# Patient Record
Sex: Female | Born: 1955 | Race: White | Hispanic: No | Marital: Married | Smoking: Never smoker
Health system: Southern US, Community
[De-identification: ages and names within clinical notes are randomized; demographics above are authoritative.]

## PROBLEM LIST (undated history)

## (undated) DIAGNOSIS — F39 Unspecified mood [affective] disorder: Secondary | ICD-10-CM

## (undated) DIAGNOSIS — T7840XA Allergy, unspecified, initial encounter: Secondary | ICD-10-CM

## (undated) DIAGNOSIS — R0683 Snoring: Secondary | ICD-10-CM

## (undated) DIAGNOSIS — R7989 Other specified abnormal findings of blood chemistry: Secondary | ICD-10-CM

## (undated) DIAGNOSIS — E785 Hyperlipidemia, unspecified: Secondary | ICD-10-CM

## (undated) DIAGNOSIS — F32A Depression, unspecified: Secondary | ICD-10-CM

## (undated) DIAGNOSIS — F419 Anxiety disorder, unspecified: Secondary | ICD-10-CM

## (undated) DIAGNOSIS — R945 Abnormal results of liver function studies: Secondary | ICD-10-CM

## (undated) DIAGNOSIS — R51 Headache: Secondary | ICD-10-CM

## (undated) DIAGNOSIS — D649 Anemia, unspecified: Secondary | ICD-10-CM

## (undated) DIAGNOSIS — Z3400 Encounter for supervision of normal first pregnancy, unspecified trimester: Secondary | ICD-10-CM

## (undated) DIAGNOSIS — K760 Fatty (change of) liver, not elsewhere classified: Secondary | ICD-10-CM

## (undated) DIAGNOSIS — M1990 Unspecified osteoarthritis, unspecified site: Secondary | ICD-10-CM

## (undated) DIAGNOSIS — G4730 Sleep apnea, unspecified: Secondary | ICD-10-CM

## (undated) DIAGNOSIS — F329 Major depressive disorder, single episode, unspecified: Secondary | ICD-10-CM

## (undated) HISTORY — DX: Encounter for supervision of normal first pregnancy, unspecified trimester: Z34.00

## (undated) HISTORY — DX: Other specified abnormal findings of blood chemistry: R79.89

## (undated) HISTORY — DX: Sleep apnea, unspecified: G47.30

## (undated) HISTORY — DX: Snoring: R06.83

## (undated) HISTORY — DX: Fatty (change of) liver, not elsewhere classified: K76.0

## (undated) HISTORY — DX: Headache: R51

## (undated) HISTORY — DX: Hyperlipidemia, unspecified: E78.5

## (undated) HISTORY — DX: Unspecified osteoarthritis, unspecified site: M19.90

## (undated) HISTORY — DX: Depression, unspecified: F32.A

## (undated) HISTORY — DX: Unspecified mood (affective) disorder: F39

## (undated) HISTORY — DX: Major depressive disorder, single episode, unspecified: F32.9

## (undated) HISTORY — DX: Anxiety disorder, unspecified: F41.9

## (undated) HISTORY — DX: Anemia, unspecified: D64.9

## (undated) HISTORY — PX: TIBIA FRACTURE SURGERY: SHX806

## (undated) HISTORY — DX: Allergy, unspecified, initial encounter: T78.40XA

## (undated) HISTORY — DX: Abnormal results of liver function studies: R94.5

---

## 1993-08-28 HISTORY — PX: BREAST SURGERY: SHX581

## 1999-01-07 ENCOUNTER — Encounter: Payer: Self-pay | Admitting: Internal Medicine

## 2000-03-05 ENCOUNTER — Other Ambulatory Visit: Admission: RE | Admit: 2000-03-05 | Discharge: 2000-03-05 | Payer: Self-pay | Admitting: Internal Medicine

## 2003-10-07 ENCOUNTER — Other Ambulatory Visit: Admission: RE | Admit: 2003-10-07 | Discharge: 2003-10-07 | Payer: Self-pay | Admitting: Internal Medicine

## 2005-01-27 ENCOUNTER — Ambulatory Visit: Payer: Self-pay | Admitting: Internal Medicine

## 2006-03-05 ENCOUNTER — Ambulatory Visit: Payer: Self-pay | Admitting: Internal Medicine

## 2006-03-08 ENCOUNTER — Ambulatory Visit: Payer: Self-pay | Admitting: Internal Medicine

## 2006-03-19 ENCOUNTER — Ambulatory Visit: Payer: Self-pay | Admitting: Internal Medicine

## 2006-04-18 ENCOUNTER — Ambulatory Visit: Payer: Self-pay | Admitting: Internal Medicine

## 2006-05-18 ENCOUNTER — Ambulatory Visit: Payer: Self-pay | Admitting: Internal Medicine

## 2006-07-11 ENCOUNTER — Ambulatory Visit: Payer: Self-pay | Admitting: Internal Medicine

## 2006-08-15 ENCOUNTER — Ambulatory Visit: Payer: Self-pay | Admitting: Internal Medicine

## 2006-11-07 ENCOUNTER — Ambulatory Visit: Payer: Self-pay | Admitting: Internal Medicine

## 2006-11-07 LAB — CONVERTED CEMR LAB
Cholesterol: 246 mg/dL (ref 0–200)
Direct LDL: 159.5 mg/dL
Total CHOL/HDL Ratio: 4.1

## 2006-11-14 ENCOUNTER — Ambulatory Visit: Payer: Self-pay | Admitting: Internal Medicine

## 2007-01-16 ENCOUNTER — Ambulatory Visit: Payer: Self-pay | Admitting: Internal Medicine

## 2007-01-16 LAB — CONVERTED CEMR LAB
AST: 24 units/L (ref 0–37)
Cholesterol: 247 mg/dL (ref 0–200)
Direct LDL: 146.9 mg/dL

## 2007-01-23 ENCOUNTER — Ambulatory Visit: Payer: Self-pay | Admitting: Internal Medicine

## 2007-03-06 ENCOUNTER — Encounter: Payer: Self-pay | Admitting: Internal Medicine

## 2007-03-06 DIAGNOSIS — J309 Allergic rhinitis, unspecified: Secondary | ICD-10-CM | POA: Insufficient documentation

## 2007-03-06 DIAGNOSIS — R519 Headache, unspecified: Secondary | ICD-10-CM | POA: Insufficient documentation

## 2007-03-06 DIAGNOSIS — E785 Hyperlipidemia, unspecified: Secondary | ICD-10-CM | POA: Insufficient documentation

## 2007-03-06 DIAGNOSIS — R51 Headache: Secondary | ICD-10-CM | POA: Insufficient documentation

## 2007-05-15 ENCOUNTER — Ambulatory Visit: Payer: Self-pay | Admitting: Internal Medicine

## 2007-05-15 LAB — CONVERTED CEMR LAB
AST: 22 units/L (ref 0–37)
BUN: 10 mg/dL (ref 6–23)
CO2: 27 meq/L (ref 19–32)
Calcium: 9.3 mg/dL (ref 8.4–10.5)
Direct LDL: 157.6 mg/dL
GFR calc Af Amer: 98 mL/min
Potassium: 4.5 meq/L (ref 3.5–5.1)
Triglycerides: 116 mg/dL (ref 0–149)
VLDL: 23 mg/dL (ref 0–40)

## 2007-05-22 ENCOUNTER — Ambulatory Visit: Payer: Self-pay | Admitting: Internal Medicine

## 2007-05-22 DIAGNOSIS — M1289 Other specific arthropathies, not elsewhere classified, multiple sites: Secondary | ICD-10-CM

## 2007-07-09 ENCOUNTER — Encounter: Payer: Self-pay | Admitting: Internal Medicine

## 2007-07-31 DIAGNOSIS — E559 Vitamin D deficiency, unspecified: Secondary | ICD-10-CM | POA: Insufficient documentation

## 2007-11-19 ENCOUNTER — Ambulatory Visit: Payer: Self-pay | Admitting: Internal Medicine

## 2007-11-19 DIAGNOSIS — F4322 Adjustment disorder with anxiety: Secondary | ICD-10-CM

## 2007-11-19 DIAGNOSIS — M1990 Unspecified osteoarthritis, unspecified site: Secondary | ICD-10-CM | POA: Insufficient documentation

## 2007-11-19 DIAGNOSIS — J019 Acute sinusitis, unspecified: Secondary | ICD-10-CM

## 2008-05-15 ENCOUNTER — Ambulatory Visit: Payer: Self-pay | Admitting: Internal Medicine

## 2008-05-15 LAB — CONVERTED CEMR LAB
ALT: 21 units/L (ref 0–35)
Bilirubin, Direct: 0.1 mg/dL (ref 0.0–0.3)
CO2: 27 meq/L (ref 19–32)
Calcium: 9.2 mg/dL (ref 8.4–10.5)
Cholesterol: 241 mg/dL (ref 0–200)
Creatinine, Ser: 0.9 mg/dL (ref 0.4–1.2)
HDL: 59.1 mg/dL (ref >39.0)
Total CHOL/HDL Ratio: 4.1
Total Protein: 7.3 g/dL (ref 6.0–8.3)
Triglycerides: 116 mg/dL (ref 0–149)
Vit D, 1,25-Dihydroxy: 92 — ABNORMAL HIGH (ref 30–89)

## 2008-05-22 ENCOUNTER — Ambulatory Visit: Payer: Self-pay | Admitting: Internal Medicine

## 2008-11-11 ENCOUNTER — Telehealth: Payer: Self-pay | Admitting: Internal Medicine

## 2008-11-16 ENCOUNTER — Encounter: Payer: Self-pay | Admitting: Internal Medicine

## 2008-12-28 ENCOUNTER — Ambulatory Visit: Payer: Self-pay | Admitting: Internal Medicine

## 2009-07-01 ENCOUNTER — Ambulatory Visit: Payer: Self-pay | Admitting: Internal Medicine

## 2009-07-01 LAB — CONVERTED CEMR LAB
AST: 26 units/L (ref 0–37)
Alkaline Phosphatase: 133 units/L — ABNORMAL HIGH (ref 39–117)
Basophils Absolute: 0.1 10*3/uL (ref 0.0–0.1)
Basophils Relative: 0.9 % (ref 0.0–3.0)
Bilirubin Urine: NEGATIVE
Chloride: 108 meq/L (ref 96–112)
Cholesterol: 208 mg/dL — ABNORMAL HIGH (ref 0–200)
Eosinophils Relative: 4.4 % (ref 0.0–5.0)
HCT: 36.8 % (ref 36.0–46.0)
Hemoglobin: 12.4 g/dL (ref 12.0–15.0)
Ketones, urine, test strip: NEGATIVE
Lymphs Abs: 2.3 10*3/uL (ref 0.7–4.0)
MCV: 87 fL (ref 78.0–100.0)
Nitrite: NEGATIVE
Potassium: 3.7 meq/L (ref 3.5–5.1)
Protein, U semiquant: NEGATIVE
RBC: 4.22 M/uL (ref 3.87–5.11)
Sodium: 143 meq/L (ref 135–145)
Total Bilirubin: 0.5 mg/dL (ref 0.3–1.2)
Total CHOL/HDL Ratio: 4
Urobilinogen, UA: 0.2
VLDL: 26.8 mg/dL (ref 0.0–40.0)
WBC: 5.8 10*3/uL (ref 4.5–10.5)

## 2009-07-13 ENCOUNTER — Ambulatory Visit: Payer: Self-pay | Admitting: Internal Medicine

## 2009-07-13 ENCOUNTER — Encounter: Payer: Self-pay | Admitting: Internal Medicine

## 2009-07-13 ENCOUNTER — Other Ambulatory Visit: Admission: RE | Admit: 2009-07-13 | Discharge: 2009-07-13 | Payer: Self-pay | Admitting: Internal Medicine

## 2009-07-13 DIAGNOSIS — R748 Abnormal levels of other serum enzymes: Secondary | ICD-10-CM | POA: Insufficient documentation

## 2009-07-13 DIAGNOSIS — J358 Other chronic diseases of tonsils and adenoids: Secondary | ICD-10-CM | POA: Insufficient documentation

## 2009-07-13 DIAGNOSIS — M79609 Pain in unspecified limb: Secondary | ICD-10-CM

## 2009-07-14 ENCOUNTER — Telehealth: Payer: Self-pay | Admitting: *Deleted

## 2009-07-14 ENCOUNTER — Encounter (INDEPENDENT_AMBULATORY_CARE_PROVIDER_SITE_OTHER): Payer: Self-pay | Admitting: *Deleted

## 2009-07-15 ENCOUNTER — Ambulatory Visit: Payer: Self-pay | Admitting: Internal Medicine

## 2009-07-19 ENCOUNTER — Encounter (INDEPENDENT_AMBULATORY_CARE_PROVIDER_SITE_OTHER): Payer: Self-pay | Admitting: *Deleted

## 2009-07-26 ENCOUNTER — Ambulatory Visit: Payer: Self-pay | Admitting: Internal Medicine

## 2009-08-26 ENCOUNTER — Ambulatory Visit: Payer: Self-pay | Admitting: Internal Medicine

## 2009-08-26 LAB — CONVERTED CEMR LAB
GGT: 78 units/L — ABNORMAL HIGH (ref 7–51)
Vit D, 25-Hydroxy: 30 ng/mL (ref 30–89)

## 2009-09-01 LAB — CONVERTED CEMR LAB
AST: 43 units/L — ABNORMAL HIGH (ref 0–37)
Albumin: 3.8 g/dL (ref 3.5–5.2)
Alkaline Phosphatase: 140 units/L — ABNORMAL HIGH (ref 39–117)

## 2009-09-06 ENCOUNTER — Encounter: Admission: RE | Admit: 2009-09-06 | Discharge: 2009-09-06 | Payer: Self-pay | Admitting: Internal Medicine

## 2009-12-13 ENCOUNTER — Ambulatory Visit: Payer: Self-pay | Admitting: Internal Medicine

## 2010-01-10 LAB — CONVERTED CEMR LAB
ALT: 52 units/L — ABNORMAL HIGH (ref 0–35)
AST: 43 units/L — ABNORMAL HIGH (ref 0–37)
Bilirubin, Direct: 0 mg/dL (ref 0.0–0.3)
Total Bilirubin: 0.2 mg/dL — ABNORMAL LOW (ref 0.3–1.2)
Total Protein: 7.6 g/dL (ref 6.0–8.3)

## 2010-02-01 ENCOUNTER — Ambulatory Visit: Payer: Self-pay | Admitting: Internal Medicine

## 2010-02-01 DIAGNOSIS — E669 Obesity, unspecified: Secondary | ICD-10-CM

## 2010-02-01 DIAGNOSIS — R0609 Other forms of dyspnea: Secondary | ICD-10-CM

## 2010-02-01 DIAGNOSIS — R0989 Other specified symptoms and signs involving the circulatory and respiratory systems: Secondary | ICD-10-CM

## 2010-02-01 DIAGNOSIS — R945 Abnormal results of liver function studies: Secondary | ICD-10-CM | POA: Insufficient documentation

## 2010-09-25 LAB — CONVERTED CEMR LAB
Bilirubin Urine: NEGATIVE
Protein, U semiquant: NEGATIVE

## 2010-09-29 NOTE — Assessment & Plan Note (Signed)
Summary: fu on meds/lab work/njr   Vital Signs:  Patient profile:   55 year old female Menstrual status:  postmenopausal Height:      64.5 inches Weight:      218 pounds BMI:     36.97 Pulse rate:   88 / minute BP sitting:   110 / 60  (left arm) Cuff size:   large  Vitals Entered By: Romualdo Bolk, CMA (AAMA) (February 01, 2010 2:13 PM) CC: Follow-up visit on meds   History of Present Illness: Audrey Jenkins comes in today  for follow up of multiple medical problems .  MOOD: taking meds and needs rfill of lexapro and wellbutrin now has a job  and feels better  Teaching [employer]   Obesity and fatty liver : plans  onexercising.      diet up and down.  LIPIDS: no se of meds  Allergic rhinitis  other ent signs :  needs refill on nasones as this is helpful . Has seen ent in the past year. HAs: stable   Preventive Screening-Counseling & Management  Alcohol-Tobacco     Alcohol drinks/day: 0     Smoking Status: never     Passive Smoke Exposure: no  Caffeine-Diet-Exercise     Caffeine use/day: 5 +     Does Patient Exercise: yes     Type of exercise: walking  Current Medications (verified): 1)  Budeprion Xl 300 Mg Tb24 (Bupropion Hcl) .Marland Kitchen.. 1 By Mouth Once Daily 2)  Lipitor 80 Mg  Tabs (Atorvastatin Calcium) .Marland Kitchen.. 1 By Mouth Once Daily 3)  Relpax 40 Mg Tabs (Eletriptan Hydrobromide) .Marland Kitchen.. 1 By Mouth As Needed For Ha. Can Repeat in 2-4 Hours 4)  Lexapro 10 Mg  Tabs (Escitalopram Oxalate) .Marland Kitchen.. 1 By Mouth Once Daily 5)  Nasonex 50 Mcg/act Susp (Mometasone Furoate) .... 2 Sprays Each Nares Q D 6)  Frova 2.5 Mg Tabs (Frovatriptan Succinate)  Allergies (verified): 1)  ! Flonase (Fluticasone Propionate) 2)  ! Darvocet A500 3)  ! Zyrtec Allergy (Cetirizine Hcl) 4)  ! Pravachol  Past History:  Past medical, surgical, family and social histories (including risk factors) reviewed, and no changes noted (except as noted below).  Past Medical History: Allergic  rhinitis Headache Mood Hyperlipidemia  Inital 400s  Osteoarthritis   Primiparous Treatment for depression Fatty liver on [place] Jan 2011 abnormal lfts  Snoring  [facility] Jan 2011  nasal obstruction and  cryptic tonsils  Past Surgical History: Reviewed history from 07/13/2009 and no changes required. Right Breast Biopsy 1995  Past History:  Care Management: None Current ENT   [facility]  Family History: Reviewed history from 07/13/2009 and no changes required. sister had sleep apnea and improved with weight loss   Father: Mi 79  cabg ht chf   died 49  Mother: Endometrial and skin cancer .  DJD . Osteoporosis and dementia. , COPD age 64.  Siblings: SIS  older CAD  vasculitis,  other sis DJD.  MGP  coronary artery disease   Social History: Reviewed history from 07/13/2009 and no changes required. Never Smoked Married   JOb at [employer] ends ofin JUne 10  RIF    now with new job.  [facility] H[facility] of 2   pet kittens. Works with [employer].    Review of Systems  The patient denies anorexia, fever, weight loss, vision loss, decreased hearing, hoarseness, chest pain, syncope, dyspnea on exertion, peripheral edema, prolonged cough, abdominal pain, melena, hematochezia, hematuria,  muscle weakness, transient blindness, difficulty walking, abnormal bleeding, enlarged lymph nodes, and angioedema.    Physical Exam  General:  Well-developed,well-nourished,in no acute distress; alert,appropriate and cooperative throughout examination Head:  normocephalic and atraumatic.   Eyes:  vision grossly intact, pupils equal, and pupils round.   Neck:  No deformities, masses, or tenderness noted. Lungs:  Normal respiratory effort, chest expands symmetrically. Lungs are clear to auscultation, no crackles or wheezes.no dullness.   Heart:  Normal rate and regular rhythm. S1 and S2 normal without gallop, murmur, click, rub or other extra sounds.no lifts.   Abdomen:  soft, non-tender, no  hepatomegaly, and no splenomegaly.  no distention, no masses, and no guarding.   Msk:  no joint warmth and no redness over joints.   Pulses:  pulses intact without delay   Extremities:  no clubbing cyanosis or edema  Neurologic:  alert & oriented X3 and gait normal.   Skin:  turgor normal, color normal, no ecchymoses, and no petechiae.   Cervical Nodes:  No lymphadenopathy noted Psych:  Oriented X3, normally interactive, good eye contact, not anxious appearing, and not depressed appearing.  brighter affect    Impression & Recommendations:  Problem # 1:  LIVER FUNCTION TESTS, ABNORMAL (ICD-794.8)  ultrasound   CW Fatty liver    counseled   labs . 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  Problem # 4:  HYPERLIPIDEMIA (ICD-272.4) pre rx  was in the 400 range  Her updated medication list for this problem includes:    Lipitor 80 Mg Tabs (Atorvastatin calcium) .Marland Kitchen... 1 by mouth once daily  Labs Reviewed: SGOT: 43 (12/13/2009)   SGPT: 52 (12/13/2009)  Lipid Goals: Chol Goal: 200 (05/22/2007)   HDL Goal: 40 (05/22/2007)   LDL Goal: 160 (05/22/2007)   TG Goal: 150 (05/22/2007)  Prior 10 Yr Risk Heart Disease: Not enough information (05/22/2007)   HDL:54.60 (07/01/2009), 59.1 (05/15/2008)  LDL:DEL (05/15/2008), DEL (05/15/2007)  Chol:208 (07/01/2009), 241 (05/15/2008)  Trig:134.0 (07/01/2009), 116 (05/15/2008)  Problem # 5:  OBESITY (ICD-278.00)  counseled   Ht: 64.5 (02/01/2010)   Wt: 218 (02/01/2010)   BMI: 36.97 (02/01/2010)  Problem # 6:  ALLERGIC RHINITIS (ICD-477.9) continue meds   Her updated medication list for this problem includes:    Nasonex 50 Mcg/act Susp (Mometasone furoate) .Marland Kitchen... 2 sprays each nares q d  Complete Medication  List: 1)  Budeprion Xl 300 Mg Tb24 (Bupropion hcl) .Marland Kitchen.. 1 by mouth once daily 2)  Lipitor 80 Mg Tabs (Atorvastatin calcium) .Marland Kitchen.. 1 by mouth once daily 3)  Relpax 40 Mg Tabs (Eletriptan hydrobromide) .Marland Kitchen.. 1 by mouth as needed for ha. can repeat in 2-4 hours 4)  Lexapro 10 Mg Tabs (Escitalopram oxalate) .Marland Kitchen.. 1 by mouth once daily 5)  Nasonex 50 Mcg/act Susp (Mometasone furoate) .... 2 sprays each nares q d 6)  Frova 2.5 Mg Tabs (Frovatriptan succinate)  Patient Instructions: 1)  agree that  weight   loss will help your  fatty liver and overall health. 2)  Get sleep exercise   and  weight watchers may help. 3)  Recorde eating  sleep patterns over a few weeks  to help with behaviour change . 4)  Schedule CPX with labs  in  October  2011  5)  can try taking half does of lexaproi 5 mg per day )   after  3-4 weeks can consider taking every other day 5 mg   and stop  Prescriptions: NASONEX 50 MCG/ACT SUSP (MOMETASONE FUROATE) 2 sprays each nares q d  #1 x 6   Entered and Authorized by:   Madelin Headings MD   Signed by:   Madelin Headings MD on 02/01/2010   Method used:   Electronically to        [facility]  [address] [id number] (retail)       [address]       [place], [place]  [address]       Ph: [phone] or [phone]       Fax: [phone]   RxID:   [id number] LEXAPRO 10 MG  TABS (ESCITALOPRAM OXALATE) 1 by mouth once daily  #30 Tablet x 6   Entered and Authorized by:   Madelin Headings MD   Signed by:   Madelin Headings MD on 02/01/2010   Method used:   [facility]        [facility]  [id number] (retail[facility]       [address]       [place], [place]  [address]       Ph: [phone] or [phone]       Fax: [phone]   RxID:   [id number] BUDEPRION XL 300 MG TB24 (BUPROPION HCL) 1 by mouth once daily  #30 Tablet x 6   Entered and Authorized by:   Madelin Headings MD   Signed by:   Madelin Headings MD on 02/01/2010   Method used:   [facility]        [facility]  [id number]       [address]       [place], [place]  [address]       Ph: [phone] or [phone]       Fax: [phone]   RxID:   [id number]

## 2010-10-21 ENCOUNTER — Other Ambulatory Visit: Payer: Self-pay | Admitting: Internal Medicine

## 2010-10-21 NOTE — Telephone Encounter (Signed)
Rx sent to pharmacy and letter sent to pt. 

## 2010-10-24 ENCOUNTER — Other Ambulatory Visit: Payer: Self-pay | Admitting: Internal Medicine

## 2010-10-24 NOTE — Telephone Encounter (Signed)
I will deny everyone but the relpax.

## 2010-10-25 NOTE — Telephone Encounter (Signed)
Last note in June said to get cpx with labs in November  Please have  Her schedule this and refill all  meds x 1 month until she can be seen.

## 2010-10-25 NOTE — Telephone Encounter (Signed)
Letter sent.

## 2010-10-28 ENCOUNTER — Telehealth: Payer: Self-pay | Admitting: Internal Medicine

## 2010-10-28 NOTE — Telephone Encounter (Signed)
Pt was seen in 01-2010 at that time pt was suppose to sch for cpx/cpx labs for 05-2010. Pt stated doc was going to order extensive cpx labs. Please advise

## 2010-11-04 NOTE — Telephone Encounter (Signed)
CPX  labs plus GammaGGT , Antimitochondrial antibodies and ANA.  Dx   Elevated Alkaline phosphate Then CPX  .

## 2010-11-10 NOTE — Telephone Encounter (Signed)
lmom at wk# for pt to callback

## 2010-11-23 ENCOUNTER — Encounter: Payer: Self-pay | Admitting: Internal Medicine

## 2010-11-23 NOTE — Telephone Encounter (Signed)
PT IS [facility] FOR CPX LABS ON 11-25-2010

## 2010-11-25 ENCOUNTER — Ambulatory Visit (INDEPENDENT_AMBULATORY_CARE_PROVIDER_SITE_OTHER): Payer: BC Managed Care – PPO | Admitting: Internal Medicine

## 2010-11-25 ENCOUNTER — Encounter: Payer: Self-pay | Admitting: Internal Medicine

## 2010-11-25 VITALS — BP 100/70 | HR 60 | Ht 64.25 in | Wt 212.0 lb

## 2010-11-25 DIAGNOSIS — F4322 Adjustment disorder with anxiety: Secondary | ICD-10-CM

## 2010-11-25 DIAGNOSIS — Z0000 Encounter for general adult medical examination without abnormal findings: Secondary | ICD-10-CM

## 2010-11-25 DIAGNOSIS — R945 Abnormal results of liver function studies: Secondary | ICD-10-CM

## 2010-11-25 DIAGNOSIS — R748 Abnormal levels of other serum enzymes: Secondary | ICD-10-CM

## 2010-11-25 DIAGNOSIS — E785 Hyperlipidemia, unspecified: Secondary | ICD-10-CM

## 2010-11-25 LAB — CBC WITH DIFFERENTIAL/PLATELET
Basophils Relative: 0.7 % (ref 0.0–3.0)
Eosinophils Relative: 2.8 % (ref 0.0–5.0)
HCT: 36.2 % (ref 36.0–46.0)
Hemoglobin: 12.1 g/dL (ref 12.0–15.0)
Lymphs Abs: 2.3 10*3/uL (ref 0.7–4.0)
MCV: 86.3 fl (ref 78.0–100.0)
Monocytes Absolute: 0.5 10*3/uL (ref 0.1–1.0)
Monocytes Relative: 8 % (ref 3.0–12.0)
Platelets: 298 10*3/uL (ref 150.0–400.0)
RBC: 4.19 Mil/uL (ref 3.87–5.11)
WBC: 6 10*3/uL (ref 4.5–10.5)

## 2010-11-25 LAB — HEPATIC FUNCTION PANEL
ALT: 22 U/L (ref 0–35)
Total Bilirubin: 0.8 mg/dL (ref 0.3–1.2)
Total Protein: 7.4 g/dL (ref 6.0–8.3)

## 2010-11-25 LAB — LDL CHOLESTEROL, DIRECT: Direct LDL: 177.6 mg/dL

## 2010-11-25 LAB — BASIC METABOLIC PANEL
BUN: 13 mg/dL (ref 6–23)
CO2: 27 mEq/L (ref 19–32)
Chloride: 107 mEq/L (ref 96–112)
GFR: 64.57 mL/min (ref >60.00)
Glucose, Bld: 77 mg/dL (ref 70–99)
Potassium: 5 mEq/L (ref 3.5–5.1)
Sodium: 144 mEq/L (ref 135–145)

## 2010-11-25 LAB — LIPID PANEL
HDL: 66.3 mg/dL (ref >39.00)
Total CHOL/HDL Ratio: 4
VLDL: 20.6 mg/dL (ref 0.0–40.0)

## 2010-11-25 MED ORDER — ATORVASTATIN CALCIUM 80 MG PO TABS: 80.0000 mg | ORAL_TABLET | Freq: Every day | ORAL | Status: DC

## 2010-11-25 MED ORDER — BUPROPION HCL ER (XL) 150 MG PO TB24: 150.0000 mg | ORAL_TABLET | ORAL | Status: DC

## 2010-11-25 MED ORDER — ELETRIPTAN HYDROBROMIDE 40 MG PO TABS: 40.0000 mg | ORAL_TABLET | ORAL | Status: DC | PRN

## 2010-11-25 MED ORDER — MOMETASONE FUROATE 50 MCG/ACT NA SUSP: 2.0000 | Freq: Every day | NASAL | Status: DC

## 2010-11-25 MED ORDER — ESCITALOPRAM OXALATE 10 MG PO TABS: 10.0000 mg | ORAL_TABLET | Freq: Every day | ORAL | Status: DC

## 2010-11-25 NOTE — Progress Notes (Signed)
  Subjective:    Patient ID: Audrey Jenkins, female    DOB: 12/06/1955, 55 y.o.   MRN: [id number]  HPI  Patient comes in for med check she hasn't been seen in a year. She isn't doing badly and no major change in her health status however has had issues with her medications. Anxiety depression Tried to wean off med for depression  In August and felt unraveled  A bit.  And so went back on.   Felt anxious and  So went back go off.  Had a crying spell. Is doing better back on it although when she has a stressful day at work does tend to overeat. Weight obesity  had lost some weight and gained some of that back recently. Hyperlipidemia:  No side effects from medicine although when she exercises she does get some slight aching but thinks it might be from deconditioning.   Tonsil problem: Sob Dr. Ezzard Standing last year and was going to her tonsils removed but didn't have time with her new job. She has intermittent sore throats and some snoring and think she would do better if she goes ahead with doing the tonsillectomy. Allergic rhinitis: Stable need to refill medication  h a:  Less frequent but still needs a refill of the Relpax in case she needs it. Abnormal liver tests: Never had followup we'll repeat with extra today.   Review of Systems No cp sob. No changes in sleep has some joint aching at times with her knees but is generally doing well no chest pain shortness of breath major changes in vision or hearing.    Objective:   Physical Exam Well-developed well-nourished in no acute distress. HEENT is grossly normal Next without masses thyromegaly or bruit Chest:  Clear to A&P without wheezes rales or rhonchi CV:  S1-S2 no gallops or murmurs peripheral perfusion is normal Abdomen:  Sof,t normal bowel sounds without hepatosplenomegaly, no guarding rebound or masses no CVA tenderness No clubbing cyanosis or edema      Assessment & Plan:  Hyperlipidemia  had very high total cholesterol to start and is  on high-dose think she has no side effects we'll consider decreasing to 40 mg to avoid possible side effects over time. We'll see what her labs are first. Reactive mood disorder actually doing well had some relapse when tried to wean last year. Discussed options here we may decrease the Wellbutrin first because it tends to be more of an anxiety reaction and depression and anger from there. History of abnormal liver tests these with alkaline phosphatase recheck Headaches stable

## 2010-11-25 NOTE — Patient Instructions (Signed)
Change the Wellbutrin dose to 150 per day  After a month call about how you are doing and if ok may discontinue this and then eventually try to decrease to 5 mg of lexapro.   Consider weight watchers. Will notify you  of labs when available.  And then plan    follow up .

## 2010-11-27 ENCOUNTER — Encounter: Payer: Self-pay | Admitting: Internal Medicine

## 2010-11-27 NOTE — Assessment & Plan Note (Signed)
Fatty liver on Ultrasound  Jan 2011     On lipitor  High dose for LIPiDs of 400 range   No se otherwise  Will recheck

## 2010-11-27 NOTE — Assessment & Plan Note (Signed)
Originally in 64s  Family hx of CAD father MI at 38   Can consider dec to 40 mg if appropriate to dec se although seems to not have any.

## 2010-12-01 ENCOUNTER — Telehealth: Payer: Self-pay | Admitting: *Deleted

## 2010-12-01 NOTE — Telephone Encounter (Signed)
Message copied by Tor Netters on Thu Dec 01, 2010  1:06 PM ------      Message from: [facility] [place] [facility]      Created: Thu Dec 01, 2010 11:56 AM       Advise patient that her liver tests are improved although not totally normal.      Her cholesterol levels have gone up a bit.  She should intensify with healthy lifestyle.       However if it hasn't been tried before we may want to try Crestor to see if we get a better response on her cholesterol.      Would try 20 mg one by mouth daily but I do not know if her insurance covers the period      If she wants to try a change I would repeat her lipid tests in 2 months with liver tests.      Otherwise  recheck her lipid panel and LFTs in 6 months and then an office visit

## 2010-12-01 NOTE — Telephone Encounter (Signed)
Left message on machine to call back about results.Marland Kitchen

## 2010-12-06 NOTE — Telephone Encounter (Signed)
Left message to call back  

## 2010-12-06 NOTE — Telephone Encounter (Signed)
Pt aware of results. She is going to increase a healthy lifestyle for now and discuss this when she comes in for her cpx in May.

## 2011-01-25 ENCOUNTER — Encounter: Payer: Self-pay | Admitting: Internal Medicine

## 2011-01-25 ENCOUNTER — Ambulatory Visit (INDEPENDENT_AMBULATORY_CARE_PROVIDER_SITE_OTHER): Payer: BC Managed Care – PPO | Admitting: Internal Medicine

## 2011-01-25 VITALS — BP 120/80 | HR 60 | Ht 64.5 in | Wt 216.0 lb

## 2011-01-25 DIAGNOSIS — F4322 Adjustment disorder with anxiety: Secondary | ICD-10-CM

## 2011-01-25 DIAGNOSIS — Z0000 Encounter for general adult medical examination without abnormal findings: Secondary | ICD-10-CM

## 2011-01-25 DIAGNOSIS — J358 Other chronic diseases of tonsils and adenoids: Secondary | ICD-10-CM

## 2011-01-25 DIAGNOSIS — Z23 Encounter for immunization: Secondary | ICD-10-CM

## 2011-01-25 DIAGNOSIS — E785 Hyperlipidemia, unspecified: Secondary | ICD-10-CM

## 2011-01-25 DIAGNOSIS — R945 Abnormal results of liver function studies: Secondary | ICD-10-CM

## 2011-01-25 DIAGNOSIS — R748 Abnormal levels of other serum enzymes: Secondary | ICD-10-CM

## 2011-01-25 MED ORDER — BUPROPION HCL ER (XL) 150 MG PO TB24: 150.0000 mg | ORAL_TABLET | ORAL | Status: DC

## 2011-01-25 MED ORDER — FROVATRIPTAN SUCCINATE 2.5 MG PO TABS: 2.5000 mg | ORAL_TABLET | ORAL | Status: DC | PRN

## 2011-01-25 NOTE — Patient Instructions (Signed)
lifestyle intervention healthy eating and exercise .  No changing  meds for now except try the frova  again.  LFTS  In 4-6 months  Cpx with labs in a year .

## 2011-01-25 NOTE — Progress Notes (Signed)
Subjective:    Patient ID: Audrey Jenkins, female    DOB: 1955/09/22, 55 y.o.   MRN: [id number]  HPI Patient comes in for preventive visit and    multiple medical issues  No major change in health status since last visit .No injuries .   Helping with sick family member  LIPIDS: no se of meds . Could eat better  Mood stable on meds so far  Allergies: Stable  OA joint pains take meds prn. Hands   Review of Systems Intermittent throat infection has seen ent in past and plans tonsillectomy when possible. Neg cp sob no change gi gu . Rest neg.  Past Medical History  Diagnosis Date  . Allergy   . Depression   . Headache   . Mood disorder   . Hyperlipidemia     Inital 400's  . Osteoarthritis   . Primiparous   . Fatty liver     on [place] Jan 2011  . Abnormal LFTs   . Snoring     ENT [facility] Jan 2011 nasal obstruction and cryptic tonsils   Past Surgical History  Procedure Date  . Breast surgery 1995    bx    reports that she has never smoked. She does not have any smokeless tobacco history on file. She reports that she drinks alcohol. She reports that she does not use illicit drugs. family history includes Arthritis in her mother and sister; COPD in her mother; Coronary artery disease in her sister; Dementia in her mother; Endometrial cancer in her mother; Heart attack in her father; Heart disease in her father; Heart failure in her father; Hypertension in her father; Osteoporosis in her mother; Other in her mother; Skin cancer in her mother; Sleep apnea in her sister; and Vasculitis in her sister. Allergies  Allergen Reactions  . Cetirizine Hcl     REACTION: flu symptoms  . Fluticasone Propionate     REACTION: lesions in nose  . Pravastatin Sodium     REACTION: Diarrhea  . Propoxyphene N-Acetaminophen     REACTION: extreme nausea        Objective:   Physical Exam Physical Exam: Vital signs reviewed [id number] is a well-developed well-nourished alert cooperative  white female  who appears her stated age in no acute distress.  HEENT: normocephalic  traumatic , Eyes: PERRL EOM's full, conjunctiva clear, Nares: paten,t no deformity discharge or tenderness., Ears: no deformity EAC's clear TMs with normal landmarks. Mouth: clear OP, no lesions, edema.  Cryptic tonsils  Moist mucous membranes. Dentition in adequate repair. NECK: supple without masses, thyromegaly or bruits. CHEST/PULM:  Clear to auscultation and percussion breath sounds equal no wheeze , rales or rhonchi. No chest wall deformities or tenderness. Breast: normal by inspection . No dimpling, discharge, masses, tenderness or discharge . LN: no cervical axillary inguinal adenopathy CV: PMI is nondisplaced, S1 S2 no gallops, murmurs, rubs. Peripheral pulses are full without delay.No JVD .  ABDOMEN: Bowel sounds normal nontender  No guard or rebound, no hepato splenomegal no CVA tenderness.  No hernia. Extremtities:  No clubbing cyanosis or edema, no acute joint swelling or redness no focal atrophy oa changes in hands NEURO:  Oriented x3, cranial nerves 3-12 appear to be intact, no obvious focal weakness,gait within normal limits no abnormal reflexes or asymmetrical SKIN: No acute rashes normal turgor, color, no bruising or petechiae. PSYCH: Oriented, good eye contact, no obvious depression anxiety, cognition and judgment appear normal. Labs reviewed      Assessment &  Plan:  Preventive Health Care Counseled regarding healthy nutrition, exercise, sleep, injury prevention, calcium vit d and healthy weight .  LIPIDS   orig in 300- 400 range  Adequate reduction and has no se by hx  Mood stable OA meds as needed Abnormal lfts  Improved  Will follow .  Has hx of fatty liver on [place].  Obesity Recurrent throat infections  To get tonsillectomy when  convenient

## 2011-01-27 ENCOUNTER — Encounter: Payer: Self-pay | Admitting: Internal Medicine

## 2011-02-14 ENCOUNTER — Other Ambulatory Visit: Payer: Self-pay | Admitting: Internal Medicine

## 2011-06-20 ENCOUNTER — Other Ambulatory Visit: Payer: Self-pay | Admitting: Internal Medicine

## 2011-06-20 NOTE — Telephone Encounter (Signed)
Pls advise.  

## 2011-06-20 NOTE — Telephone Encounter (Signed)
Ok to refill for x 6 months

## 2011-07-19 ENCOUNTER — Other Ambulatory Visit (INDEPENDENT_AMBULATORY_CARE_PROVIDER_SITE_OTHER): Payer: BC Managed Care – PPO

## 2011-07-19 DIAGNOSIS — R945 Abnormal results of liver function studies: Secondary | ICD-10-CM

## 2011-07-19 DIAGNOSIS — R7989 Other specified abnormal findings of blood chemistry: Secondary | ICD-10-CM

## 2011-07-19 LAB — HEPATIC FUNCTION PANEL
Albumin: 4 g/dL (ref 3.5–5.2)
Total Protein: 7.6 g/dL (ref 6.0–8.3)

## 2011-08-01 NOTE — Progress Notes (Signed)
Quick Note:  Left message to call back. ______ 

## 2011-08-07 NOTE — Progress Notes (Signed)
Quick Note:  Left message to call back. ______ 

## 2011-08-08 NOTE — Progress Notes (Signed)
Quick Note:  Left message to call back. ______ 

## 2011-08-10 ENCOUNTER — Encounter: Payer: Self-pay | Admitting: *Deleted

## 2011-08-10 NOTE — Progress Notes (Signed)
Quick Note:    Letter sent to pt.  ______

## 2011-08-18 ENCOUNTER — Other Ambulatory Visit: Payer: Self-pay | Admitting: Internal Medicine

## 2011-11-03 ENCOUNTER — Telehealth: Payer: Self-pay | Admitting: *Deleted

## 2011-11-03 NOTE — Telephone Encounter (Signed)
There was a Ship broker for pt's relpax. It allowed her to get one extra refill on December 17, 2010.  [phone]- to call if you have any questions.

## 2011-12-24 ENCOUNTER — Other Ambulatory Visit: Payer: Self-pay | Admitting: Internal Medicine

## 2012-01-20 ENCOUNTER — Other Ambulatory Visit: Payer: Self-pay | Admitting: Internal Medicine

## 2012-01-24 ENCOUNTER — Other Ambulatory Visit (INDEPENDENT_AMBULATORY_CARE_PROVIDER_SITE_OTHER): Payer: BC Managed Care – PPO

## 2012-01-24 DIAGNOSIS — Z0000 Encounter for general adult medical examination without abnormal findings: Secondary | ICD-10-CM

## 2012-01-24 LAB — BASIC METABOLIC PANEL
BUN: 16 mg/dL (ref 6–23)
CO2: 28 mEq/L (ref 19–32)
Chloride: 107 mEq/L (ref 96–112)
Creatinine, Ser: 0.8 mg/dL (ref 0.4–1.2)
Potassium: 4.6 mEq/L (ref 3.5–5.1)

## 2012-01-24 LAB — HEPATIC FUNCTION PANEL
Albumin: 3.8 g/dL (ref 3.5–5.2)
Total Bilirubin: 0.6 mg/dL (ref 0.3–1.2)

## 2012-01-24 LAB — POCT URINALYSIS DIPSTICK
Bilirubin, UA: NEGATIVE
Ketones, UA: NEGATIVE
Protein, UA: NEGATIVE
Spec Grav, UA: 1.02
pH, UA: 7

## 2012-01-24 LAB — CBC WITH DIFFERENTIAL/PLATELET
Basophils Relative: 0.8 % (ref 0.0–3.0)
Eosinophils Relative: 2.7 % (ref 0.0–5.0)
HCT: 36.7 % (ref 36.0–46.0)
Hemoglobin: 12 g/dL (ref 12.0–15.0)
Lymphs Abs: 2.2 10*3/uL (ref 0.7–4.0)
MCV: 85.9 fl (ref 78.0–100.0)
Monocytes Absolute: 0.5 10*3/uL (ref 0.1–1.0)
Monocytes Relative: 7.6 % (ref 3.0–12.0)
RBC: 4.27 Mil/uL (ref 3.87–5.11)
WBC: 6.6 10*3/uL (ref 4.5–10.5)

## 2012-01-24 LAB — LIPID PANEL
Cholesterol: 215 mg/dL — ABNORMAL HIGH (ref 0–200)
HDL: 64.3 mg/dL (ref >39.00)
Triglycerides: 160 mg/dL — ABNORMAL HIGH (ref 0.0–149.0)

## 2012-01-31 ENCOUNTER — Ambulatory Visit (INDEPENDENT_AMBULATORY_CARE_PROVIDER_SITE_OTHER): Payer: BC Managed Care – PPO | Admitting: Internal Medicine

## 2012-01-31 ENCOUNTER — Encounter: Payer: Self-pay | Admitting: Internal Medicine

## 2012-01-31 ENCOUNTER — Other Ambulatory Visit (HOSPITAL_COMMUNITY)
Admission: RE | Admit: 2012-01-31 | Discharge: 2012-01-31 | Disposition: A | Discharge status: Home / Self Care | Payer: BC Managed Care – PPO | Source: Ambulatory Visit | Attending: Internal Medicine | Admitting: Internal Medicine

## 2012-01-31 VITALS — BP 118/76 | HR 69 | Temp 97.9°F | Ht 64.5 in | Wt 222.0 lb

## 2012-01-31 DIAGNOSIS — Z01419 Encounter for gynecological examination (general) (routine) without abnormal findings: Secondary | ICD-10-CM | POA: Insufficient documentation

## 2012-01-31 DIAGNOSIS — F4322 Adjustment disorder with anxiety: Secondary | ICD-10-CM

## 2012-01-31 DIAGNOSIS — Z0000 Encounter for general adult medical examination without abnormal findings: Secondary | ICD-10-CM

## 2012-01-31 DIAGNOSIS — M1990 Unspecified osteoarthritis, unspecified site: Secondary | ICD-10-CM

## 2012-01-31 DIAGNOSIS — E785 Hyperlipidemia, unspecified: Secondary | ICD-10-CM

## 2012-01-31 DIAGNOSIS — E669 Obesity, unspecified: Secondary | ICD-10-CM

## 2012-01-31 DIAGNOSIS — Z1159 Encounter for screening for other viral diseases: Secondary | ICD-10-CM | POA: Insufficient documentation

## 2012-01-31 MED ORDER — ATORVASTATIN CALCIUM 80 MG PO TABS: 80.0000 mg | ORAL_TABLET | Freq: Every day | ORAL | Status: DC

## 2012-01-31 MED ORDER — BUPROPION HCL ER (XL) 150 MG PO TB24: 150.0000 mg | ORAL_TABLET | ORAL | Status: DC

## 2012-01-31 MED ORDER — ESCITALOPRAM OXALATE 10 MG PO TABS: 10.0000 mg | ORAL_TABLET | Freq: Every day | ORAL | Status: DC

## 2012-01-31 NOTE — Progress Notes (Signed)
Subjective:    Patient ID: Audrey Jenkins, female    DOB: 1956-03-06, 56 y.o.   MRN: [id number]  HPI Patient comes in today for preventive visit and follow-up of medical issues. Update  history since  last visit: No major changes ; ,injury surgery or hospitalizations. hasn't lost weight or attendee to this.  LIPIDS no se of meds Joint pains takes med prn HAs stable ocass frova. Allergies  Stable on nasonex. Mood  Doing well on current lexapro and wellbutrin. Last pap 2010 no hx of abnormal and low risk and is menopausal.   Review of Systems ROS:  GEN/ HEENT: No fever, significant weight changes sweats headaches vision problems hearing changes, CV/ PULM; No chest pain shortness of breath cough, syncope,edema  change in exercise tolerance. GI /GU: No adominal pain, vomiting, change in bowel habits. No blood in the stool. No significant GU symptoms. SKIN/HEME: ,no acute skin rashes suspicious lesions or bleeding. No lymphadenopathy, nodules, masses.  NEURO/ PSYCH:  No neurologic signs such as weakness numbness. No depression anxiety. IMM/ Allergy: No unusual infections.  Allergy .   REST of 12 system review negative except as per HPI Past history family history social history reviewed in the electronic medical record. Outpatient Encounter Prescriptions as of 01/31/2012  Medication Sig Dispense Refill  . atorvastatin (LIPITOR) 80 MG tablet Take 1 tablet (80 mg total) by mouth daily.  30 tablet  11  . buPROPion (WELLBUTRIN XL) 150 MG 24 hr tablet Take 1 tablet (150 mg total) by mouth every morning.  30 tablet  11  . escitalopram (LEXAPRO) 10 MG tablet Take 1 tablet (10 mg total) by mouth daily.  30 tablet  11  . FROVA 2.5 MG tablet TAKE 1 TABLET BY MOUTH AS NEEDED FOR MIGRAINE, THEN MAY REPEAT AFTER 2 HRS IF NEEDED, MAX 3/24 HRS  8 tablet  2  . NASONEX 50 MCG/ACT nasal spray USE 2 SPRAYS IN NOSTRIL DAILY  17 g  4  . DISCONTD: atorvastatin (LIPITOR) 80 MG tablet TAKE 1 TABLET BY MOUTH EVERY  DAY  30 tablet  5  . DISCONTD: buPROPion (WELLBUTRIN XL) 150 MG 24 hr tablet Take 1 tablet (150 mg total) by mouth every morning.  30 tablet  11  . DISCONTD: buPROPion (WELLBUTRIN XL) 150 MG 24 hr tablet Take 150 mg by mouth every morning.      Marland Kitchen DISCONTD: escitalopram (LEXAPRO) 10 MG tablet TAKE 1 TABLET BY MOUTH DAILY.  30 tablet  0        Objective:   Physical Exam BP 118/76  Pulse 69  Temp(Src) 97.9 F (36.6 C) (Oral)  Ht 5' 4.5" (1.638 m)  Wt 222 lb (100.699 kg)  BMI 37.52 kg/m2  SpO2 98% Physical Exam: Vital signs reviewed [id number] is a well-developed well-nourished alert cooperative  white female who appears her stated age in no acute distress.  HEENT: normocephalic atraumatic , Eyes: PERRL EOM's full, conjunctiva clear, Nares: paten,t no deformity discharge or tenderness., Ears: no deformity EAC's clear TMs with normal landmarks. Mouth: clear OP, no lesions, edema.  Moist mucous membranes. Dentition in adequate repair. NECK: supple without masses, thyromegaly or bruits. CHEST/PULM:  Clear to auscultation and percussion breath sounds equal no wheeze , rales or rhonchi. No chest wall deformities or tenderness. CV: PMI is nondisplaced, S1 S2 no gallops, murmurs, rubs. Peripheral pulses are full without delay.No JVD .  Breast: normal by inspection . No dimpling, discharge, masses, tenderness or discharge . ABDOMEN: Bowel sounds normal  nontender  No guard or rebound, no hepato splenomegal no CVA tenderness.  No hernia. Extremtities:  No clubbing cyanosis or edema, no acute joint swelling or redness no focal atrophy NEURO:  Oriented x3, cranial nerves 3-12 appear to be intact, no obvious focal weakness,gait within normal limits no abnormal reflexes or asymmetrical SKIN: No acute rashes normal turgor, color, no bruising or petechiae. PSYCH: Oriented, good eye contact, no obvious depression anxiety, cognition and judgment appear normal. LN: no cervical axillary inguinal  adenopathy Pelvic: NL ext GU, labia clear without lesions or rash . Vagina no lesions .Cervix: clear  UTERUS: Neg CMT Adnexa:  clear no masses . PAP done  High risk HPV done rectal neg mass heme negative Lab Results  Component Value Date   WBC 6.6 01/24/2012   HGB 12.0 01/24/2012   HCT 36.7 01/24/2012   PLT 276.0 01/24/2012   GLUCOSE 87 01/24/2012   CHOL 215* 01/24/2012   TRIG 160.0* 01/24/2012   HDL 64.30 01/24/2012   LDLDIRECT 145.4 01/24/2012   ALT 20 01/24/2012   AST 19 01/24/2012   NA 142 01/24/2012   K 4.6 01/24/2012   CL 107 01/24/2012   CREATININE 0.8 01/24/2012   BUN 16 01/24/2012   CO2 28 01/24/2012   TSH 1.22 01/24/2012       Assessment & Plan:  Preventive Health Care Counseled regarding healthy nutrition, exercise, sleep, injury prevention, calcium vit d and healthy weight . Gyne exam. Counseled.  Obesity  contributing to medical problems; Healthy weight loss will help disease states. LIPIDS No se of meds   MOOD  doing well discussed option of weaning at this time it would be best to stay on this refill for a year call in the meantime if having problems or wishes to discuss going off medication. Allergic rhinitis stable

## 2012-01-31 NOTE — Patient Instructions (Addendum)
Continue medications as discussed. Will contact you about pap  If hpv neg can go to every 5 year pap.   Begin tracking oral intake and any exercise  Can use an app  Or written. This can help decrease  Caloric intake avoid processed foods and simple carbs.  3500 calories is the energy content of a pound of body weight .Must have a 3500 cal deficit to lose one pound . Thus decrease 500 calorie equivalent per day in food or drink intake / or exercise  for 7 days to lose one pound.    Hypertriglyceridemia  Diet for High blood levels of Triglycerides Most fats in food are triglycerides. Triglycerides in your blood are stored as fat in your body. High levels of triglycerides in your blood may put you at a greater risk for heart disease and stroke.  Normal triglyceride levels are less than 150 mg/dL. Borderline high levels are 150-199 mg/dl. High levels are 200 - 499 mg/dL, and very high triglyceride levels are greater than 500 mg/dL. The decision to treat high triglycerides is generally based on the level. For people with borderline or high triglyceride levels, treatment includes weight loss and exercise. Drugs are recommended for people with very high triglyceride levels. Many people who need treatment for high triglyceride levels have metabolic syndrome. This syndrome is a collection of disorders that often include: insulin resistance, high blood pressure, blood clotting problems, high cholesterol and triglycerides. TESTING PROCEDURE FOR TRIGLYCERIDES  You should not eat 4 hours before getting your triglycerides measured. The normal range of triglycerides is between 10 and 250 milligrams per deciliter (mg/dl). Some people may have extreme levels (1000 or above), but your triglyceride level may be too high if it is above 150 mg/dl, depending on what other risk factors you have for heart disease.   People with high blood triglycerides may also have high blood cholesterol levels. If you have high blood  cholesterol as well as high blood triglycerides, your risk for heart disease is probably greater than if you only had high triglycerides. High blood cholesterol is one of the main risk factors for heart disease.  CHANGING YOUR DIET  Your weight can affect your blood triglyceride level. If you are more than 20% above your ideal body weight, you may be able to lower your blood triglycerides by losing weight. Eating less and exercising regularly is the best way to combat this. Fat provides more calories than any other food. The best way to lose weight is to eat less fat. Only 30% of your total calories should come from fat. Less than 7% of your diet should come from saturated fat. A diet low in fat and saturated fat is the same as a diet to decrease blood cholesterol. By eating a diet lower in fat, you may lose weight, lower your blood cholesterol, and lower your blood triglyceride level.  Eating a diet low in fat, especially saturated fat, may also help you lower your blood triglyceride level. Ask your dietitian to help you figure how much fat you can eat based on the number of calories your caregiver has prescribed for you.  Exercise, in addition to helping with weight loss may also help lower triglyceride levels.   Alcohol can increase blood triglycerides. You may need to stop drinking alcoholic beverages.   Too much carbohydrate in your diet may also increase your blood triglycerides. Some complex carbohydrates are necessary in your diet. These may include bread, rice, potatoes, other starchy vegetables and cereals.  Reduce "simple" carbohydrates. These may include pure sugars, candy, honey, and jelly without losing other nutrients. If you have the kind of high blood triglycerides that is affected by the amount of carbohydrates in your diet, you will need to eat less sugar and less high-sugar foods. Your caregiver can help you with this.   Adding 2-4 grams of fish oil (EPA+ DHA) may also help lower  triglycerides. Speak with your caregiver before adding any supplements to your regimen.  Following the Diet  Maintain your ideal weight. Your caregivers can help you with a diet. Generally, eating less food and getting more exercise will help you lose weight. Joining a weight control group may also help. Ask your caregivers for a good weight control group in your area.  Eat low-fat foods instead of high-fat foods. This can help you lose weight too.  These foods are lower in fat. Eat MORE of these:   Dried beans, peas, and lentils.   Egg whites.   Low-fat cottage cheese.   Fish.   Lean cuts of meat, such as round, sirloin, rump, and flank (cut extra fat off meat you fix).   Whole grain breads, cereals and pasta.   Skim and nonfat dry milk.   Low-fat yogurt.   Poultry without the skin.   Cheese made with skim or part-skim milk, such as mozzarella, parmesan, farmers', ricotta, or pot cheese.  These are higher fat foods. Eat LESS of these:   Whole milk and foods made from whole milk, such as American, blue, cheddar, monterey jack, and swiss cheese   High-fat meats, such as luncheon meats, sausages, knockwurst, bratwurst, hot dogs, ribs, corned beef, ground pork, and regular ground beef.   Fried foods.  Limit saturated fats in your diet. Substituting unsaturated fat for saturated fat may decrease your blood triglyceride level. You will need to read package labels to know which products contain saturated fats.  These foods are high in saturated fat. Eat LESS of these:   Fried pork skins.   Whole milk.   Skin and fat from poultry.   Palm oil.   Butter.   Shortening.   Cream cheese.   Tomasa Blase.   Margarines and baked goods made from listed oils.   Vegetable shortenings.   Chitterlings.   Fat from meats.   Coconut oil.   Palm kernel oil.   Lard.   Cream.   Sour cream.   Fatback.   Coffee whiteners and non-dairy creamers made with these oils.   Cheese made  from whole milk.  Use unsaturated fats (both polyunsaturated and monounsaturated) moderately. Remember, even though unsaturated fats are better than saturated fats; you still want a diet low in total fat.  These foods are high in unsaturated fat:   Canola oil.   Sunflower oil.   Mayonnaise.   Almonds.   Peanuts.   Pine nuts.   Margarines made with these oils.   Safflower oil.   Olive oil.   Avocados.   Cashews.   Peanut butter.   Sunflower seeds.   Soybean oil.   Peanut oil.   Olives.   Pecans.   Walnuts.   Pumpkin seeds.  Avoid sugar and other high-sugar foods. This will decrease carbohydrates without decreasing other nutrients. Sugar in your food goes rapidly to your blood. When there is excess sugar in your blood, your liver may use it to make more triglycerides. Sugar also contains calories without other important nutrients.  Eat LESS of these:   Sugar,  brown sugar, powdered sugar, jam, jelly, preserves, honey, syrup, molasses, pies, candy, cakes, cookies, frosting, pastries, colas, soft drinks, punches, fruit drinks, and regular gelatin.   Avoid alcohol. Alcohol, even more than sugar, may increase blood triglycerides. In addition, alcohol is high in calories and low in nutrients. Ask for sparkling water, or a diet soft drink instead of an alcoholic beverage.  Suggestions for planning and preparing meals   Bake, broil, grill or roast meats instead of frying.   Remove fat from meats and skin from poultry before cooking.   Add spices, herbs, lemon juice or vinegar to vegetables instead of salt, rich sauces or gravies.   Use a non-stick skillet without fat or use no-stick sprays.   Cool and refrigerate stews and broth. Then remove the hardened fat floating on the surface before serving.   Refrigerate meat drippings and skim off fat to make low-fat gravies.   Serve more fish.   Use less butter, margarine and other high-fat spreads on bread or vegetables.    Use skim or reconstituted non-fat dry milk for cooking.   Cook with low-fat cheeses.   Substitute low-fat yogurt or cottage cheese for all or part of the sour cream in recipes for sauces, dips or congealed salads.   Use half yogurt/half mayonnaise in salad recipes.   Substitute evaporated skim milk for cream. Evaporated skim milk or reconstituted non-fat dry milk can be whipped and substituted for whipped cream in certain recipes.   Choose fresh fruits for dessert instead of high-fat foods such as pies or cakes. Fruits are naturally low in fat.  When Dining Out   Order low-fat appetizers such as fruit or vegetable juice, pasta with vegetables or tomato sauce.   Select clear, rather than cream soups.   Ask that dressings and gravies be served on the side. Then use less of them.   Order foods that are baked, broiled, poached, steamed, stir-fried, or roasted.   Ask for margarine instead of butter, and use only a small amount.   Drink sparkling water, unsweetened tea or coffee, or diet soft drinks instead of alcohol or other sweet beverages.  QUESTIONS AND ANSWERS ABOUT OTHER FATS IN THE BLOOD: SATURATED FAT, TRANS FAT, AND CHOLESTEROL What is trans fat? Trans fat is a type of fat that is formed when vegetable oil is hardened through a process called hydrogenation. This process helps makes foods more solid, gives them shape, and prolongs their shelf life. Trans fats are also called hydrogenated or partially hydrogenated oils.  What do saturated fat, trans fat, and cholesterol in foods have to do with heart disease? Saturated fat, trans fat, and cholesterol in the diet all raise the level of LDL "bad" cholesterol in the blood. The higher the LDL cholesterol, the greater the risk for coronary heart disease (CHD). Saturated fat and trans fat raise LDL similarly.  What foods contain saturated fat, trans fat, and cholesterol? High amounts of saturated fat are found in animal products, such  as fatty cuts of meat, chicken skin, and full-fat dairy products like butter, whole milk, cream, and cheese, and in tropical vegetable oils such as palm, palm kernel, and coconut oil. Trans fat is found in some of the same foods as saturated fat, such as vegetable shortening, some margarines (especially hard or stick margarine), crackers, cookies, baked goods, fried foods, salad dressings, and other processed foods made with partially hydrogenated vegetable oils. Small amounts of trans fat also occur naturally in some animal products, such as  milk products, beef, and lamb. Foods high in cholesterol include liver, other organ meats, egg yolks, shrimp, and full-fat dairy products. How can I use the new food label to make heart-healthy food choices? Check the Nutrition Facts panel of the food label. Choose foods lower in saturated fat, trans fat, and cholesterol. For saturated fat and cholesterol, you can also use the Percent Daily Value (%DV): 5% DV or less is low, and 20% DV or more is high. (There is no %DV for trans fat.) Use the Nutrition Facts panel to choose foods low in saturated fat and cholesterol, and if the trans fat is not listed, read the ingredients and limit products that list shortening or hydrogenated or partially hydrogenated vegetable oil, which tend to be high in trans fat. POINTS TO REMEMBER: YOU NEED A LITTLE TLC (THERAPEUTIC LIFESTYLE CHANGES)  Discuss your risk for heart disease with your caregivers, and take steps to reduce risk factors.   Change your diet. Choose foods that are low in saturated fat, trans fat, and cholesterol.   Add exercise to your daily routine if it is not already being done. Participate in physical activity of moderate intensity, like brisk walking, for at least 30 minutes on most, and preferably all days of the week. No time? Break the 30 minutes into three, 10-minute segments during the day.   Stop smoking. If you do smoke, contact your caregiver to discuss  ways in which they can help you quit.   Do not use street drugs.   Maintain a normal weight.   Maintain a healthy blood pressure.   Keep up with your blood work for checking the fats in your blood as directed by your caregiver.  Document Released: 06/01/2004 Document Revised: 08/03/2011 Document Reviewed: 12/28/2008 [facility] Patient Information 2012 [place], [place].

## 2012-02-07 NOTE — Progress Notes (Signed)
Quick Note:  Tell patient PAP is normal. HPV high risk is negative . Can do paps every 5 years if no problems ______

## 2012-02-21 ENCOUNTER — Other Ambulatory Visit: Payer: Self-pay | Admitting: Internal Medicine

## 2012-02-25 ENCOUNTER — Other Ambulatory Visit: Payer: Self-pay | Admitting: Internal Medicine

## 2012-02-26 NOTE — Telephone Encounter (Signed)
Pt was last here on 01/31/12, can we fill this?

## 2012-02-26 NOTE — Telephone Encounter (Signed)
Ok to refill x 12 months please send this in

## 2012-03-25 ENCOUNTER — Other Ambulatory Visit: Payer: Self-pay | Admitting: Internal Medicine

## 2012-03-28 ENCOUNTER — Other Ambulatory Visit: Payer: Self-pay | Admitting: Internal Medicine

## 2012-04-28 ENCOUNTER — Other Ambulatory Visit: Payer: Self-pay | Admitting: Internal Medicine

## 2012-11-06 ENCOUNTER — Other Ambulatory Visit: Payer: Self-pay | Admitting: Internal Medicine

## 2012-12-20 ENCOUNTER — Telehealth: Payer: Self-pay | Admitting: Internal Medicine

## 2012-12-20 NOTE — Telephone Encounter (Signed)
Caller: Quetzal/Patient; Phone: [phone]; Reason for Call: Patient needs prior authorization from Dr.  Fabian Sharp for her frova for migraine.  [place] [employer].  Patient case ID number is [id number], and the phone number is [phone].  [facility].  May reach patient at [phone].  Krs/can

## 2012-12-23 NOTE — Telephone Encounter (Signed)
Prior auth renewal request sent to [employer]. Encounter closed.

## 2012-12-24 NOTE — Telephone Encounter (Signed)
Prior auth approved. New Case ID: [id number]. Frova approved 12-03-12 through 12-24-13

## 2013-01-24 ENCOUNTER — Other Ambulatory Visit (INDEPENDENT_AMBULATORY_CARE_PROVIDER_SITE_OTHER): Payer: BC Managed Care – PPO

## 2013-01-24 DIAGNOSIS — Z0000 Encounter for general adult medical examination without abnormal findings: Secondary | ICD-10-CM

## 2013-01-24 LAB — CBC WITH DIFFERENTIAL/PLATELET
Basophils Absolute: 0 10*3/uL (ref 0.0–0.1)
Eosinophils Absolute: 0.2 10*3/uL (ref 0.0–0.7)
Hemoglobin: 11.5 g/dL — ABNORMAL LOW (ref 12.0–15.0)
Lymphocytes Relative: 32.2 % (ref 12.0–46.0)
MCHC: 33.5 g/dL (ref 30.0–36.0)
Monocytes Relative: 9.2 % (ref 3.0–12.0)
Neutro Abs: 3.5 10*3/uL (ref 1.4–7.7)
Neutrophils Relative %: 54.4 % (ref 43.0–77.0)
RBC: 4.19 Mil/uL (ref 3.87–5.11)
RDW: 15.1 % — ABNORMAL HIGH (ref 11.5–14.6)

## 2013-01-24 LAB — HEPATIC FUNCTION PANEL
AST: 22 U/L (ref 0–37)
Albumin: 3.7 g/dL (ref 3.5–5.2)
Alkaline Phosphatase: 118 U/L — ABNORMAL HIGH (ref 39–117)
Bilirubin, Direct: 0 mg/dL (ref 0.0–0.3)

## 2013-01-24 LAB — LIPID PANEL
HDL: 58.5 mg/dL (ref >39.00)
Total CHOL/HDL Ratio: 4
Triglycerides: 107 mg/dL (ref 0.0–149.0)
VLDL: 21.4 mg/dL (ref 0.0–40.0)

## 2013-01-24 LAB — BASIC METABOLIC PANEL
Calcium: 9.2 mg/dL (ref 8.4–10.5)
Creatinine, Ser: 0.9 mg/dL (ref 0.4–1.2)
GFR: 69 mL/min (ref >60.00)
Sodium: 141 mEq/L (ref 135–145)

## 2013-01-24 LAB — LDL CHOLESTEROL, DIRECT: Direct LDL: 142.9 mg/dL

## 2013-01-31 ENCOUNTER — Encounter: Payer: BC Managed Care – PPO | Admitting: Internal Medicine

## 2013-02-05 ENCOUNTER — Encounter: Payer: Self-pay | Admitting: Internal Medicine

## 2013-02-05 ENCOUNTER — Ambulatory Visit (INDEPENDENT_AMBULATORY_CARE_PROVIDER_SITE_OTHER): Payer: BC Managed Care – PPO | Admitting: Internal Medicine

## 2013-02-05 VITALS — BP 120/76 | HR 72 | Temp 98.1°F | Ht 64.0 in | Wt 217.0 lb

## 2013-02-05 DIAGNOSIS — M7710 Lateral epicondylitis, unspecified elbow: Secondary | ICD-10-CM

## 2013-02-05 DIAGNOSIS — F4322 Adjustment disorder with anxiety: Secondary | ICD-10-CM

## 2013-02-05 DIAGNOSIS — D649 Anemia, unspecified: Secondary | ICD-10-CM

## 2013-02-05 DIAGNOSIS — E785 Hyperlipidemia, unspecified: Secondary | ICD-10-CM

## 2013-02-05 DIAGNOSIS — Z0000 Encounter for general adult medical examination without abnormal findings: Secondary | ICD-10-CM

## 2013-02-05 DIAGNOSIS — M7711 Lateral epicondylitis, right elbow: Secondary | ICD-10-CM

## 2013-02-05 DIAGNOSIS — Z79899 Other long term (current) drug therapy: Secondary | ICD-10-CM

## 2013-02-05 MED ORDER — BUPROPION HCL ER (XL) 150 MG PO TB24: ORAL_TABLET | ORAL | Status: DC

## 2013-02-05 MED ORDER — MOMETASONE FUROATE 50 MCG/ACT NA SUSP: NASAL | Status: DC

## 2013-02-05 MED ORDER — ATORVASTATIN CALCIUM 80 MG PO TABS: ORAL_TABLET | ORAL | Status: DC

## 2013-02-05 MED ORDER — ESCITALOPRAM OXALATE 10 MG PO TABS: ORAL_TABLET | ORAL | Status: DC

## 2013-02-05 NOTE — Patient Instructions (Addendum)
Can try weaning wellbutrin  And see  How you do.  If doing ok then can do 5 mg lexapro for at least a month .     Continue   lipitor  At this time.    Exercise walking and fresh fruits and vegetables.    Do stool cards  And  Check blood count in  1-2 months  . Decide if needs fu depending on results        Lateral Epicondylitis (Tennis Elbow) with Rehab Lateral epicondylitis involves inflammation and pain around the outer portion of the elbow. The pain is caused by inflammation of the tendons in the forearm that bring back (extend) the wrist. Lateral epicondylittis is also called tennis elbow, because it is very common in tennis players. However, it may occur in any individual who extends the wrist repetitively. If lateral epicondylitis is left untreated, it may become a chronic problem. SYMPTOMS   Pain, tenderness, and inflammation on the outer (lateral) side of the elbow.  Pain or weakness with gripping activities.  Pain that increases with wrist twisting motions (playing tennis, using a screwdriver, opening a door or a jar).  Pain with lifting objects, including a coffee cup. CAUSES  Lateral epicondylitis is caused by inflammation of the tendons that extend the wrist. Causes of injury may include:  Repetitive stress and strain on the muscles and tendons that extend the wrist.  Sudden change in activity level or intensity.  Incorrect grip in racquet sports.  Incorrect grip size of racquet (often too large).  Incorrect hitting position or technique (usually backhand, leading with the elbow).  Using a racket that is too heavy. RISK INCREASES WITH:  Sports or occupations that require repetitive and/or strenuous forearm and wrist movements (tennis, squash, racquetball, carpentry).  Poor wrist and forearm strength and flexibility.  Failure to warm up properly before activity.  Resuming activity before healing, rehabilitation, and conditioning are complete. PREVENTION    Warm up and stretch properly before activity.  Maintain physical fitness:  Strength, flexibility, and endurance.  Cardiovascular fitness.  Wear and use properly fitted equipment.  Learn and use proper technique and have a coach correct improper technique.  Wear a tennis elbow (counterforce) brace. PROGNOSIS  The course of this condition depends on the degree of the injury. If treated properly, acute cases (symptoms lasting less than 4 weeks) are often resolved in 2 to 6 weeks. Chronic (longer lasting cases) often resolve in 3 to 6 months, but may require physical therapy. RELATED COMPLICATIONS   Frequently recurring symptoms, resulting in a chronic problem. Properly treating the problem the first time decreases frequency of recurrence.  Chronic inflammation, scarring tendon degeneration, and partial tendon tear, requiring surgery.  Delayed healing or resolution of symptoms. TREATMENT  Treatment first involves the use of ice and medicine, to reduce pain and inflammation. Strengthening and stretching exercises may help reduce discomfort, if performed regularly. These exercises may be performed at home, if the condition is an acute injury. Chronic cases may require a referral to a physical therapist for evaluation and treatment. Your caregiver may advise a corticosteroid injection, to help reduce inflammation. Rarely, surgery is needed. MEDICATION  If pain medicine is needed, nonsteroidal anti-inflammatory medicines (aspirin and ibuprofen), or other minor pain relievers (acetaminophen), are often advised.  Do not take pain medicine for 7 days before surgery.  Prescription pain relievers may be given, if your caregiver thinks they are needed. Use only as directed and only as much as you need.  Corticosteroid injections may be recommended. These injections should be reserved only for the most severe cases, because they can only be given a certain number of times. HEAT AND COLD  Cold  treatment (icing) should be applied for 10 to 15 minutes every 2 to 3 hours for inflammation and pain, and immediately after activity that aggravates your symptoms. Use ice packs or an ice massage.  Heat treatment may be used before performing stretching and strengthening activities prescribed by your caregiver, physical therapist, or athletic trainer. Use a heat pack or a warm water soak. SEEK MEDICAL CARE IF: Symptoms get worse or do not improve in 2 weeks, despite treatment. EXERCISES  RANGE OF MOTION (ROM) AND STRETCHING EXERCISES - Epicondylitis, Lateral (Tennis Elbow) These exercises may help you when beginning to rehabilitate your injury. Your symptoms may go away with or without further involvement from your physician, physical therapist or athletic trainer. While completing these exercises, remember:   Restoring tissue flexibility helps normal motion to return to the joints. This allows healthier, less painful movement and activity.  An effective stretch should be held for at least 30 seconds.  A stretch should never be painful. You should only feel a gentle lengthening or release in the stretched tissue. RANGE OF MOTION  Wrist Flexion, Active-Assisted  Extend your right / left elbow with your fingers pointing down.*  Gently pull the back of your hand towards you, until you feel a gentle stretch on the top of your forearm.  Hold this position for __________ seconds. Repeat __________ times. Complete this exercise __________ times per day.  *If directed by your physician, physical therapist or athletic trainer, complete this stretch with your elbow bent, rather than extended. RANGE OF MOTION  Wrist Extension, Active-Assisted  Extend your right / left elbow and turn your palm upwards.*  Gently pull your palm and fingertips back, so your wrist extends and your fingers point more toward the ground.  You should feel a gentle stretch on the inside of your forearm.  Hold this  position for __________ seconds. Repeat __________ times. Complete this exercise __________ times per day. *If directed by your physician, physical therapist or athletic trainer, complete this stretch with your elbow bent, rather than extended. STRETCH - Wrist Flexion  Place the back of your right / left hand on a tabletop, leaving your elbow slightly bent. Your fingers should point away from your body.  Gently press the back of your hand down onto the table by straightening your elbow. You should feel a stretch on the top of your forearm.  Hold this position for __________ seconds. Repeat __________ times. Complete this stretch __________ times per day.  STRETCH  Wrist Extension   Place your right / left fingertips on a tabletop, leaving your elbow slightly bent. Your fingers should point backwards.  Gently press your fingers and palm down onto the table by straightening your elbow. You should feel a stretch on the inside of your forearm.  Hold this position for __________ seconds. Repeat __________ times. Complete this stretch __________ times per day.  STRENGTHENING EXERCISES - Epicondylitis, Lateral (Tennis Elbow) These exercises may help you when beginning to rehabilitate your injury. They may resolve your symptoms with or without further involvement from your physician, physical therapist or athletic trainer. While completing these exercises, remember:   Muscles can gain both the endurance and the strength needed for everyday activities through controlled exercises.  Complete these exercises as instructed by your physician, physical therapist or athletic trainer.  Increase the resistance and repetitions only as guided.  You may experience muscle soreness or fatigue, but the pain or discomfort you are trying to eliminate should never worsen during these exercises. If this pain does get worse, stop and make sure you are following the directions exactly. If the pain is still present after  adjustments, discontinue the exercise until you can discuss the trouble with your caregiver. STRENGTH Wrist Flexors  Sit with your right / left forearm palm-up and fully supported on a table or countertop. Your elbow should be resting below the height of your shoulder. Allow your wrist to extend over the edge of the surface.  Loosely holding a __________ weight, or a piece of rubber exercise band or tubing, slowly curl your hand up toward your forearm.  Hold this position for __________ seconds. Slowly lower the wrist back to the starting position in a controlled manner. Repeat __________ times. Complete this exercise __________ times per day.  STRENGTH  Wrist Extensors  Sit with your right / left forearm palm-down and fully supported on a table or countertop. Your elbow should be resting below the height of your shoulder. Allow your wrist to extend over the edge of the surface.  Loosely holding a __________ weight, or a piece of rubber exercise band or tubing, slowly curl your hand up toward your forearm.  Hold this position for __________ seconds. Slowly lower the wrist back to the starting position in a controlled manner. Repeat __________ times. Complete this exercise __________ times per day.  STRENGTH - Ulnar Deviators  Stand with a ____________________ weight in your right / left hand, or sit while holding a rubber exercise band or tubing, with your healthy arm supported on a table or countertop.  Move your wrist, so that your pinkie travels toward your forearm and your thumb moves away from your forearm.  Hold this position for __________ seconds and then slowly lower the wrist back to the starting position. Repeat __________ times. Complete this exercise __________ times per day STRENGTH - Radial Deviators  Stand with a ____________________ weight in your right / left hand, or sit while holding a rubber exercise band or tubing, with your injured arm supported on a table or  countertop.  Raise your hand upward in front of you or pull up on the rubber tubing.  Hold this position for __________ seconds and then slowly lower the wrist back to the starting position. Repeat __________ times. Complete this exercise __________ times per day. STRENGTH  Forearm Supinators   Sit with your right / left forearm supported on a table, keeping your elbow below shoulder height. Rest your hand over the edge, palm down.  Gently grip a hammer or a soup ladle.  Without moving your elbow, slowly turn your palm and hand upward to a "thumbs-up" position.  Hold this position for __________ seconds. Slowly return to the starting position. Repeat __________ times. Complete this exercise __________ times per day.  STRENGTH  Forearm Pronators   Sit with your right / left forearm supported on a table, keeping your elbow below shoulder height. Rest your hand over the edge, palm up.  Gently grip a hammer or a soup ladle.  Without moving your elbow, slowly turn your palm and hand upward to a "thumbs-up" position.  Hold this position for __________ seconds. Slowly return to the starting position. Repeat __________ times. Complete this exercise __________ times per day.  STRENGTH - Grip  Grasp a tennis ball, a dense sponge, or a  large, rolled sock in your hand.  Squeeze as hard as you can, without increasing any pain.  Hold this position for __________ seconds. Release your grip slowly. Repeat __________ times. Complete this exercise __________ times per day.  STRENGTH - Elbow Extensors, Isometric  Stand or sit upright, on a firm surface. Place your right / left arm so that your palm faces your stomach, and it is at the height of your waist.  Place your opposite hand on the underside of your forearm. Gently push up as your right / left arm resists. Push as hard as you can with both arms, without causing any pain or movement at your right / left elbow. Hold this stationary position  for __________ seconds. Gradually release the tension in both arms. Allow your muscles to relax completely before repeating. Document Released: 08/14/2005 Document Revised: 11/06/2011 Document Reviewed: 11/26/2008 [facility] Patient Information 2014 [place], [place].

## 2013-02-05 NOTE — Progress Notes (Signed)
Chief Complaint  Patient presents with  . Annual Exam    HPI: Patient comes in today for Preventive Health Care visit  No major change in health status since last visit .  Got some elbow left tendinitis from painting only now getting better  Sore at times   Lipids ;ok no se of meds.  Has been on since young when levels were in the 400s range and fam hx   ? If co q 10 advise  No se of meds    wellbutrin  lexapro :  Doing ok.   Asks about weaning stopping . Mood is good life is stable   Oral surgery for dental implant  Otherwise no  Surgery. Since last visit   Has ok . Working [employer] still  Has summer off  [employer] rying to [employer] and exercise " no excuses"  No bleeding bruising blood donation . No periods.   declining mammo  Had a barbaric  biopsy done for calcification in the past and feels traumatized by this  . Willing to take risk of no screening ROS:  GEN/ HEENT: No fever, significant weight changes sweats headaches vision problems hearing changes, CV/ PULM; No chest pain shortness of breath cough, syncope,edema  change in exercise tolerance. GI /GU: No adominal pain, vomiting, change in bowel habits. No blood in the stool. No significant GU symptoms. SKIN/HEME: ,no acute skin rashes suspicious lesions or bleeding. No lymphadenopathy, nodules, masses.  NEURO/ PSYCH:  No neurologic signs such as weakness numbness. No depression anxiety. IMM/ Allergy: No unusual infections.  Allergy .   REST of 12 system review negative except as per HPI   Past Medical History  Diagnosis Date  . Allergy   . Depression   . Headache(784.0)   . Mood disorder   . Hyperlipidemia     Inital 400's  . Osteoarthritis   . Primiparous   . Fatty liver     on [place] Jan 2011  . Abnormal LFTs   . Snoring     ENT [facility] Jan 2011 nasal obstruction and cryptic tonsils    Family History  Problem Relation Age of Onset  . Arthritis Mother   . COPD Mother   . Dementia Mother   .  Endometrial cancer Mother   . Skin cancer Mother   . Osteoporosis Mother   . Other Mother     dementia  . Heart attack Father   . Hypertension Father     cabd Mi 48 died CHF  . Heart failure Father   . Heart disease Father   . Sleep apnea Sister     improved with weight loss  . Arthritis Sister   . Coronary artery disease Sister   . Vasculitis Sister     History   Social History  . Marital Status: Married    Spouse Name: N/A    Number of Children: N/A  . Years of Education: N/A   Social History Main Topics  . Smoking status: Never Smoker   . Smokeless tobacco: None  . Alcohol Use: Yes     Comment: socially  . Drug Use: No  . Sexually Active:    Other Topics Concern  . None   Social History Narrative   Married   HH of 2   Pet Kittens   Works with Architectural technologist    employed [employer]  Teaches  Full time    6 Designer, jewellery some older  Outpatient Encounter Prescriptions as of 02/05/2013  Medication Sig Dispense Refill  . atorvastatin (LIPITOR) 80 MG tablet TAKE 1 TABLET BY MOUTH EVERY DAY  30 tablet  11  . buPROPion (WELLBUTRIN XL) 150 MG 24 hr tablet TAKE 1 TABLET (150 MG TOTAL) BY MOUTH EVERY MORNING.  30 tablet  11  . escitalopram (LEXAPRO) 10 MG tablet TAKE 1 TABLET BY MOUTH DAILY.  30 tablet  11  . FROVA 2.5 MG tablet TAKE 1 TABLET BY MOUTH AS NEEDED FOR MIGRAINE, THEN MAY REPEAT AFTER 2 HRS IF NEEDED, MAX 3/24 HRS  8 tablet  5  . mometasone (NASONEX) 50 MCG/ACT nasal spray USE 2 SPRAYS IN NOSTRIL DAILY  17 g  11  . [DISCONTINUED] atorvastatin (LIPITOR) 80 MG tablet Take 1 tablet (80 mg total) by mouth daily.  30 tablet  11  . [DISCONTINUED] atorvastatin (LIPITOR) 80 MG tablet TAKE 1 TABLET BY MOUTH EVERY DAY  30 tablet  2  . [DISCONTINUED] buPROPion (WELLBUTRIN XL) 150 MG 24 hr tablet TAKE 1 TABLET (150 MG TOTAL) BY MOUTH EVERY MORNING.  30 tablet  11  . [DISCONTINUED] escitalopram (LEXAPRO) 10 MG tablet TAKE  1 TABLET BY MOUTH DAILY.  30 tablet  11  . [DISCONTINUED] NASONEX 50 MCG/ACT nasal spray USE 2 SPRAYS IN NOSTRIL DAILY  17 g  4  . [DISCONTINUED] buPROPion (WELLBUTRIN XL) 150 MG 24 hr tablet Take 1 tablet (150 mg total) by mouth every morning.  30 tablet  11  . [DISCONTINUED] escitalopram (LEXAPRO) 10 MG tablet Take 1 tablet (10 mg total) by mouth daily.  30 tablet  11   No facility-administered encounter medications on file as of 02/05/2013.    EXAM:  BP 120/76  Pulse 72  Temp(Src) 98.1 F (36.7 C) (Oral)  Ht 5\' 4"  (1.626 m)  Wt 217 lb (98.431 kg)  BMI 37.23 kg/m2  SpO2 96%  Body mass index is 37.23 kg/(m^2).  Physical Exam: Vital signs reviewed WUJ:WJXB is a well-developed well-nourished alert cooperative   female who appears her stated age in no acute distress.  HEENT: normocephalic atraumatic , Eyes: PERRL EOM's full, conjunctiva clear, Nares: paten,t no deformity discharge or tenderness., Ears: no deformity EAC's clear TMs with normal landmarks. Mouth: clear OP, no lesions, edema.  Moist mucous membranes. Dentition in adequate repair. NECK: supple without masses, thyromegaly or bruits. CHEST/PULM:  Clear to auscultation and percussion breath sounds equal no wheeze , rales or rhonchi. No chest wall deformities or tenderness. Breast: normal by inspection . No dimpling, discharge, masses, tenderness or discharge . CV: PMI is nondisplaced, S1 S2 no gallops, murmurs, rubs. Peripheral pulses are full without delay.No JVD .  ABDOMEN: Bowel sounds normal nontender  No guard or rebound, no hepato splenomegal no CVA tenderness.  No hernia. Extremtities:  No clubbing cyanosis or edema, no acute joint swelling or redness no focal atrophy right elbow with mild tenderness lateral nl rom no effusion NEURO:  Oriented x3, cranial nerves 3-12 appear to be intact, no obvious focal weakness,gait within normal limits no abnormal reflexes or asymmetrical SKIN: No acute rashes normal turgor, color, no  bruising or petechiae. PSYCH: Oriented, good eye contact, no obvious depression anxiety, cognition and judgment appear normal. LN: no cervical axillary inguinal adenopathy  Lab Results  Component Value Date   WBC 6.4 01/24/2013   HGB 11.5* 01/24/2013   HCT 34.3* 01/24/2013   PLT 261.0 01/24/2013   GLUCOSE 86 01/24/2013   CHOL 215* 01/24/2013   TRIG 107.0  01/24/2013   HDL 58.50 01/24/2013   LDLDIRECT 142.9 01/24/2013   ALT 21 01/24/2013   AST 22 01/24/2013   NA 141 01/24/2013   K 4.2 01/24/2013   CL 109 01/24/2013   CREATININE 0.9 01/24/2013   BUN 10 01/24/2013   CO2 27 01/24/2013   TSH 2.11 01/24/2013    ASSESSMENT AND PLAN:  Discussed the following assessment and plan:  Visit for preventive health examination - declines mammo traumatic biopsy in past.  HYPERLIPIDEMIA  Medication management - depression in remission disc decreasing med plan if wishes   Mild anemia - utd on colon no other risk  check stool cards and recheck lab in 1-2 months  if ok yearly.   Epicondylitis, lateral (tennis elbow), right - resolving  can try exercises  ADJUSTMENT DISORDER WITH ANXIOUS MOOD - better can wean med   declining mammo  Had a barbaric  biopsy done for calcification in the past and feels traumatized by this  . Willing to take risk of no screening Counseled regarding healthy nutrition, exercise, sleep, injury prevention, calcium vit d and healthy weight .  Patient Care Team: Madelin Headings, MD as PCP - General Drema Halon, MD Patient Instructions  Can try weaning wellbutrin  And see  How you do.  If doing ok then can do 5 mg lexapro for at least a month .     Continue   lipitor  At this time.    Exercise walking and fresh fruits and vegetables.    Do stool cards  And  Check blood count in  1-2 months  . Decide if needs fu depending on results        Lateral Epicondylitis (Tennis Elbow) with Rehab Lateral epicondylitis involves inflammation and pain around the outer portion  of the elbow. The pain is caused by inflammation of the tendons in the forearm that bring back (extend) the wrist. Lateral epicondylittis is also called tennis elbow, because it is very common in tennis players. However, it may occur in any individual who extends the wrist repetitively. If lateral epicondylitis is left untreated, it may become a chronic problem. SYMPTOMS   Pain, tenderness, and inflammation on the outer (lateral) side of the elbow.  Pain or weakness with gripping activities.  Pain that increases with wrist twisting motions (playing tennis, using a screwdriver, opening a door or a jar).  Pain with lifting objects, including a coffee cup. CAUSES  Lateral epicondylitis is caused by inflammation of the tendons that extend the wrist. Causes of injury may include:  Repetitive stress and strain on the muscles and tendons that extend the wrist.  Sudden change in activity level or intensity.  Incorrect grip in racquet sports.  Incorrect grip size of racquet (often too large).  Incorrect hitting position or technique (usually backhand, leading with the elbow).  Using a racket that is too heavy. RISK INCREASES WITH:  Sports or occupations that require repetitive and/or strenuous forearm and wrist movements (tennis, squash, racquetball, carpentry).  Poor wrist and forearm strength and flexibility.  Failure to warm up properly before activity.  Resuming activity before healing, rehabilitation, and conditioning are complete. PREVENTION   Warm up and stretch properly before activity.  Maintain physical fitness:  Strength, flexibility, and endurance.  Cardiovascular fitness.  Wear and use properly fitted equipment.  Learn and use proper technique and have a coach correct improper technique.  Wear a tennis elbow (counterforce) brace. PROGNOSIS  The course of this condition depends on the  degree of the injury. If treated properly, acute cases (symptoms lasting less  than 4 weeks) are often resolved in 2 to 6 weeks. Chronic (longer lasting cases) often resolve in 3 to 6 months, but may require physical therapy. RELATED COMPLICATIONS   Frequently recurring symptoms, resulting in a chronic problem. Properly treating the problem the first time decreases frequency of recurrence.  Chronic inflammation, scarring tendon degeneration, and partial tendon tear, requiring surgery.  Delayed healing or resolution of symptoms. TREATMENT  Treatment first involves the use of ice and medicine, to reduce pain and inflammation. Strengthening and stretching exercises may help reduce discomfort, if performed regularly. These exercises may be performed at home, if the condition is an acute injury. Chronic cases may require a referral to a physical therapist for evaluation and treatment. Your caregiver may advise a corticosteroid injection, to help reduce inflammation. Rarely, surgery is needed. MEDICATION  If pain medicine is needed, nonsteroidal anti-inflammatory medicines (aspirin and ibuprofen), or other minor pain relievers (acetaminophen), are often advised.  Do not take pain medicine for 7 days before surgery.  Prescription pain relievers may be given, if your caregiver thinks they are needed. Use only as directed and only as much as you need.  Corticosteroid injections may be recommended. These injections should be reserved only for the most severe cases, because they can only be given a certain number of times. HEAT AND COLD  Cold treatment (icing) should be applied for 10 to 15 minutes every 2 to 3 hours for inflammation and pain, and immediately after activity that aggravates your symptoms. Use ice packs or an ice massage.  Heat treatment may be used before performing stretching and strengthening activities prescribed by your caregiver, physical therapist, or athletic trainer. Use a heat pack or a warm water soak. SEEK MEDICAL CARE IF: Symptoms get worse or do not  improve in 2 weeks, despite treatment. EXERCISES  RANGE OF MOTION (ROM) AND STRETCHING EXERCISES - Epicondylitis, Lateral (Tennis Elbow) These exercises may help you when beginning to rehabilitate your injury. Your symptoms may go away with or without further involvement from your physician, physical therapist or athletic trainer. While completing these exercises, remember:   Restoring tissue flexibility helps normal motion to return to the joints. This allows healthier, less painful movement and activity.  An effective stretch should be held for at least 30 seconds.  A stretch should never be painful. You should only feel a gentle lengthening or release in the stretched tissue. RANGE OF MOTION  Wrist Flexion, Active-Assisted  Extend your right / left elbow with your fingers pointing down.*  Gently pull the back of your hand towards you, until you feel a gentle stretch on the top of your forearm.  Hold this position for __________ seconds. Repeat __________ times. Complete this exercise __________ times per day.  *If directed by your physician, physical therapist or athletic trainer, complete this stretch with your elbow bent, rather than extended. RANGE OF MOTION  Wrist Extension, Active-Assisted  Extend your right / left elbow and turn your palm upwards.*  Gently pull your palm and fingertips back, so your wrist extends and your fingers point more toward the ground.  You should feel a gentle stretch on the inside of your forearm.  Hold this position for __________ seconds. Repeat __________ times. Complete this exercise __________ times per day. *If directed by your physician, physical therapist or athletic trainer, complete this stretch with your elbow bent, rather than extended. STRETCH - Wrist Flexion  Place the  back of your right / left hand on a tabletop, leaving your elbow slightly bent. Your fingers should point away from your body.  Gently press the back of your hand down  onto the table by straightening your elbow. You should feel a stretch on the top of your forearm.  Hold this position for __________ seconds. Repeat __________ times. Complete this stretch __________ times per day.  STRETCH  Wrist Extension   Place your right / left fingertips on a tabletop, leaving your elbow slightly bent. Your fingers should point backwards.  Gently press your fingers and palm down onto the table by straightening your elbow. You should feel a stretch on the inside of your forearm.  Hold this position for __________ seconds. Repeat __________ times. Complete this stretch __________ times per day.  STRENGTHENING EXERCISES - Epicondylitis, Lateral (Tennis Elbow) These exercises may help you when beginning to rehabilitate your injury. They may resolve your symptoms with or without further involvement from your physician, physical therapist or athletic trainer. While completing these exercises, remember:   Muscles can gain both the endurance and the strength needed for everyday activities through controlled exercises.  Complete these exercises as instructed by your physician, physical therapist or athletic trainer. Increase the resistance and repetitions only as guided.  You may experience muscle soreness or fatigue, but the pain or discomfort you are trying to eliminate should never worsen during these exercises. If this pain does get worse, stop and make sure you are following the directions exactly. If the pain is still present after adjustments, discontinue the exercise until you can discuss the trouble with your caregiver. STRENGTH Wrist Flexors  Sit with your right / left forearm palm-up and fully supported on a table or countertop. Your elbow should be resting below the height of your shoulder. Allow your wrist to extend over the edge of the surface.  Loosely holding a __________ weight, or a piece of rubber exercise band or tubing, slowly curl your hand up toward your  forearm.  Hold this position for __________ seconds. Slowly lower the wrist back to the starting position in a controlled manner. Repeat __________ times. Complete this exercise __________ times per day.  STRENGTH  Wrist Extensors  Sit with your right / left forearm palm-down and fully supported on a table or countertop. Your elbow should be resting below the height of your shoulder. Allow your wrist to extend over the edge of the surface.  Loosely holding a __________ weight, or a piece of rubber exercise band or tubing, slowly curl your hand up toward your forearm.  Hold this position for __________ seconds. Slowly lower the wrist back to the starting position in a controlled manner. Repeat __________ times. Complete this exercise __________ times per day.  STRENGTH - Ulnar Deviators  Stand with a ____________________ weight in your right / left hand, or sit while holding a rubber exercise band or tubing, with your healthy arm supported on a table or countertop.  Move your wrist, so that your pinkie travels toward your forearm and your thumb moves away from your forearm.  Hold this position for __________ seconds and then slowly lower the wrist back to the starting position. Repeat __________ times. Complete this exercise __________ times per day STRENGTH - Radial Deviators  Stand with a ____________________ weight in your right / left hand, or sit while holding a rubber exercise band or tubing, with your injured arm supported on a table or countertop.  Raise your hand upward in front  of you or pull up on the rubber tubing.  Hold this position for __________ seconds and then slowly lower the wrist back to the starting position. Repeat __________ times. Complete this exercise __________ times per day. STRENGTH  Forearm Supinators   Sit with your right / left forearm supported on a table, keeping your elbow below shoulder height. Rest your hand over the edge, palm down.  Gently grip a  hammer or a soup ladle.  Without moving your elbow, slowly turn your palm and hand upward to a "thumbs-up" position.  Hold this position for __________ seconds. Slowly return to the starting position. Repeat __________ times. Complete this exercise __________ times per day.  STRENGTH  Forearm Pronators   Sit with your right / left forearm supported on a table, keeping your elbow below shoulder height. Rest your hand over the edge, palm up.  Gently grip a hammer or a soup ladle.  Without moving your elbow, slowly turn your palm and hand upward to a "thumbs-up" position.  Hold this position for __________ seconds. Slowly return to the starting position. Repeat __________ times. Complete this exercise __________ times per day.  STRENGTH - Grip  Grasp a tennis ball, a dense sponge, or a large, rolled sock in your hand.  Squeeze as hard as you can, without increasing any pain.  Hold this position for __________ seconds. Release your grip slowly. Repeat __________ times. Complete this exercise __________ times per day.  STRENGTH - Elbow Extensors, Isometric  Stand or sit upright, on a firm surface. Place your right / left arm so that your palm faces your stomach, and it is at the height of your waist.  Place your opposite hand on the underside of your forearm. Gently push up as your right / left arm resists. Push as hard as you can with both arms, without causing any pain or movement at your right / left elbow. Hold this stationary position for __________ seconds. Gradually release the tension in both arms. Allow your muscles to relax completely before repeating. Document Released: 08/14/2005 Document Revised: 11/06/2011 Document Reviewed: 11/26/2008 [facility] Patient Information 2014 [place], [place].     Neta Mends. Jakia Kennebrew M.D. Health Maintenance  Topic Date Due  . Influenza Vaccine  04/28/2013  . Pap Smear  01/31/2015  . Colonoscopy  07/27/2019  . Tetanus/tdap  01/24/2021   Health  Maintenance Review  Declines mammography.

## 2013-02-06 DIAGNOSIS — D649 Anemia, unspecified: Secondary | ICD-10-CM | POA: Insufficient documentation

## 2013-02-06 DIAGNOSIS — M7710 Lateral epicondylitis, unspecified elbow: Secondary | ICD-10-CM | POA: Insufficient documentation

## 2013-02-06 DIAGNOSIS — Z0000 Encounter for general adult medical examination without abnormal findings: Secondary | ICD-10-CM | POA: Insufficient documentation

## 2013-02-06 DIAGNOSIS — Z79899 Other long term (current) drug therapy: Secondary | ICD-10-CM | POA: Insufficient documentation

## 2013-02-07 ENCOUNTER — Other Ambulatory Visit: Payer: Self-pay | Admitting: Internal Medicine

## 2013-02-26 ENCOUNTER — Other Ambulatory Visit (INDEPENDENT_AMBULATORY_CARE_PROVIDER_SITE_OTHER): Payer: BC Managed Care – PPO

## 2013-02-26 DIAGNOSIS — D649 Anemia, unspecified: Secondary | ICD-10-CM

## 2013-02-26 LAB — POC HEMOCCULT BLD/STL (HOME/3-CARD/SCREEN)
Card #3 Date: NEGATIVE
Card #3 Fecal Occult Blood, POC: NEGATIVE

## 2013-03-04 ENCOUNTER — Encounter: Payer: Self-pay | Admitting: Family Medicine

## 2013-04-08 ENCOUNTER — Other Ambulatory Visit (INDEPENDENT_AMBULATORY_CARE_PROVIDER_SITE_OTHER): Payer: BC Managed Care – PPO

## 2013-04-08 DIAGNOSIS — D649 Anemia, unspecified: Secondary | ICD-10-CM

## 2013-04-08 LAB — CBC WITH DIFFERENTIAL/PLATELET
Basophils Absolute: 0 10*3/uL (ref 0.0–0.1)
Eosinophils Relative: 2.5 % (ref 0.0–5.0)
HCT: 36.3 % (ref 36.0–46.0)
Hemoglobin: 11.8 g/dL — ABNORMAL LOW (ref 12.0–15.0)
Lymphocytes Relative: 31.3 % (ref 12.0–46.0)
Lymphs Abs: 2.5 10*3/uL (ref 0.7–4.0)
Monocytes Relative: 7.3 % (ref 3.0–12.0)
Platelets: 288 10*3/uL (ref 150.0–400.0)
RDW: 15 % — ABNORMAL HIGH (ref 11.5–14.6)
WBC: 8.1 10*3/uL (ref 4.5–10.5)

## 2013-04-08 LAB — IBC PANEL
Saturation Ratios: 14.6 % — ABNORMAL LOW (ref 20.0–50.0)
Transferrin: 279.4 mg/dL (ref 212.0–360.0)

## 2013-04-08 LAB — FERRITIN: Ferritin: 19.8 ng/mL (ref 10.0–291.0)

## 2013-04-13 ENCOUNTER — Other Ambulatory Visit: Payer: Self-pay | Admitting: Internal Medicine

## 2013-12-06 ENCOUNTER — Other Ambulatory Visit: Payer: Self-pay | Admitting: Internal Medicine

## 2014-02-11 ENCOUNTER — Other Ambulatory Visit: Payer: Self-pay | Admitting: Internal Medicine

## 2014-02-11 ENCOUNTER — Other Ambulatory Visit: Payer: Self-pay | Admitting: Family Medicine

## 2014-02-11 DIAGNOSIS — Z0000 Encounter for general adult medical examination without abnormal findings: Secondary | ICD-10-CM

## 2014-02-11 NOTE — Telephone Encounter (Signed)
Called and spoke to the pt.  She has made her future CPE and lab appt with Dr. Regis Bill.  Medication sent to the pharmacy for 1 months.

## 2014-02-12 ENCOUNTER — Telehealth: Payer: Self-pay | Admitting: Internal Medicine

## 2014-02-12 NOTE — Telephone Encounter (Signed)
[id number] - [place], [place] - [address] sent a PA request for FROVA 2.5 and it has a list of PREFERRED medications are as follows:  SUMATRIPTAN SUCCINATE, REPLAX, OR RIZATRIPTAN.  Please advise if you can switch the pt to one of the preferred choices or if you want me to submit the PA.  I am sending the PA request back to you for review.

## 2014-02-15 NOTE — Telephone Encounter (Signed)
Pt has a cpx this week   atell her what is going on and see if she can wait to talk about this at her [facility]

## 2014-02-16 NOTE — Telephone Encounter (Signed)
Printed for upcoming appt on 02/23/14

## 2014-02-19 ENCOUNTER — Other Ambulatory Visit (INDEPENDENT_AMBULATORY_CARE_PROVIDER_SITE_OTHER): Payer: BC Managed Care – PPO

## 2014-02-19 DIAGNOSIS — Z0000 Encounter for general adult medical examination without abnormal findings: Secondary | ICD-10-CM

## 2014-02-19 LAB — CBC WITH DIFFERENTIAL/PLATELET
Basophils Absolute: 0 10*3/uL (ref 0.0–0.1)
Basophils Relative: 0.6 % (ref 0.0–3.0)
Eosinophils Absolute: 0.2 10*3/uL (ref 0.0–0.7)
Eosinophils Relative: 3.2 % (ref 0.0–5.0)
HCT: 37 % (ref 36.0–46.0)
HEMOGLOBIN: 12.3 g/dL (ref 12.0–15.0)
LYMPHS PCT: 30.5 % (ref 12.0–46.0)
Lymphs Abs: 2 10*3/uL (ref 0.7–4.0)
MCHC: 33.1 g/dL (ref 30.0–36.0)
MCV: 83.5 fl (ref 78.0–100.0)
MONOS PCT: 9.3 % (ref 3.0–12.0)
Monocytes Absolute: 0.6 10*3/uL (ref 0.1–1.0)
Neutro Abs: 3.7 10*3/uL (ref 1.4–7.7)
Neutrophils Relative %: 56.4 % (ref 43.0–77.0)
PLATELETS: 310 10*3/uL (ref 150.0–400.0)
RBC: 4.43 Mil/uL (ref 3.87–5.11)
RDW: 14.9 % (ref 11.5–15.5)
WBC: 6.5 10*3/uL (ref 4.0–10.5)

## 2014-02-19 LAB — LIPID PANEL
Cholesterol: 228 mg/dL — ABNORMAL HIGH (ref 0–200)
HDL: 64.4 mg/dL (ref >39.00)
LDL CALC: 145 mg/dL — AB (ref 0–99)
NONHDL: 163.6
Total CHOL/HDL Ratio: 4
Triglycerides: 92 mg/dL (ref 0.0–149.0)
VLDL: 18.4 mg/dL (ref 0.0–40.0)

## 2014-02-19 LAB — HEPATIC FUNCTION PANEL
ALK PHOS: 110 U/L (ref 39–117)
ALT: 25 U/L (ref 0–35)
AST: 26 U/L (ref 0–37)
Albumin: 4.1 g/dL (ref 3.5–5.2)
Bilirubin, Direct: 0.1 mg/dL (ref 0.0–0.3)
TOTAL PROTEIN: 7.4 g/dL (ref 6.0–8.3)
Total Bilirubin: 0.6 mg/dL (ref 0.2–1.2)

## 2014-02-19 LAB — BASIC METABOLIC PANEL
BUN: 17 mg/dL (ref 6–23)
CHLORIDE: 107 meq/L (ref 96–112)
CO2: 27 meq/L (ref 19–32)
Calcium: 9.3 mg/dL (ref 8.4–10.5)
Creatinine, Ser: 0.8 mg/dL (ref 0.4–1.2)
GFR: 76.52 mL/min (ref >60.00)
Glucose, Bld: 90 mg/dL (ref 70–99)
Potassium: 4 mEq/L (ref 3.5–5.1)
SODIUM: 141 meq/L (ref 135–145)

## 2014-02-19 LAB — LDL CHOLESTEROL, DIRECT: Direct LDL: 157.4 mg/dL

## 2014-02-19 LAB — TSH: TSH: 2.49 u[IU]/mL (ref 0.35–4.50)

## 2014-02-23 ENCOUNTER — Ambulatory Visit (INDEPENDENT_AMBULATORY_CARE_PROVIDER_SITE_OTHER): Payer: BC Managed Care – PPO | Admitting: Internal Medicine

## 2014-02-23 ENCOUNTER — Encounter: Payer: Self-pay | Admitting: Internal Medicine

## 2014-02-23 VITALS — BP 124/76 | Temp 98.4°F | Ht 64.25 in | Wt 224.0 lb

## 2014-02-23 DIAGNOSIS — E669 Obesity, unspecified: Secondary | ICD-10-CM

## 2014-02-23 DIAGNOSIS — E785 Hyperlipidemia, unspecified: Secondary | ICD-10-CM

## 2014-02-23 DIAGNOSIS — Z0000 Encounter for general adult medical examination without abnormal findings: Secondary | ICD-10-CM

## 2014-02-23 DIAGNOSIS — M25519 Pain in unspecified shoulder: Secondary | ICD-10-CM

## 2014-02-23 DIAGNOSIS — Z79899 Other long term (current) drug therapy: Secondary | ICD-10-CM

## 2014-02-23 DIAGNOSIS — M25512 Pain in left shoulder: Secondary | ICD-10-CM | POA: Insufficient documentation

## 2014-02-23 DIAGNOSIS — G43009 Migraine without aura, not intractable, without status migrainosus: Secondary | ICD-10-CM | POA: Insufficient documentation

## 2014-02-23 MED ORDER — MOMETASONE FUROATE 50 MCG/ACT NA SUSP: NASAL | Status: DC

## 2014-02-23 MED ORDER — ELETRIPTAN HYDROBROMIDE 40 MG PO TABS: 40.0000 mg | ORAL_TABLET | ORAL | Status: DC | PRN

## 2014-02-23 MED ORDER — ESCITALOPRAM OXALATE 10 MG PO TABS: ORAL_TABLET | ORAL | Status: DC

## 2014-02-23 MED ORDER — ATORVASTATIN CALCIUM 80 MG PO TABS: ORAL_TABLET | ORAL | Status: DC

## 2014-02-23 NOTE — Progress Notes (Signed)
Chief Complaint  Patient presents with  . Annual Exam    HPI: Patient comes in today for Preventive Health Care visit  Concern about weight knows needs to do better for health tends to like food and  Convenience  Eats out almost every day.  Mood: Off wellbutrin  Want to stay on lexapro.mood is good  LIPID no se of me needs refill HAs : About 2 x per month doing better  Had failed imitrex in past and frova  was best for menstrual migraine insurance  Prefers Sumatriptan relpax or zomig  .snores no OSA Allergies:  Needs refill nasonex.   Health Maintenance  Topic Date Due  . Influenza Vaccine  03/28/2014  . Pap Smear  01/31/2015  . Colonoscopy  07/27/2019  . Tetanus/tdap  01/24/2021   Health Maintenance Review LIFESTYLE:  Exercise:  Not much  Tobacco/ETS: no Alcohol: per day  Rare  Sugar beverages:likes sweets cookies etc Sleep: enough Drug use: no Bone density:  Colonoscopy:  Due 2020  ROS:  GEN/ HEENT: No fever, significant weight changes sweats headaches vision problems hearing changes, CV/ PULM; No chest pain shortness of breath cough, syncope,edema  change in exercise tolerance. GI /GU: No adominal pain, vomiting, change in bowel habits. No blood in the stool. No significant GU symptoms. Left shopulder strained  Month ago. Gets am hand stiffness  No swelling  SKIN/HEME: ,no acute skin rashes suspicious lesions or bleeding. No lymphadenopathy, nodules, masses.  NEURO/ PSYCH:  No neurologic signs such as weakness numbness. No depression anxiety. IMM/ Allergy: No unusual infections.  Allergy .   REST of 12 system review negative except as per HPI   Past Medical History  Diagnosis Date  . Allergy   . Depression   . Headache(784.0)   . Mood disorder   . Hyperlipidemia     Inital 400's  . Osteoarthritis   . Primiparous   . Fatty liver     on [place] Jan 2011  . Abnormal LFTs   . Snoring     ENT [facility] Jan 2011 nasal obstruction and cryptic tonsils    Family  History  Problem Relation Age of Onset  . Arthritis Mother   . COPD Mother   . Dementia Mother   . Endometrial cancer Mother   . Skin cancer Mother   . Osteoporosis Mother   . Other Mother     dementia  . Heart attack Father   . Hypertension Father     cabd Mi 25 died CHF  . Heart failure Father   . Heart disease Father   . Sleep apnea Sister     improved with weight loss  . Arthritis Sister   . Coronary artery disease Sister   . Vasculitis Sister     History   Social History  . Marital Status: Married    Spouse Name: N/A    Number of Children: N/A  . Years of Education: N/A   Social History Main Topics  . Smoking status: Never Smoker   . Smokeless tobacco: None  . Alcohol Use: Yes     Comment: socially  . Drug Use: No  . Sexual Activity:    Other Topics Concern  . 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 atorvastatin (LIPITOR) 80 MG tablet TAKE 1 TABLET BY MOUTH EVERY DAY  . escitalopram (LEXAPRO) 10 MG tablet TAKE 1 TABLET BY MOUTH DAILY.  Marland Kitchen FROVA 2.5 MG tablet TAKE 1 TABLET FOR MIGRAINE,MAY REPEAT AFTER 2 HOURS IF NEEDED,MAX 3/24 HOURS  . IRON PO Take by mouth. OTC 45 mg  . mometasone (NASONEX) 50 MCG/ACT nasal spray USE 2 SPRAYS IN NOSTRIL DAILY  . [DISCONTINUED] atorvastatin (LIPITOR) 80 MG tablet TAKE 1 TABLET BY MOUTH EVERY DAY  . [DISCONTINUED] escitalopram (LEXAPRO) 10 MG tablet TAKE 1 TABLET BY MOUTH DAILY.  . [DISCONTINUED] NASONEX 50 MCG/ACT nasal spray USE 2 SPRAYS IN NOSTRIL DAILY  . eletriptan (RELPAX) 40 MG tablet Take 1 tablet (40 mg total) by mouth as needed for migraine or headache. One tablet by mouth at onset of headache. May repeat in 2 hours if headache persists or recurs.  .  [DISCONTINUED] buPROPion (WELLBUTRIN XL) 150 MG 24 hr tablet TAKE 1 TABLET (150 MG TOTAL) BY MOUTH EVERY MORNING.    EXAM:  BP 124/76  Temp(Src) 98.4 F (36.9 C) (Oral)  Ht 5' 4.25" (1.632 m)  Wt 224 lb (101.606 kg)  BMI 38.15 kg/m2  Body mass index is 38.15 kg/(m^2). Wt Readings from Last 3 Encounters:  02/23/14 224 lb (101.606 kg)  02/05/13 217 lb (98.431 kg)  01/31/12 222 lb (100.699 kg)    Physical Exam: Vital signs reviewed [id number] is a well-developed well-nourished alert cooperative    who appearsr stated age in no acute distress.  HEENT: normocephalic atraumatic , Eyes: PERRL EOM's full, conjunctiva clear, Nares: paten,t no deformity discharge or tenderness., Ears: no deformity EAC's clear TMs with normal landmarks. Mouth: clear OP, no lesions, edema.  Moist mucous membranes. Dentition in adequate repair. NECK: supple without masses, thyromegaly or bruits. CHEST/PULM:  Clear to auscultation and percussion breath sounds equal no wheeze , rales or rhonchi. No chest wall deformities or tenderness. Breast: normal by inspection . No dimpling, discharge, masses, tenderness or discharge . CV: PMI is nondisplaced, S1 S2 no gallops, murmurs, rubs. Peripheral pulses are full without delay.No JVD .  ABDOMEN: Bowel sounds normal nontender  No guard or rebound, no hepato splenomegal no CVA tenderness.   Extremtities:  No clubbing cyanosis or edema, no acute joint swelling or redness no focal atrophy left area dec rom from pain not severe  NEURO:  Oriented x3, cranial nerves 3-12 appear to be intact, no obvious focal weakness,gait within normal limits no abnormal reflexes or asymmetrical SKIN: No acute rashes normal turgor, color, no bruising or petechiae. PSYCH: Oriented, good eye contact, no obvious depression anxiety, cognition and judgment appear normal. LN: no cervical axillary inguinal adenopathy  Lab Results  Component Value Date   WBC 6.5 02/19/2014   HGB 12.3 02/19/2014   HCT  37.0 02/19/2014   PLT 310.0 02/19/2014   GLUCOSE 90 02/19/2014   CHOL 228* 02/19/2014   TRIG 92.0 02/19/2014   HDL 64.40 02/19/2014   LDLDIRECT 157.4 02/19/2014   LDLCALC 145* 02/19/2014   ALT 25 02/19/2014   AST 26 02/19/2014   NA 141 02/19/2014   K 4.0 02/19/2014   CL 107 02/19/2014   CREATININE 0.8 02/19/2014   BUN 17 02/19/2014   CO2 27 02/19/2014   TSH 2.49 02/19/2014    ASSESSMENT AND PLAN:  Discussed the following assessment  and plan:  Visit for preventive health examination  Medication management  HYPERLIPIDEMIA  OBESITY - worsening  disc strategies   Migraine without aura and without status migrainosus, not intractable - mestrual componenet insurance fomulary issues   Shoulder pain, left - after minor strain months ago poss bursitis tendintis  advise sm or ortho check ?  if pt wouldl help other  - Plan: AMB referral to orthopedics Bursitis lefrt shoulder   Cut down on maproxyn .  Can address weight lexapro etc  Patient Care Team: Burnis Medin, MD as PCP - General Rozetta Nunnery, MD Patient Instructions  I  agree weight control  .  Very important . Track accurately intake food and beverages  So you can make a plan Define reasons in way of change . Making own food usually helps decrease  Calories and less  Unhealthy choices . Try different migraine med for now  Because periods are now gone and fomulary  Issues .  Contact us if not working . Check into shingles vaccine ( Zostavax) reimbursement or cost to you  Usually get this at age 83 or above . 150 minutes of exercise weeks  ,  Lose weight  To healthy levels. Avoid trans fats and processed foods;  Increase fresh fruits and veges to 5 servings per day. And avoid sweet beverages  Including tea and juice.  3500 calories is the energy content of a pound of body weight .Must have a 3500 cal deficit to lose one pound . Thus decrease 500 calorie equivalent per day in food or drink intake / or exercise  for 7 days to lose  one pound.   rov in 4-6 months to check all of above   Mariann Laster K. Pattijo Juste M.D.

## 2014-02-23 NOTE — Patient Instructions (Addendum)
I  agree weight control  .  Very important . Track accurately intake food and beverages  So you can make a plan Define reasons in way of change . Making own food usually helps decrease  Calories and less  Unhealthy choices . Try different migraine med for now  Because periods are now gone and fomulary  Issues .  Contact us if not working . Check into shingles vaccine ( Zostavax) reimbursement or cost to you  Usually get this at age 56 or above . 150 minutes of exercise weeks  ,  Lose weight  To healthy levels. Avoid trans fats and processed foods;  Increase fresh fruits and veges to 5 servings per day. And avoid sweet beverages  Including tea and juice.  3500 calories is the energy content of a pound of body weight .Must have a 3500 cal deficit to lose one pound . Thus decrease 500 calorie equivalent per day in food or drink intake / or exercise  for 7 days to lose one pound.   rov in 4-6 months to check all of above

## 2014-06-05 ENCOUNTER — Other Ambulatory Visit: Payer: Self-pay | Admitting: Internal Medicine

## 2014-08-10 ENCOUNTER — Encounter: Payer: Self-pay | Admitting: Internal Medicine

## 2014-08-10 ENCOUNTER — Ambulatory Visit (INDEPENDENT_AMBULATORY_CARE_PROVIDER_SITE_OTHER): Payer: BC Managed Care – PPO | Admitting: Internal Medicine

## 2014-08-10 VITALS — BP 124/76 | Temp 97.8°F | Ht 64.25 in | Wt 216.0 lb

## 2014-08-10 DIAGNOSIS — E785 Hyperlipidemia, unspecified: Secondary | ICD-10-CM

## 2014-08-10 DIAGNOSIS — M25512 Pain in left shoulder: Secondary | ICD-10-CM

## 2014-08-10 DIAGNOSIS — Z79899 Other long term (current) drug therapy: Secondary | ICD-10-CM

## 2014-08-10 DIAGNOSIS — G43009 Migraine without aura, not intractable, without status migrainosus: Secondary | ICD-10-CM

## 2014-08-10 MED ORDER — ELETRIPTAN HYDROBROMIDE 40 MG PO TABS: ORAL_TABLET | ORAL | Status: DC

## 2014-08-10 MED ORDER — MELOXICAM 15 MG PO TABS: 15.0000 mg | ORAL_TABLET | Freq: Every day | ORAL | Status: DC

## 2014-08-10 NOTE — Patient Instructions (Addendum)
Will refill the  Relpax. Get back with  Ortho / Sports medicine ...about the left shoulder  Pain and  decrease  Range of motion poss help with PT . Try meloxicam for antiinflammatory for 2-3 weeks  In the interim .  May be easier on stomach . Continue weight  Loss   Preventive visit  In 6 months

## 2014-08-10 NOTE — Progress Notes (Signed)
Pre visit review using our clinic review tool, if applicable. No additional management support is needed unless otherwise documented below in the visit note.  Chief Complaint  Patient presents with  . Follow-up    HAs medication  shoulder    HPI: Audrey Jenkins 58 y.o.  Chronic disease/medication  management  HAs  Switching to relpax per inurance coverage  ..having to take  More  Only had  6 on can need more .   Usually   Takes second dose to be complete relief  one.   Ave  Per months  8-=9 in  A month .  Sleep  :Getting   Most some fatigue.  Working   Full time  Sinus attack now getting better . Had steroid shot left shoulder in June .   Helped some but still sore and dec rom  Never fu cause of care taking needs  Asks about  rx  otc nsaids cause Hb.  lexapro helping mood seems to help and stable LIPID  Nose of med taking lipitor daily ROS: See pertinent positives and negatives per HPI.  Past Medical History  Diagnosis Date  . Allergy   . Depression   . Headache(784.0)   . Mood disorder   . Hyperlipidemia     Inital 400's  . Osteoarthritis   . Primiparous   . Fatty liver     on [place] Jan 2011  . Abnormal LFTs   . Snoring     ENT [facility] Jan 2011 nasal obstruction and cryptic tonsils    Family History  Problem Relation Age of Onset  . Arthritis Mother   . COPD Mother   . Dementia Mother   . Endometrial cancer Mother   . Skin cancer Mother   . Osteoporosis Mother   . Other Mother     dementia  . Heart attack Father   . Hypertension Father     cabd Mi 49 died CHF  . Heart failure Father   . Heart disease Father   . Sleep apnea Sister     improved with weight loss  . Arthritis Sister   . Coronary artery disease Sister   . Vasculitis Sister     History   Social History  . Marital Status: Married    Spouse Name: N/A    Number of Children: N/A  . Years of Education: N/A   Social History Main Topics  . Smoking status: Never Smoker   . Smokeless tobacco: None    . Alcohol Use: Yes     Comment: socially  . Drug Use: No  . Sexual Activity: None   Other Topics Concern  . 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 atorvastatin (LIPITOR) 80 MG tablet TAKE 1 TABLET BY MOUTH EVERY DAY  . eletriptan (RELPAX) 40 MG tablet TAKE 1 TABLET BY MOUTH AS NEEDED FOR MIGRAINE OR HEADACHE. MAY REPEAT IN 2 HOURS IF NEEDED  . escitalopram (LEXAPRO) 10 MG tablet TAKE 1 TABLET BY MOUTH DAILY.  . IRON PO Take by mouth. OTC 45 mg  . mometasone (NASONEX) 50 MCG/ACT  nasal spray USE 2 SPRAYS IN NOSTRIL DAILY  . [DISCONTINUED] RELPAX 40 MG tablet TAKE 1 TABLET BY MOUTH AS NEEDED FOR MIGRAINE OR HEADACHE. MAY REPEAT IN 2 HOURS IF NEEDED  . meloxicam (MOBIC) 15 MG tablet Take 1 tablet (15 mg total) by mouth daily.  . [DISCONTINUED] FROVA 2.5 MG tablet TAKE 1 TABLET FOR MIGRAINE,MAY REPEAT AFTER 2 HOURS IF NEEDED,MAX 3/24 HOURS    EXAM:  BP 124/76 mmHg  Temp(Src) 97.8 F (36.6 C) (Oral)  Ht 5' 4.25" (1.632 m)  Wt 216 lb (97.977 kg)  BMI 36.79 kg/m2  Body mass index is 36.79 kg/(m^2).  GENERAL: vitals reviewed and listed above, alert, oriented, appears well hydrated and in no acute distress HEENT: atraumatic, conjunctiva  clear, no obvious abnormalities on inspection of external nose and ears OP : no lesion edema or exudate  NECK: no obvious masses on inspection palpation  LUNGS: clear to auscultation bilaterally, no wheezes, rales or rhonchi, good air movement CV: HRRR, no clubbing cyanosis or  peripheral edema nl cap refill  MS: moves all extremities without noticeable focal  Abnormality left shoulder dec ROM elevation and rotation    PSYCH: pleasant and cooperative, no obvious depression or  anxiety Lab Results  Component Value Date   WBC 6.5 02/19/2014   HGB 12.3 02/19/2014   HCT 37.0 02/19/2014   PLT 310.0 02/19/2014   GLUCOSE 90 02/19/2014   CHOL 228* 02/19/2014   TRIG 92.0 02/19/2014   HDL 64.40 02/19/2014   LDLDIRECT 157.4 02/19/2014   LDLCALC 145* 02/19/2014   ALT 25 02/19/2014   AST 26 02/19/2014   NA 141 02/19/2014   K 4.0 02/19/2014   CL 107 02/19/2014   CREATININE 0.8 02/19/2014   BUN 17 02/19/2014   CO2 27 02/19/2014   TSH 2.49 02/19/2014   Wt Readings from Last 3 Encounters:  08/10/14 216 lb (97.977 kg)  02/23/14 224 lb (101.606 kg)  02/05/13 217 lb (98.431 kg)    ASSESSMENT AND PLAN:  Discussed the following assessment and plan:  Migraine without aura and without status migrainosus, not intractable - refill relpax for 9 per mos at this time  track  Medication management  Shoulder pain, left - w dec rom needs reeval prob pt other   whe wil lcall for appt trial mobic in interim  Hyperlipidemia  -Patient advised to return or notify health care team  if symptoms worsen ,persist or new concerns arise.  Patient Instructions  Will refill the  Relpax. Get back with  Ortho / Sports medicine ...about the left shoulder  Pain and  decrease  Range of motion poss help with PT . Try meloxicam for antiinflammatory for 2-3 weeks  In the interim .  May be easier on stomach . Continue weight  Loss   Preventive visit  In 6 months   Sean Macwilliams K. Amaru Burroughs M.D.

## 2014-11-01 ENCOUNTER — Other Ambulatory Visit: Payer: Self-pay | Admitting: Internal Medicine

## 2014-12-16 ENCOUNTER — Telehealth: Payer: Self-pay | Admitting: Internal Medicine

## 2014-12-16 NOTE — Telephone Encounter (Signed)
Either  Advise OV to discuss if wishes to do this

## 2014-12-16 NOTE — Telephone Encounter (Signed)
Pt has successfully weaned off lexapro as discussed. However, pt is having severe hot flashes and night sweats.   Pt would like to know if this could be related.  Pt not necessarily want to go back on lexapro,.. But is there anything that could help. Pt has CPE 03/02/15

## 2014-12-16 NOTE — Telephone Encounter (Signed)
Probably related   Either menopasueal that the med was  Hel;ping   or Weaning too  Fast   Estrogen replacement works the best. But wouldn't use it long term Other options s not as good but there are many meds   That could be tried   Look up on  [email or web address] for at  Treatment for menopausal treatments   Or the NAMS  ([place] [employer] menopausal society ) in the interim.

## 2014-12-16 NOTE — Telephone Encounter (Signed)
Pt would like to know if she should do the hormone replacement therapy  Or return to generic lexapro.  Pt has tried the non medicine options

## 2014-12-16 NOTE — Telephone Encounter (Signed)
Spoke to the pt.  Gave her information for [email or web address] and NAMS.  She will look at those sites and will call back if she decided to try estrogen replacement therapy.

## 2014-12-17 NOTE — Telephone Encounter (Signed)
Patient is aware 

## 2014-12-28 ENCOUNTER — Other Ambulatory Visit: Payer: Self-pay | Admitting: Family Medicine

## 2014-12-28 ENCOUNTER — Telehealth: Payer: Self-pay | Admitting: Internal Medicine

## 2014-12-28 NOTE — Telephone Encounter (Signed)
Noted in the chart.

## 2014-12-28 NOTE — Telephone Encounter (Signed)
PA was received for Relpax, 9 per 30.  I was advised by [facility] that patient's plan allows 8 per 30 days and the next re-fill date is 01/04/15.

## 2015-02-05 ENCOUNTER — Encounter: Payer: Self-pay | Admitting: Internal Medicine

## 2015-02-18 ENCOUNTER — Other Ambulatory Visit (INDEPENDENT_AMBULATORY_CARE_PROVIDER_SITE_OTHER): Payer: BC Managed Care – PPO

## 2015-02-18 DIAGNOSIS — Z0000 Encounter for general adult medical examination without abnormal findings: Secondary | ICD-10-CM | POA: Diagnosis not present

## 2015-02-18 LAB — CBC WITH DIFFERENTIAL/PLATELET
Basophils Absolute: 0 10*3/uL (ref 0.0–0.1)
Basophils Relative: 0.6 % (ref 0.0–3.0)
EOS PCT: 2.9 % (ref 0.0–5.0)
Eosinophils Absolute: 0.2 10*3/uL (ref 0.0–0.7)
HEMATOCRIT: 37.7 % (ref 36.0–46.0)
HEMOGLOBIN: 12.4 g/dL (ref 12.0–15.0)
LYMPHS ABS: 2 10*3/uL (ref 0.7–4.0)
Lymphocytes Relative: 32.6 % (ref 12.0–46.0)
MCHC: 32.9 g/dL (ref 30.0–36.0)
MCV: 85.1 fl (ref 78.0–100.0)
MONOS PCT: 8 % (ref 3.0–12.0)
Monocytes Absolute: 0.5 10*3/uL (ref 0.1–1.0)
Neutro Abs: 3.5 10*3/uL (ref 1.4–7.7)
Neutrophils Relative %: 55.9 % (ref 43.0–77.0)
PLATELETS: 285 10*3/uL (ref 150.0–400.0)
RBC: 4.43 Mil/uL (ref 3.87–5.11)
RDW: 14.6 % (ref 11.5–15.5)
WBC: 6.3 10*3/uL (ref 4.0–10.5)

## 2015-02-18 LAB — HEPATIC FUNCTION PANEL
ALK PHOS: 124 U/L — AB (ref 39–117)
ALT: 36 U/L — ABNORMAL HIGH (ref 0–35)
AST: 31 U/L (ref 0–37)
Albumin: 4.1 g/dL (ref 3.5–5.2)
BILIRUBIN DIRECT: 0.1 mg/dL (ref 0.0–0.3)
BILIRUBIN TOTAL: 0.7 mg/dL (ref 0.2–1.2)
TOTAL PROTEIN: 7.2 g/dL (ref 6.0–8.3)

## 2015-02-18 LAB — LIPID PANEL
CHOLESTEROL: 243 mg/dL — AB (ref 0–200)
HDL: 52 mg/dL (ref >39.00)
LDL CALC: 166 mg/dL — AB (ref 0–99)
NonHDL: 191
Total CHOL/HDL Ratio: 5
Triglycerides: 123 mg/dL (ref 0.0–149.0)
VLDL: 24.6 mg/dL (ref 0.0–40.0)

## 2015-02-18 LAB — BASIC METABOLIC PANEL
BUN: 16 mg/dL (ref 6–23)
CHLORIDE: 107 meq/L (ref 96–112)
CO2: 29 mEq/L (ref 19–32)
Calcium: 9.5 mg/dL (ref 8.4–10.5)
Creatinine, Ser: 0.73 mg/dL (ref 0.40–1.20)
GFR: 87.2 mL/min (ref >60.00)
GLUCOSE: 92 mg/dL (ref 70–99)
POTASSIUM: 4.6 meq/L (ref 3.5–5.1)
SODIUM: 141 meq/L (ref 135–145)

## 2015-02-18 LAB — TSH: TSH: 1.73 u[IU]/mL (ref 0.35–4.50)

## 2015-02-23 ENCOUNTER — Other Ambulatory Visit: Payer: BC Managed Care – PPO | Admitting: Internal Medicine

## 2015-02-23 ENCOUNTER — Other Ambulatory Visit: Payer: BC Managed Care – PPO

## 2015-03-02 ENCOUNTER — Ambulatory Visit (INDEPENDENT_AMBULATORY_CARE_PROVIDER_SITE_OTHER): Payer: BC Managed Care – PPO | Admitting: Internal Medicine

## 2015-03-02 ENCOUNTER — Encounter: Payer: Self-pay | Admitting: Internal Medicine

## 2015-03-02 ENCOUNTER — Other Ambulatory Visit (HOSPITAL_COMMUNITY)
Admission: RE | Admit: 2015-03-02 | Discharge: 2015-03-02 | Disposition: A | Discharge status: Home / Self Care | Payer: BC Managed Care – PPO | Source: Ambulatory Visit | Attending: Internal Medicine | Admitting: Internal Medicine

## 2015-03-02 VITALS — BP 112/70 | Temp 97.9°F | Ht 63.5 in | Wt 210.5 lb

## 2015-03-02 DIAGNOSIS — Z1151 Encounter for screening for human papillomavirus (HPV): Secondary | ICD-10-CM | POA: Insufficient documentation

## 2015-03-02 DIAGNOSIS — Z0000 Encounter for general adult medical examination without abnormal findings: Secondary | ICD-10-CM

## 2015-03-02 DIAGNOSIS — H9192 Unspecified hearing loss, left ear: Secondary | ICD-10-CM

## 2015-03-02 DIAGNOSIS — Z79899 Other long term (current) drug therapy: Secondary | ICD-10-CM

## 2015-03-02 DIAGNOSIS — R7989 Other specified abnormal findings of blood chemistry: Secondary | ICD-10-CM | POA: Diagnosis not present

## 2015-03-02 DIAGNOSIS — R945 Abnormal results of liver function studies: Secondary | ICD-10-CM

## 2015-03-02 DIAGNOSIS — Z6836 Body mass index (BMI) 36.0-36.9, adult: Secondary | ICD-10-CM

## 2015-03-02 DIAGNOSIS — Z01419 Encounter for gynecological examination (general) (routine) without abnormal findings: Secondary | ICD-10-CM | POA: Insufficient documentation

## 2015-03-02 DIAGNOSIS — E785 Hyperlipidemia, unspecified: Secondary | ICD-10-CM | POA: Diagnosis not present

## 2015-03-02 NOTE — Patient Instructions (Addendum)
Continue lifestyle intervention healthy eating and exercise . This will eventually help you cholesterol and liver .  Weight loss can help snoring . contast [place] if having concern about stopping breathing at night and excess fatigue headaches and high blood pressure . Will notify you when pap results are available. If ok and hpv neg can go 5 years  For next PAP.   Plan on ENT evaluation for the  Hearing loss in the left ear with ringing.  asymmetric symptoms . Can try the mobic for1-  2 weeks at a time instead of ongoing to avoid  Potential side effects .    Plan labs in   4-6 months lfts  Lipids  And then ROV.      Why follow it? Research shows. . Those who follow the Mediterranean diet have a reduced risk of heart disease  . The diet is associated with a reduced incidence of Parkinson's and Alzheimer's diseases . People following the diet may have longer life expectancies and lower rates of chronic diseases  . The Dietary Guidelines for Americans recommends the Mediterranean diet as an eating plan to promote health and prevent disease  What Is the Mediterranean Diet?  . Healthy eating plan based on typical foods and recipes of Mediterranean-style cooking . The diet is primarily a plant based diet; these foods should make up a majority of meals   Starches - Plant based foods should make up a majority of meals - They are an important sources of vitamins, minerals, energy, antioxidants, and fiber - Choose whole grains, foods high in fiber and minimally processed items  - Typical grain sources include wheat, oats, barley, corn, brown rice, bulgar, farro, millet, polenta, couscous  - Various types of beans include chickpeas, lentils, fava beans, black beans, white beans   Fruits  Veggies - Large quantities of antioxidant rich fruits & veggies; 6 or more servings  - Vegetables can be eaten raw or lightly drizzled with oil and cooked  - Vegetables common to the traditional Mediterranean Diet  include: artichokes, arugula, beets, broccoli, brussel sprouts, cabbage, carrots, celery, collard greens, cucumbers, eggplant, kale, leeks, lemons, lettuce, mushrooms, okra, onions, peas, peppers, potatoes, pumpkin, radishes, rutabaga, shallots, spinach, sweet potatoes, turnips, zucchini - Fruits common to the Mediterranean Diet include: apples, apricots, avocados, cherries, clementines, dates, figs, grapefruits, grapes, melons, nectarines, oranges, peaches, pears, pomegranates, strawberries, tangerines  Fats - Replace butter and margarine with healthy oils, such as olive oil, canola oil, and tahini  - Limit nuts to no more than a handful a day  - Nuts include walnuts, almonds, pecans, pistachios, pine nuts  - Limit or avoid candied, honey roasted or heavily salted nuts - Olives are central to the [employer] - can be eaten whole or used in a variety of dishes   Meats Protein - Limiting red meat: no more than a few times a month - When eating red meat: choose lean cuts and keep the portion to the size of deck of cards - Eggs: approx. 0 to 4 times a week  - Fish and lean poultry: at least 2 a week  - Healthy protein sources include, chicken, [place], lean beef, lamb - Increase intake of seafood such as tuna, salmon, trout, mackerel, shrimp, scallops - Avoid or limit high fat processed meats such as sausage and bacon  Dairy - Include moderate amounts of low fat dairy products  - Focus on healthy dairy such as fat free yogurt, skim milk, low or reduced fat  cheese - Limit dairy products higher in fat such as whole or 2% milk, cheese, ice cream  Alcohol - Moderate amounts of red wine is ok  - No more than 5 oz daily for women (all ages) and men older than age 30  - No more than 10 oz of wine daily for men younger than 58  Other - Limit sweets and other desserts  - Use herbs and spices instead of salt to flavor foods  - Herbs and spices common to the traditional Mediterranean Diet include:  basil, bay leaves, chives, cloves, cumin, fennel, garlic, lavender, marjoram, mint, oregano, parsley, pepper, rosemary, sage, savory, sumac, tarragon, thyme   It's not just a diet, it's a lifestyle:  . The Mediterranean diet includes lifestyle factors typical of those in the region  . Foods, drinks and meals are best eaten with others and savored . Daily physical activity is important for overall good health . This could be strenuous exercise like running and aerobics . This could also be more leisurely activities such as walking, housework, yard-work, or taking the stairs . Moderation is the key; a balanced and healthy diet accommodates most foods and drinks . Consider portion sizes and frequency of consumption of certain foods   Meal Ideas & Options:  . Breakfast:  o Whole wheat toast or whole wheat English muffins with peanut butter & hard boiled egg o Steel cut oats topped with apples & cinnamon and skim milk  o Fresh fruit: banana, strawberries, melon, berries, peaches  o Smoothies: strawberries, bananas, greek yogurt, peanut butter o Low fat greek yogurt with blueberries and granola  o Egg white omelet with spinach and mushrooms o Breakfast couscous: whole wheat couscous, apricots, skim milk, cranberries  . Sandwiches:  o Hummus and grilled vegetables (peppers, zucchini, squash) on whole wheat bread   o Grilled chicken on whole wheat pita with lettuce, tomatoes, cucumbers or tzatziki  o Tuna salad on whole wheat bread: tuna salad made with greek yogurt, olives, red peppers, capers, green onions o Garlic rosemary lamb pita: lamb sauted with garlic, rosemary, salt & pepper; add lettuce, cucumber, greek yogurt to pita - flavor with lemon juice and black pepper  . Seafood:  o Mediterranean grilled salmon, seasoned with garlic, basil, parsley, lemon juice and black pepper o Shrimp, lemon, and spinach whole-grain pasta salad made with low fat greek yogurt  o Seared scallops with lemon  orzo  o Seared tuna steaks seasoned salt, pepper, coriander topped with tomato mixture of olives, tomatoes, olive oil, minced garlic, parsley, green onions and cappers  . Meats:  o Herbed greek chicken salad with kalamata olives, cucumber, feta  o Red bell peppers stuffed with spinach, bulgur, lean ground beef (or lentils) & topped with feta   o Kebabs: skewers of chicken, tomatoes, onions, zucchini, squash  o [place] burgers: made with red onions, mint, dill, lemon juice, feta cheese topped with roasted red peppers . Vegetarian o Cucumber salad: cucumbers, artichoke hearts, celery, red onion, feta cheese, tossed in olive oil & lemon juice  o Hummus and whole grain pita points with a greek salad (lettuce, tomato, feta, olives, cucumbers, red onion) o Lentil soup with celery, carrots made with vegetable broth, garlic, salt and pepper  o Tabouli salad: parsley, bulgur, mint, scallions, cucumbers, tomato, radishes, lemon juice, olive oil, salt and pepper.

## 2015-03-02 NOTE — Progress Notes (Signed)
Pre visit review using our clinic review tool, if applicable. No additional management support is needed unless otherwise documented below in the visit note.  Chief Complaint  Patient presents with  . Annual Exam    due for pap    HPI: Patient  Audrey Jenkins  59 y.o. comes in today for [place] visit  And med management .    Joints : Mobic  :really helped   Arthritis pain  But  Then Now bp is up so stopped 6 weeks ago and now back .. Down.   HAs : Not a lot  Of vomiting has  Last April the 4th  Helped relpax and frova .   Had weaned lexapro over months .  Then  Had flushes sweating  Profuse  Better going back on meds . Want s to stay there  ringing and dec hearing left ear no trauma  Itchy at times.  No pain family history of some hearing loss at older ages but nothing specific   losing weight slowly on a 12 week program at work. Is becoming accountable by using phone.    Health Maintenance  Topic Date Due  . PAP SMEAR  01/31/2015  . HIV Screening  02/26/2016 (Originally 09/11/1970)  . INFLUENZA VACCINE  03/29/2015  . COLONOSCOPY  07/26/2020  . TETANUS/TDAP  01/24/2021   Health Maintenance Review LIFESTYLE:  Exercise:  Trying  Tobacco/ETS:no Alcohol: per day  Rare  Sugar beverages: no Sleep: good  Some snoring  Drug use: no  ROS:  GEN/ HEENT: No fever, significant weight changes sweats headaches vision problems hearing changes, CV/ PULM; No chest pain shortness of breath cough, syncope,edema  change in exercise tolerance. GI /GU: No adominal pain, vomiting, change in bowel habits. No blood in the stool. No significant GU symptoms. SKIN/HEME: ,no acute skin rashes suspicious lesions or bleeding. No lymphadenopathy, nodules, masses.  NEURO/ PSYCH:  No neurologic signs such as weakness numbness. No depression anxiety. IMM/ Allergy: No unusual infections.  Allergy .   REST of 12 system review negative except as per HPI   Past Medical History  Diagnosis  Date  . Allergy   . Depression   . Headache(784.0)   . Mood disorder   . Hyperlipidemia     Inital 400's  . Osteoarthritis   . Primiparous   . Fatty liver     on [place] Jan 2011  . Abnormal LFTs   . Snoring     ENT [facility] Jan 2011 nasal obstruction and cryptic tonsils    Past Surgical History  Procedure Laterality Date  . Breast surgery  1995    bx    Family History  Problem Relation Age of Onset  . Arthritis Mother   . COPD Mother   . Dementia Mother   . Endometrial cancer Mother   . Skin cancer Mother   . Osteoporosis Mother   . Other Mother     dementia  . Heart attack Father   . Hypertension Father     cabd Mi 85 died CHF  . Heart failure Father   . Heart disease Father   . Sleep apnea Sister     improved with weight loss  . Arthritis Sister   . Coronary artery disease Sister   . Vasculitis Sister     History   Social History  . Marital Status: Married    Spouse Name: N/A  . Number of Children: N/A  . Years of Education: N/A  Social History Main Topics  . Smoking status: Never Smoker   . Smokeless tobacco: Never Used  . Alcohol Use: 0.0 oz/week    0 Standard drinks or equivalent per week     Comment: socially  . Drug Use: No  . Sexual Activity: Not on file   Other Topics Concern  . 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 atorvastatin (LIPITOR) 80 MG tablet TAKE 1 TABLET BY MOUTH EVERY DAY 90 tablet 3  . eletriptan (RELPAX) 40 MG tablet TAKE 1 TABLET BY MOUTH AS NEEDED FOR MIGRAINE OR HEADACHE. MAY REPEAT IN 2 HOURS IF NEEDED 9 tablet 6  . escitalopram (LEXAPRO) 10 MG tablet TAKE 1 TABLET BY MOUTH DAILY. 90 tablet 3  . IRON PO Take by mouth. OTC 45 mg    . meloxicam (MOBIC)  15 MG tablet TAKE 1 TABLET (15 MG TOTAL) BY MOUTH DAILY. (Patient not taking: Reported on 03/02/2015) 30 tablet 2  . mometasone (NASONEX) 50 MCG/ACT nasal spray USE 2 SPRAYS IN NOSTRIL DAILY (Patient not taking: Reported on 03/02/2015) 17 g prn   No facility-administered medications prior to visit.     EXAM:  BP 112/70 mmHg  Temp(Src) 97.9 F (36.6 C) (Oral)  Ht 5' 3.5" (1.613 m)  Wt 210 lb 8 oz (95.482 kg)  BMI 36.70 kg/m2  Body mass index is 36.7 kg/(m^2).  Physical Exam: Vital signs reviewed YQM:VHQI is a well-developed well-nourished alert cooperative    who appearsr stated age in no acute distress.  HEENT: normocephalic atraumatic , Eyes: PERRL EOM's full, conjunctiva clear, Nares: paten,t no deformity discharge or tenderness., Ears: no deformity EAC's clear TMs with normal landmarks. Mouth: clear OP, no lesions, edema.  Moist mucous membranes. Dentition in adequate repair. NECK: supple without masses, thyromegaly or bruits. CHEST/PULM:  Clear to auscultation and percussion breath sounds equal no wheeze , rales or rhonchi. No chest wall deformities or tenderness.Breast: normal by inspection . No dimpling, discharge, masses, tenderness or discharge . CV: PMI is nondisplaced, S1 S2 no gallops, murmurs, rubs. Peripheral pulses are full without delay.No JVD .  ABDOMEN: Bowel sounds normal nontender  No guard or rebound, no hepato splenomegal no CVA tenderness.  No hernia. Extremtities:  No clubbing cyanosis or edema, no acute joint swelling or redness no focal atrophy mild what looks like OA changes  NEURO:  Oriented x3, cranial nerves 3-12 appear to be intact, no obvious focal weakness,gait within normal limits no abnormal reflexes or asymmetrical SKIN: No acute rashes normal turgor, color, no bruising or petechiae. PSYCH: Oriented, good eye contact, no obvious depression anxiety, cognition and judgment appear normal. LN: no cervical axillary inguinal adenopathy Pelvic: NL ext GU, labia  clear without lesions or rash . Vagina no lesions some dryness  .Cervix: clear  Small os pap UTERUS: Neg CMT Adnexa:  clear no masses . PAP done hpv co test  Rectal no mass smear heme neg   Lab Results  Component Value Date   WBC 6.3 02/18/2015   HGB 12.4 02/18/2015   HCT 37.7 02/18/2015   PLT 285.0 02/18/2015  GLUCOSE 92 02/18/2015   CHOL 243* 02/18/2015   TRIG 123.0 02/18/2015   HDL 52.00 02/18/2015   LDLDIRECT 157.4 02/19/2014   LDLCALC 166* 02/18/2015   ALT 36* 02/18/2015   AST 31 02/18/2015   NA 141 02/18/2015   K 4.6 02/18/2015   CL 107 02/18/2015   CREATININE 0.73 02/18/2015   BUN 16 02/18/2015   CO2 29 02/18/2015   TSH 1.73 02/18/2015   BP Readings from Last 3 Encounters:  03/02/15 112/70  08/10/14 124/76  02/23/14 124/76   Wt Readings from Last 3 Encounters:  03/02/15 210 lb 8 oz (95.482 kg)  08/10/14 216 lb (97.977 kg)  02/23/14 224 lb (101.606 kg)    ASSESSMENT AND PLAN:  Discussed the following assessment and plan:  Visit for preventive health examination - Plan: PAP [[place]]  Encounter for routine gynecological examination - Plan: PAP [[place]]  Hyperlipidemia  Medication management  Abnormal LFTs - [place] 2011 cw heapatic steatosis presumed  Hearing decreased, left - Plan: Ambulatory referral to ENT  BMI 36.0-36.9,adult  Patient Care Team: Burnis Medin, MD as PCP - General Rozetta Nunnery, MD Patient Instructions   Continue lifestyle intervention healthy eating and exercise . This will eventually help you cholesterol and liver .  Weight loss can help snoring . contast [place] if having concern about stopping breathing at night and excess fatigue headaches and high blood pressure . Will notify you when pap results are available. If ok and hpv neg can go 5 years  For next PAP.   Plan on ENT evaluation for the  Hearing loss in the left ear with ringing.  asymmetric symptoms . Can try the mobic for1-  2 weeks at a time instead of  ongoing to avoid  Potential side effects .    Plan labs in   4-6 months lfts  Lipids  And then ROV.      Why follow it? Research shows. . Those who follow the Mediterranean diet have a reduced risk of heart disease  . The diet is associated with a reduced incidence of Parkinson's and Alzheimer's diseases . People following the diet may have longer life expectancies and lower rates of chronic diseases  . The Dietary Guidelines for Americans recommends the Mediterranean diet as an eating plan to promote health and prevent disease  What Is the Mediterranean Diet?  . Healthy eating plan based on typical foods and recipes of Mediterranean-style cooking . The diet is primarily a plant based diet; these foods should make up a majority of meals   Starches - Plant based foods should make up a majority of meals - They are an important sources of vitamins, minerals, energy, antioxidants, and fiber - Choose whole grains, foods high in fiber and minimally processed items  - Typical grain sources include wheat, oats, barley, corn, brown rice, bulgar, farro, millet, polenta, couscous  - Various types of beans include chickpeas, lentils, fava beans, black beans, white beans   Fruits  Veggies - Large quantities of antioxidant rich fruits & veggies; 6 or more servings  - Vegetables can be eaten raw or lightly drizzled with oil and cooked  - Vegetables common to the traditional Mediterranean Diet include: artichokes, arugula, beets, broccoli, brussel sprouts, cabbage, carrots, celery, collard greens, cucumbers, eggplant, kale, leeks, lemons, lettuce, mushrooms, okra, onions, peas, peppers, potatoes, pumpkin, radishes, rutabaga, shallots, spinach, sweet potatoes, turnips, zucchini - Fruits common to the Mediterranean Diet include: apples, apricots, avocados, cherries, clementines, dates, figs, grapefruits,  grapes, melons, nectarines, oranges, peaches, pears, pomegranates, strawberries, tangerines  Fats - Replace  butter and margarine with healthy oils, such as olive oil, canola oil, and tahini  - Limit nuts to no more than a handful a day  - Nuts include walnuts, almonds, pecans, pistachios, pine nuts  - Limit or avoid candied, honey roasted or heavily salted nuts - Olives are central to the Mediterranean diet - can be eaten whole or used in a variety of dishes   Meats Protein - Limiting red meat: no more than a few times a month - When eating red meat: choose lean cuts and keep the portion to the size of deck of cards - Eggs: approx. 0 to 4 times a week  - Fish and lean poultry: at least 2 a week  - Healthy protein sources include, chicken, [place], lean beef, lamb - Increase intake of seafood such as tuna, salmon, trout, mackerel, shrimp, scallops - Avoid or limit high fat processed meats such as sausage and bacon  Dairy - Include moderate amounts of low fat dairy products  - Focus on healthy dairy such as fat free yogurt, skim milk, low or reduced fat cheese - Limit dairy products higher in fat such as whole or 2% milk, cheese, ice cream  Alcohol - Moderate amounts of red wine is ok  - No more than 5 oz daily for women (all ages) and men older than age 17  - No more than 10 oz of wine daily for men younger than 36  Other - Limit sweets and other desserts  - Use herbs and spices instead of salt to flavor foods  - Herbs and spices common to the traditional Mediterranean Diet include: basil, bay leaves, chives, cloves, cumin, fennel, garlic, lavender, marjoram, mint, oregano, parsley, pepper, rosemary, sage, savory, sumac, tarragon, thyme   It's not just a diet, it's a lifestyle:  . The Mediterranean diet includes lifestyle factors typical of those in the region  . Foods, drinks and meals are best eaten with others and savored . Daily physical activity is important for overall good health . This could be strenuous exercise like running and aerobics . This could also be more leisurely activities such  as walking, housework, yard-work, or taking the stairs . Moderation is the key; a balanced and healthy diet accommodates most foods and drinks . Consider portion sizes and frequency of consumption of certain foods   Meal Ideas & Options:  . Breakfast:  o Whole wheat toast or whole wheat English muffins with peanut butter & hard boiled egg o Steel cut oats topped with apples & cinnamon and skim milk  o Fresh fruit: banana, strawberries, melon, berries, peaches  o Smoothies: strawberries, bananas, greek yogurt, peanut butter o Low fat greek yogurt with blueberries and granola  o Egg white omelet with spinach and mushrooms o Breakfast couscous: whole wheat couscous, apricots, skim milk, cranberries  . Sandwiches:  o Hummus and grilled vegetables (peppers, zucchini, squash) on whole wheat bread   o Grilled chicken on whole wheat pita with lettuce, tomatoes, cucumbers or tzatziki  o Tuna salad on whole wheat bread: tuna salad made with greek yogurt, olives, red peppers, capers, green onions o Garlic rosemary lamb pita: lamb sauted with garlic, rosemary, salt & pepper; add lettuce, cucumber, greek yogurt to pita - flavor with lemon juice and black pepper  . Seafood:  o Mediterranean grilled salmon, seasoned with garlic, basil, parsley, lemon juice and black pepper o Shrimp, lemon, and  spinach whole-grain pasta salad made with low fat greek yogurt  o Seared scallops with lemon orzo  o Seared tuna steaks seasoned salt, pepper, coriander topped with tomato mixture of olives, tomatoes, olive oil, minced garlic, parsley, green onions and cappers  . Meats:  o Herbed greek chicken salad with kalamata olives, cucumber, feta  o Red bell peppers stuffed with spinach, bulgur, lean ground beef (or lentils) & topped with feta   o Kebabs: skewers of chicken, tomatoes, onions, zucchini, squash  o [place] burgers: made with red onions, mint, dill, lemon juice, feta cheese topped with roasted red  peppers . Vegetarian o Cucumber salad: cucumbers, artichoke hearts, celery, red onion, feta cheese, tossed in olive oil & lemon juice  o Hummus and whole grain pita points with a greek salad (lettuce, tomato, feta, olives, cucumbers, red onion) o Lentil soup with celery, carrots made with vegetable broth, garlic, salt and pepper  o Tabouli salad: parsley, bulgur, mint, scallions, cucumbers, tomato, radishes, lemon juice, olive oil, salt and pepper.         Standley Brooking. Panosh M.D.

## 2015-03-03 LAB — CYTOLOGY - PAP

## 2015-03-04 NOTE — Progress Notes (Signed)
Quick Note:  Tell patient PAP is normal. HPV high risk is negative ______ 

## 2015-03-26 ENCOUNTER — Other Ambulatory Visit: Payer: Self-pay | Admitting: Internal Medicine

## 2015-03-26 NOTE — Telephone Encounter (Signed)
Ok to refll as asked

## 2015-03-27 ENCOUNTER — Other Ambulatory Visit: Payer: Self-pay | Admitting: Internal Medicine

## 2015-03-29 ENCOUNTER — Other Ambulatory Visit: Payer: Self-pay | Admitting: Internal Medicine

## 2015-03-29 NOTE — Telephone Encounter (Signed)
Sent to the pharmacy by e-scribe. 

## 2015-03-30 NOTE — Telephone Encounter (Signed)
Sent to the pharmacy by e-scribe. 

## 2015-08-09 ENCOUNTER — Other Ambulatory Visit: Payer: Self-pay | Admitting: Family Medicine

## 2015-08-09 ENCOUNTER — Other Ambulatory Visit (INDEPENDENT_AMBULATORY_CARE_PROVIDER_SITE_OTHER): Payer: BC Managed Care – PPO

## 2015-08-09 DIAGNOSIS — E785 Hyperlipidemia, unspecified: Secondary | ICD-10-CM

## 2015-08-09 LAB — LIPID PANEL
CHOLESTEROL: 249 mg/dL — AB (ref 0–200)
HDL: 60.7 mg/dL (ref >39.00)
LDL CALC: 162 mg/dL — AB (ref 0–99)
NonHDL: 188.32
Total CHOL/HDL Ratio: 4
Triglycerides: 134 mg/dL (ref 0.0–149.0)
VLDL: 26.8 mg/dL (ref 0.0–40.0)

## 2015-08-30 ENCOUNTER — Other Ambulatory Visit: Payer: Self-pay | Admitting: Internal Medicine

## 2015-09-01 NOTE — Telephone Encounter (Signed)
Can refill meds  For 6 months  document if she is  Is she taking the atorvastatin?     Noted liver panel wasn't done as planned last lab   Lipids about the same

## 2015-09-01 NOTE — Telephone Encounter (Signed)
Sent to the pharmacy by e-scribe. 

## 2015-09-08 ENCOUNTER — Ambulatory Visit (INDEPENDENT_AMBULATORY_CARE_PROVIDER_SITE_OTHER): Payer: BC Managed Care – PPO | Admitting: Internal Medicine

## 2015-09-08 ENCOUNTER — Encounter: Payer: Self-pay | Admitting: Internal Medicine

## 2015-09-08 VITALS — BP 120/72 | Temp 97.9°F | Wt 213.4 lb

## 2015-09-08 DIAGNOSIS — R945 Abnormal results of liver function studies: Secondary | ICD-10-CM

## 2015-09-08 DIAGNOSIS — M79643 Pain in unspecified hand: Secondary | ICD-10-CM | POA: Diagnosis not present

## 2015-09-08 DIAGNOSIS — Z79899 Other long term (current) drug therapy: Secondary | ICD-10-CM

## 2015-09-08 DIAGNOSIS — R7989 Other specified abnormal findings of blood chemistry: Secondary | ICD-10-CM

## 2015-09-08 DIAGNOSIS — E785 Hyperlipidemia, unspecified: Secondary | ICD-10-CM | POA: Diagnosis not present

## 2015-09-08 DIAGNOSIS — G8929 Other chronic pain: Secondary | ICD-10-CM

## 2015-09-08 NOTE — Progress Notes (Signed)
Pre visit review using our clinic review tool, if applicable. No additional management support is needed unless otherwise documented below in the visit note.  Chief Complaint  Patient presents with  . Follow-up    lipid liver meds    HPI: Audrey Jenkins 60 y.o.  comes in for chronic disease/ medication management  Lipid  And liver .   meloxicam  Works well  But inc bp    2 weeks of the month . Taking     Med 2 weeks day per month  "is magic for helping the pain in her thumbs"    Goes off then  pain increases and very difficult meloxicam causes elevated bp.  Taking lipitor no problems    fam hx lipids in 400 as was hers 300- 400 range   Father mi in ? 43 - 29 ROS: See pertinent positives and negatives per HPI.  Past Medical History  Diagnosis Date  . Allergy   . Depression   . Headache(784.0)   . Mood disorder ([place])   . Hyperlipidemia     Inital 400's  . Osteoarthritis   . Primiparous   . Fatty liver     on [place] Jan 2011  . Abnormal LFTs   . Snoring     ENT [facility] Jan 2011 nasal obstruction and cryptic tonsils    Family History  Problem Relation Age of Onset  . Arthritis Mother   . COPD Mother   . Dementia Mother   . Endometrial cancer Mother   . Skin cancer Mother   . Osteoporosis Mother   . Other Mother     dementia  . Heart attack Father   . Hypertension Father     cabd Mi 38 died CHF  . Heart failure Father   . Heart disease Father   . Sleep apnea Sister     improved with weight loss  . Arthritis Sister   . Coronary artery disease Sister   . Vasculitis Sister     Social History   Social History  . Marital Status: Married    Spouse Name: N/A  . Number of Children: N/A  . Years of Education: N/A   Social History Main Topics  . Smoking status: Never Smoker   . Smokeless tobacco: Never Used  . Alcohol Use: 0.0 oz/week    0 Standard drinks or equivalent per week     Comment: socially  . Drug Use: No  . Sexual Activity: Not Asked   Other Topics  Concern  . 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 atorvastatin (LIPITOR) 80 MG tablet TAKE 1 TABLET EVERY DAY 90 tablet 1  . escitalopram (LEXAPRO) 10 MG tablet TAKE 1 TABLET EVERY DAY 90 tablet 1  . meloxicam (MOBIC) 15 MG tablet TAKE 1 TABLET BY MOUTH DAILY. 30 tablet 5  . RELPAX 40 MG tablet TAKE 1 TABLET BY MOUTH AS NEEDED FOR MIGRAINE OR HEADACHE. MAY REPEAT IN 2 HOURS IF NEEDED 9 tablet 5  . IRON PO Take by mouth. OTC 45 mg    . mometasone (NASONEX)  50 MCG/ACT nasal spray USE 2 SPRAYS IN NOSTRIL DAILY (Patient not taking: Reported on 03/02/2015) 17 g prn   No facility-administered medications prior to visit.     EXAM:  BP 120/72 mmHg  Temp(Src) 97.9 F (36.6 C) (Oral)  Wt 213 lb 6.4 oz (96.798 kg)  Body mass index is 37.2 kg/(m^2).  GENERAL: vitals reviewed and listed above, alert, oriented, appears well hydrated and in no acute distress HEENT: atraumatic, conjunctiva  clear, no obvious abnormalities on inspection of external nose and ears OP : no lesion edema or exudate  NECK: no obvious masses on inspection palpation  Abdomen:  Sof,t normal bowel sounds without hepatosplenomegaly, no guarding rebound or masses no CVA tendernessMS: moves all extremities without noticeable focal  abnormality PSYCH: pleasant and cooperative, no obvious depression or anxiety Lab Results  Component Value Date   WBC 6.3 02/18/2015   HGB 12.4 02/18/2015   HCT 37.7 02/18/2015   PLT 285.0 02/18/2015   GLUCOSE 92 02/18/2015   CHOL 249* 08/09/2015   TRIG 134.0 08/09/2015   HDL 60.70 08/09/2015   LDLDIRECT 157.4 02/19/2014   LDLCALC 162* 08/09/2015   ALT 36* 02/18/2015   AST 31 02/18/2015   NA 141 02/18/2015   K 4.6  02/18/2015   CL 107 02/18/2015   CREATININE 0.73 02/18/2015   BUN 16 02/18/2015   CO2 29 02/18/2015   TSH 1.73 02/18/2015    ASSESSMENT AND PLAN:  Discussed the following assessment and plan:  Hyperlipidemia  Abnormal LFTs lfts not done as planned  Lipids about the same  consider other additions such as zetia  To get better results  Follow lfts  bnex labs  At cpx  Try  7.5 melox on  Other days   To balance  Good effect from risk effect.  Monitor bp  Wt Readings from Last 3 Encounters:  09/08/15 213 lb 6.4 oz (96.798 kg)  03/02/15 210 lb 8 oz (95.482 kg)  08/10/14 216 lb (97.977 kg)   BP Readings from Last 3 Encounters:  09/08/15 120/72  03/02/15 112/70  08/10/14 124/76    -Patient advised to return or notify health care team  if symptoms worsen ,persist or new concerns arise.  Patient Instructions  Try 1/2 dose  meloxicam   When   Joint thumb pain is calmed down .  And see if  Bp is controlled at that time .   cpx  In  July 17    With full set of labs  At that time .Marland Kitchen     Standley Brooking. Charissa Knowles M.D.

## 2015-09-08 NOTE — Patient Instructions (Signed)
Try 1/2 dose  meloxicam   When   Joint thumb pain is calmed down .  And see if  Bp is controlled at that time .   cpx  In  July 17    With full set of labs  At that time .Marland Kitchen

## 2016-03-06 ENCOUNTER — Other Ambulatory Visit (INDEPENDENT_AMBULATORY_CARE_PROVIDER_SITE_OTHER): Payer: BC Managed Care – PPO

## 2016-03-06 DIAGNOSIS — Z0000 Encounter for general adult medical examination without abnormal findings: Secondary | ICD-10-CM | POA: Diagnosis not present

## 2016-03-06 LAB — BASIC METABOLIC PANEL
BUN: 16 mg/dL (ref 6–23)
CO2: 28 mEq/L (ref 19–32)
CREATININE: 0.69 mg/dL (ref 0.40–1.20)
Calcium: 9.3 mg/dL (ref 8.4–10.5)
Chloride: 107 mEq/L (ref 96–112)
GFR: 92.72 mL/min (ref >60.00)
Glucose, Bld: 94 mg/dL (ref 70–99)
Potassium: 3.8 mEq/L (ref 3.5–5.1)
Sodium: 141 mEq/L (ref 135–145)

## 2016-03-06 LAB — TSH: TSH: 2.52 u[IU]/mL (ref 0.35–4.50)

## 2016-03-06 LAB — LIPID PANEL
CHOL/HDL RATIO: 4
Cholesterol: 231 mg/dL — ABNORMAL HIGH (ref 0–200)
HDL: 55.4 mg/dL (ref >39.00)
LDL CALC: 157 mg/dL — AB (ref 0–99)
NonHDL: 175.8
TRIGLYCERIDES: 95 mg/dL (ref 0.0–149.0)
VLDL: 19 mg/dL (ref 0.0–40.0)

## 2016-03-06 LAB — CBC WITH DIFFERENTIAL/PLATELET
Basophils Absolute: 0 10*3/uL (ref 0.0–0.1)
Basophils Relative: 0.6 % (ref 0.0–3.0)
EOS ABS: 0.2 10*3/uL (ref 0.0–0.7)
Eosinophils Relative: 3.3 % (ref 0.0–5.0)
HCT: 36.9 % (ref 36.0–46.0)
HEMOGLOBIN: 12.3 g/dL (ref 12.0–15.0)
Lymphocytes Relative: 31.9 % (ref 12.0–46.0)
Lymphs Abs: 2.1 10*3/uL (ref 0.7–4.0)
MCHC: 33.2 g/dL (ref 30.0–36.0)
MCV: 84.1 fl (ref 78.0–100.0)
MONO ABS: 0.6 10*3/uL (ref 0.1–1.0)
Monocytes Relative: 8.3 % (ref 3.0–12.0)
Neutro Abs: 3.8 10*3/uL (ref 1.4–7.7)
Neutrophils Relative %: 55.9 % (ref 43.0–77.0)
Platelets: 263 10*3/uL (ref 150.0–400.0)
RBC: 4.38 Mil/uL (ref 3.87–5.11)
RDW: 14.2 % (ref 11.5–15.5)
WBC: 6.7 10*3/uL (ref 4.0–10.5)

## 2016-03-06 LAB — HEPATIC FUNCTION PANEL
ALT: 18 U/L (ref 0–35)
AST: 17 U/L (ref 0–37)
Albumin: 3.9 g/dL (ref 3.5–5.2)
Alkaline Phosphatase: 115 U/L (ref 39–117)
BILIRUBIN DIRECT: 0 mg/dL (ref 0.0–0.3)
BILIRUBIN TOTAL: 0.5 mg/dL (ref 0.2–1.2)
Total Protein: 6.9 g/dL (ref 6.0–8.3)

## 2016-03-10 ENCOUNTER — Other Ambulatory Visit: Payer: Self-pay | Admitting: Internal Medicine

## 2016-03-10 NOTE — Telephone Encounter (Signed)
Has upcomioin cpx ok to refill  X 3

## 2016-03-12 NOTE — Progress Notes (Signed)
Chief Complaint  Patient presents with  . Annual Exam    HPI: Patient  Audrey Jenkins  60 y.o. comes in today for Preventive Health Care visit  And  Fu CDm LIPIDS :taking meds  BP: ok high normal. ocass mobic trials    Headaches   ocass .   Use  Feels like a food ie hard to stay on s track to lose weight and healthy eating  Tried dc  Slowly  lexapro and go hot flusejs  So went back on  Clicks right forearm no pain  Itchy area left elbow  No rash   Health Maintenance  Topic Date Due  . Hepatitis C Screening  Dec 12, 1955  . HIV Screening  09/11/1972  . INFLUENZA VACCINE  03/28/2016  . PAP SMEAR  03/01/2018  . COLONOSCOPY  07/26/2020  . TETANUS/TDAP  01/24/2021   Health Maintenance Review LIFESTYLE:  Exercise:   Some walking  Tobacco/ETS:no Alcohol:  none Sugar beverages:   No.    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     ROS:  GEN/ HEENT: No fever, significant weight changes sweats headaches vision problems hearing changes, CV/ PULM; No chest pain shortness of breath cough, syncope,edema  change in exercise tolerance. GI /GU: No adominal pain, vomiting, change in bowel habits. No blood in the stool. No significant GU symptoms. SKIN/HEME: ,no acute skin rashes suspicious lesions or bleeding. No lymphadenopathy, nodules, masses.  NEURO/ PSYCH:  No neurologic signs such as weakness numbness. No depression anxiety. IMM/ Allergy: No unusual infections.  Allergy .   REST of 12 system review negative except as per HPI   Past Medical History  Diagnosis Date  . Allergy   . Depression   . Headache(784.0)   . Mood disorder ([place])   . Hyperlipidemia     Inital 400's  . Osteoarthritis   . Primiparous   . Fatty liver     on [place] Jan 2011  . Abnormal LFTs   . Snoring     ENT [facility] Jan 2011 nasal obstruction and cryptic tonsils    Past Surgical History  Procedure Laterality Date  . Breast surgery  1995    bx    Family History  Problem Relation Age of Onset  . Arthritis Mother   . COPD Mother   . Dementia Mother   . Endometrial cancer Mother   . Skin cancer Mother   . Osteoporosis Mother   . Other Mother     dementia  . Heart attack Father   . Hypertension Father     cabd Mi 84 died CHF  . Heart failure Father   . Heart disease Father   . Sleep apnea Sister     improved with weight loss  . Arthritis Sister   . Coronary artery disease Sister   . Vasculitis Sister     Social History   Social History  . Marital Status: Married    Spouse Name: N/A  . Number of Children: N/A  . Years of Education: N/A   Social History Main Topics  . Smoking status: Never Smoker   . Smokeless tobacco: Never Used  . Alcohol Use: 0.0 oz/week    0 Standard drinks or equivalent per week     Comment: socially  . Drug Use: No  . Sexual Activity: Not Asked   Other Topics Concern  .  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 atorvastatin (LIPITOR) 80 MG tablet TAKE 1 TABLET EVERY DAY 90 tablet 1  . escitalopram (LEXAPRO) 10 MG tablet TAKE 1 TABLET EVERY DAY 90 tablet 1  . meloxicam (MOBIC) 15 MG tablet TAKE 1 TABLET BY MOUTH DAILY. 30 tablet 3  . MULTIPLE VITAMIN PO Take 1 tablet by mouth daily.    . RELPAX 40 MG tablet TAKE 1 TABLET BY MOUTH AS NEEDED FOR MIGRAINE OR HEADACHE. MAY REPEAT IN 2 HOURS IF NEEDED 9 tablet 5   No facility-administered medications prior to visit.     EXAM:  BP 130/80 mmHg  Pulse 61  Ht 5' 3.5" (1.613 m)  Wt 220 lb 8 oz (100.018 kg)  BMI 38.44 kg/m2  SpO2 96%  Body mass index is 38.44 kg/(m^2).  Physical Exam: Vital signs reviewed STM:HDQQ is a well-developed well-nourished alert  cooperative    who appearsr stated age in no acute distress.  HEENT: normocephalic atraumatic , Eyes: PERRL EOM's full, conjunctiva clear, Nares: paten,t no deformity discharge or tenderness., Ears: no deformity EAC's clear TMs with normal landmarks. Mouth: clear OP, no lesions, edema.  Moist mucous membranes. Dentition in adequate repair. NECK: supple without masses, thyromegaly or bruits. CHEST/PULM:  Clear to auscultation and percussion breath sounds equal no wheeze , rales or rhonchi. No chest wall deformities or tenderness. CV: PMI is nondisplaced, S1 S2 no gallops, murmurs, rubs. Peripheral pulses are full without delay.No JVD . Breast: normal by inspection . No dimpling, discharge, masses, tenderness or discharge . ABDOMEN: Bowel sounds normal nontender  No guard or rebound, no hepato splenomegal no CVA tenderness.  No hernia. Extremtities:  No clubbing cyanosis or edema, no acute joint swelling or redness no focal atrophy NEURO:  Oriented x3, cranial nerves 3-12 appear to be intact, no obvious focal weakness,gait within normal limits no abnormal reflexes or asymmetrical SKIN: No acute rashes normal turgor, color, no bruising or petechiae. PSYCH: Oriented, good eye contact, no obvious depression anxiety, cognition and judgment appear normal. LN: no cervical axillary inguinal adenopathy  Lab Results  Component Value Date   WBC 6.7 03/06/2016   HGB 12.3 03/06/2016   HCT 36.9 03/06/2016   PLT 263.0 03/06/2016   GLUCOSE 94 03/06/2016   CHOL 231* 03/06/2016   TRIG 95.0 03/06/2016   HDL 55.40 03/06/2016   LDLDIRECT 157.4 02/19/2014   LDLCALC 157* 03/06/2016   ALT 18 03/06/2016   AST 17 03/06/2016   NA 141 03/06/2016   K 3.8 03/06/2016   CL 107 03/06/2016   CREATININE 0.69 03/06/2016   BUN 16 03/06/2016   CO2 28 03/06/2016   TSH 2.52 03/06/2016   Wt Readings from Last 3 Encounters:  03/13/16 220 lb 8 oz (100.018 kg)  09/08/15 213 lb 6.4 oz (96.798 kg)  03/02/15 210 lb 8 oz  (95.482 kg)    ASSESSMENT AND PLAN:  Discussed the following assessment and plan:  Visit for preventive health examination  Hyperlipidemia  Medication management  BMI 38.0-38.9,adult  nsaid use   Not using reg   Can try to  dec to 5 lexapro  Seems to be helping hot flush  preventino Patient Care Team: Burnis Medin, MD as PCP - General Rozetta Nunnery, MD Patient Instructions   Strategies   Try  No processed carbs for a month . And   any way we can help.   Refer  Or other .  Make eating  Eat to live instead of live to eat.  Some people can get help with counseling   regarding triggers   Eating .   Check up in 1 year or as needed    Health Maintenance, Female Adopting a healthy lifestyle and getting preventive care can go a long way to promote health and wellness. Talk with your health care provider about what schedule of regular examinations is right for you. This is a good chance for you to check in with your provider about disease prevention and staying healthy. In between checkups, there are plenty of things you can do on your own. Experts have done a lot of research about which lifestyle changes and preventive measures are most likely to keep you healthy. Ask your health care provider for more information. WEIGHT AND DIET  Eat a healthy diet  Be sure to include plenty of vegetables, fruits, low-fat dairy products, and lean protein.  Do not eat a lot of foods high in solid fats, added sugars, or salt.  Get regular exercise. This is one of the most important things you can do for your health.  Most adults should exercise for at least 150 minutes each week. The exercise should increase your heart rate and make you sweat (moderate-intensity exercise).  Most adults should also do strengthening exercises at least twice a week. This is in addition to the moderate-intensity exercise.  Maintain a healthy weight  Body mass index (BMI) is a measurement that can be used to  identify possible weight problems. It estimates body fat based on height and weight. Your health care provider can help determine your BMI and help you achieve or maintain a healthy weight.  For females 70 years of age and older:   A BMI below 18.5 is considered underweight.  A BMI of 18.5 to 24.9 is normal.  A BMI of 25 to 29.9 is considered overweight.  A BMI of 30 and above is considered obese.  Watch levels of cholesterol and blood lipids  You should start having your blood tested for lipids and cholesterol at 58 years of age, then have this test every 5 years.  You may need to have your cholesterol levels checked more often if:  Your lipid or cholesterol levels are high.  You are older than 58 years of age.  You are at high risk for heart disease.  CANCER SCREENING   Lung Cancer  Lung cancer screening is recommended for adults 14-70 years old who are at high risk for lung cancer because of a history of smoking.  A yearly low-dose CT scan of the lungs is recommended for people who:  Currently smoke.  Have quit within the past 15 years.  Have at least a 30-pack-year history of smoking. A pack year is smoking an average of one pack of cigarettes a day for 1 year.  Yearly screening should continue until it has been 15 years since you quit.  Yearly screening should stop if you develop a health problem that would prevent you from having lung cancer treatment.  Breast Cancer  Practice breast self-awareness. This means understanding how your breasts  normally appear and feel.  It also means doing regular breast self-exams. Let your health care provider know about any changes, no matter how small.  If you are in your 20s or 30s, you should have a clinical breast exam (CBE) by a health care provider every 1-3 years as part of a regular health exam.  If you are 75 or older, have a CBE every year. Also consider having a breast X-ray (mammogram) every year.  If you have a  family history of breast cancer, talk to your health care provider about genetic screening.  If you are at high risk for breast cancer, talk to your health care provider about having an MRI and a mammogram every year.  Breast cancer gene (BRCA) assessment is recommended for women who have family members with BRCA-related cancers. BRCA-related cancers include:  Breast.  Ovarian.  Tubal.  Peritoneal cancers.  Results of the assessment will determine the need for genetic counseling and BRCA1 and BRCA2 testing. Cervical Cancer Your health care provider may recommend that you be screened regularly for cancer of the pelvic organs (ovaries, uterus, and vagina). This screening involves a pelvic examination, including checking for microscopic changes to the surface of your cervix (Pap test). You may be encouraged to have this screening done every 3 years, beginning at age 39.  For women ages 60-65, health care providers may recommend pelvic exams and Pap testing every 3 years, or they may recommend the Pap and pelvic exam, combined with testing for human papilloma virus (HPV), every 5 years. Some types of HPV increase your risk of cervical cancer. Testing for HPV may also be done on women of any age with unclear Pap test results.  Other health care providers may not recommend any screening for nonpregnant women who are considered low risk for pelvic cancer and who do not have symptoms. Ask your health care provider if a screening pelvic exam is right for you.  If you have had past treatment for cervical cancer or a condition that could lead to cancer, you need Pap tests and screening for cancer for at least 20 years after your treatment. If Pap tests have been discontinued, your risk factors (such as having a new sexual partner) need to be reassessed to determine if screening should resume. Some women have medical problems that increase the chance of getting cervical cancer. In these cases, your health  care provider may recommend more frequent screening and Pap tests. Colorectal Cancer  This type of cancer can be detected and often prevented.  Routine colorectal cancer screening usually begins at 58 years of age and continues through 58 years of age.  Your health care provider may recommend screening at an earlier age if you have risk factors for colon cancer.  Your health care provider may also recommend using home test kits to check for hidden blood in the stool.  A small camera at the end of a tube can be used to examine your colon directly (sigmoidoscopy or colonoscopy). This is done to check for the earliest forms of colorectal cancer.  Routine screening usually begins at age 55.  Direct examination of the colon should be repeated every 5-10 years through 58 years of age. However, you may need to be screened more often if early forms of precancerous polyps or small growths are found. Skin Cancer  Check your skin from head to toe regularly.  Tell your health care provider about any new moles or changes in moles, especially if there  is a change in a mole's shape or color.  Also tell your health care provider if you have a mole that is larger than the size of a pencil eraser.  Always use sunscreen. Apply sunscreen liberally and repeatedly throughout the day.  Protect yourself by wearing long sleeves, pants, a wide-brimmed hat, and sunglasses whenever you are outside. HEART DISEASE, DIABETES, AND HIGH BLOOD PRESSURE   High blood pressure causes heart disease and increases the risk of stroke. High blood pressure is more likely to develop in:  People who have blood pressure in the high end of the normal range (130-139/85-89 mm Hg).  People who are overweight or obese.  People who are African American.  If you are 78-47 years of age, have your blood pressure checked every 3-5 years. If you are 23 years of age or older, have your blood pressure checked every year. You should have  your blood pressure measured twice--once when you are at a hospital or clinic, and once when you are not at a hospital or clinic. Record the average of the two measurements. To check your blood pressure when you are not at a hospital or clinic, you can use:  An automated blood pressure machine at a pharmacy.  A home blood pressure monitor.  If you are between 4 years and 63 years old, ask your health care provider if you should take aspirin to prevent strokes.  Have regular diabetes screenings. This involves taking a blood sample to check your fasting blood sugar level.  If you are at a normal weight and have a low risk for diabetes, have this test once every three years after 58 years of age.  If you are overweight and have a high risk for diabetes, consider being tested at a younger age or more often. PREVENTING INFECTION  Hepatitis B  If you have a higher risk for hepatitis B, you should be screened for this virus. You are considered at high risk for hepatitis B if:  You were born in a country where hepatitis B is common. Ask your health care provider which countries are considered high risk.  Your parents were born in a high-risk country, and you have not been immunized against hepatitis B (hepatitis B vaccine).  You have HIV or AIDS.  You use needles to inject street drugs.  You live with someone who has hepatitis B.  You have had sex with someone who has hepatitis B.  You get hemodialysis treatment.  You take certain medicines for conditions, including cancer, organ transplantation, and autoimmune conditions. Hepatitis C  Blood testing is recommended for:  Everyone born from [birth year] through 1963.  Anyone with known risk factors for hepatitis C. Sexually transmitted infections (STIs)  You should be screened for sexually transmitted infections (STIs) including gonorrhea and chlamydia if:  You are sexually active and are younger than 58 years of age.  You are older than  58 years of age and your health care provider tells you that you are at risk for this type of infection.  Your sexual activity has changed since you were last screened and you are at an increased risk for chlamydia or gonorrhea. Ask your health care provider if you are at risk.  If you do not have HIV, but are at risk, it may be recommended that you take a prescription medicine daily to prevent HIV infection. This is called pre-exposure prophylaxis (PrEP). You are considered at risk if:  You are sexually active and do not  regularly use condoms or know the HIV status of your partner(s).  You take drugs by injection.  You are sexually active with a partner who has HIV. Talk with your health care provider about whether you are at high risk of being infected with HIV. If you choose to begin PrEP, you should first be tested for HIV. You should then be tested every 3 months for as long as you are taking PrEP.  PREGNANCY   If you are premenopausal and you may become pregnant, ask your health care provider about preconception counseling.  If you may become pregnant, take 400 to 800 micrograms (mcg) of folic acid every day.  If you want to prevent pregnancy, talk to your health care provider about birth control (contraception). OSTEOPOROSIS AND MENOPAUSE   Osteoporosis is a disease in which the bones lose minerals and strength with aging. This can result in serious bone fractures. Your risk for osteoporosis can be identified using a bone density scan.  If you are 84 years of age or older, or if you are at risk for osteoporosis and fractures, ask your health care provider if you should be screened.  Ask your health care provider whether you should take a calcium or vitamin D supplement to lower your risk for osteoporosis.  Menopause may have certain physical symptoms and risks.  Hormone replacement therapy may reduce some of these symptoms and risks. Talk to your health care provider about  whether hormone replacement therapy is right for you.  HOME CARE INSTRUCTIONS   Schedule regular health, dental, and eye exams.  Stay current with your immunizations.   Do not use any tobacco products including cigarettes, chewing tobacco, or electronic cigarettes.  If you are pregnant, do not drink alcohol.  If you are breastfeeding, limit how much and how often you drink alcohol.  Limit alcohol intake to no more than 1 drink per day for nonpregnant women. One drink equals 12 ounces of beer, 5 ounces of wine, or 1 ounces of hard liquor.  Do not use street drugs.  Do not share needles.  Ask your health care provider for help if you need support or information about quitting drugs.  Tell your health care provider if you often feel depressed.  Tell your health care provider if you have ever been abused or do not feel safe at home.   This information is not intended to replace advice given to you by your health care provider. Make sure you discuss any questions you have with your health care provider.   Document Released: 02/27/2011 Document Revised: 09/04/2014 Document Reviewed: 07/16/2013 Elsevier Interactive Patient Education 2016 [place] K. Arjun Hard M.D.

## 2016-03-13 ENCOUNTER — Encounter: Payer: Self-pay | Admitting: Internal Medicine

## 2016-03-13 ENCOUNTER — Ambulatory Visit (INDEPENDENT_AMBULATORY_CARE_PROVIDER_SITE_OTHER): Payer: BC Managed Care – PPO | Admitting: Internal Medicine

## 2016-03-13 VITALS — BP 130/80 | HR 61 | Ht 63.5 in | Wt 220.5 lb

## 2016-03-13 DIAGNOSIS — Z6838 Body mass index (BMI) 38.0-38.9, adult: Secondary | ICD-10-CM | POA: Diagnosis not present

## 2016-03-13 DIAGNOSIS — Z79899 Other long term (current) drug therapy: Secondary | ICD-10-CM | POA: Diagnosis not present

## 2016-03-13 DIAGNOSIS — E785 Hyperlipidemia, unspecified: Secondary | ICD-10-CM | POA: Diagnosis not present

## 2016-03-13 DIAGNOSIS — Z0000 Encounter for general adult medical examination without abnormal findings: Secondary | ICD-10-CM

## 2016-03-13 NOTE — Progress Notes (Signed)
Pre visit review using our clinic review tool, if applicable. No additional management support is needed unless otherwise documented below in the visit note. 

## 2016-03-13 NOTE — Patient Instructions (Addendum)
Strategies   Try  No processed carbs for a month . And   any way we can help.   Refer  Or other .  Make eating  Eat to live instead of live to eat.  Some people can get help with counseling   regarding triggers   Eating .   Check up in 1 year or as needed    Health Maintenance, Female Adopting a healthy lifestyle and getting preventive care can go a long way to promote health and wellness. Talk with your health care provider about what schedule of regular examinations is right for you. This is a good chance for you to check in with your provider about disease prevention and staying healthy. In between checkups, there are plenty of things you can do on your own. Experts have done a lot of research about which lifestyle changes and preventive measures are most likely to keep you healthy. Ask your health care provider for more information. WEIGHT AND DIET  Eat a healthy diet  Be sure to include plenty of vegetables, fruits, low-fat dairy products, and lean protein.  Do not eat a lot of foods high in solid fats, added sugars, or salt.  Get regular exercise. This is one of the most important things you can do for your health.  Most adults should exercise for at least 150 minutes each week. The exercise should increase your heart rate and make you sweat (moderate-intensity exercise).  Most adults should also do strengthening exercises at least twice a week. This is in addition to the moderate-intensity exercise.  Maintain a healthy weight  Body mass index (BMI) is a measurement that can be used to identify possible weight problems. It estimates body fat based on height and weight. Your health care provider can help determine your BMI and help you achieve or maintain a healthy weight.  For females 29 years of age and older:   A BMI below 18.5 is considered underweight.  A BMI of 18.5 to 24.9 is normal.  A BMI of 25 to 29.9 is considered overweight.  A BMI of 30 and above is considered  obese.  Watch levels of cholesterol and blood lipids  You should start having your blood tested for lipids and cholesterol at 58 years of age, then have this test every 5 years.  You may need to have your cholesterol levels checked more often if:  Your lipid or cholesterol levels are high.  You are older than 58 years of age.  You are at high risk for heart disease.  CANCER SCREENING   Lung Cancer  Lung cancer screening is recommended for adults 58-65 years old who are at high risk for lung cancer because of a history of smoking.  A yearly low-dose CT scan of the lungs is recommended for people who:  Currently smoke.  Have quit within the past 15 years.  Have at least a 30-pack-year history of smoking. A pack year is smoking an average of one pack of cigarettes a day for 1 year.  Yearly screening should continue until it has been 15 years since you quit.  Yearly screening should stop if you develop a health problem that would prevent you from having lung cancer treatment.  Breast Cancer  Practice breast self-awareness. This means understanding how your breasts normally appear and feel.  It also means doing regular breast self-exams. Let your health care provider know about any changes, no matter how small.  If you are in your 58s  or 18s, you should have a clinical breast exam (CBE) by a health care provider every 1-3 years as part of a regular health exam.  If you are 58 or older, have a CBE every year. Also consider having a breast X-ray (mammogram) every year.  If you have a family history of breast cancer, talk to your health care provider about genetic screening.  If you are at high risk for breast cancer, talk to your health care provider about having an MRI and a mammogram every year.  Breast cancer gene (BRCA) assessment is recommended for women who have family members with BRCA-related cancers. BRCA-related cancers  include:  Breast.  Ovarian.  Tubal.  Peritoneal cancers.  Results of the assessment will determine the need for genetic counseling and BRCA1 and BRCA2 testing. Cervical Cancer Your health care provider may recommend that you be screened regularly for cancer of the pelvic organs (ovaries, uterus, and vagina). This screening involves a pelvic examination, including checking for microscopic changes to the surface of your cervix (Pap test). You may be encouraged to have this screening done every 3 years, beginning at age 15.  For women ages 58-65, health care providers may recommend pelvic exams and Pap testing every 3 years, or they may recommend the Pap and pelvic exam, combined with testing for human papilloma virus (HPV), every 5 years. Some types of HPV increase your risk of cervical cancer. Testing for HPV may also be done on women of any age with unclear Pap test results.  Other health care providers may not recommend any screening for nonpregnant women who are considered low risk for pelvic cancer and who do not have symptoms. Ask your health care provider if a screening pelvic exam is right for you.  If you have had past treatment for cervical cancer or a condition that could lead to cancer, you need Pap tests and screening for cancer for at least 20 years after your treatment. If Pap tests have been discontinued, your risk factors (such as having a new sexual partner) need to be reassessed to determine if screening should resume. Some women have medical problems that increase the chance of getting cervical cancer. In these cases, your health care provider may recommend more frequent screening and Pap tests. Colorectal Cancer  This type of cancer can be detected and often prevented.  Routine colorectal cancer screening usually begins at 58 years of age and continues through 58 years of age.  Your health care provider may recommend screening at an earlier age if you have risk factors for  colon cancer.  Your health care provider may also recommend using home test kits to check for hidden blood in the stool.  A small camera at the end of a tube can be used to examine your colon directly (sigmoidoscopy or colonoscopy). This is done to check for the earliest forms of colorectal cancer.  Routine screening usually begins at age 30.  Direct examination of the colon should be repeated every 5-10 years through 58 years of age. However, you may need to be screened more often if early forms of precancerous polyps or small growths are found. Skin Cancer  Check your skin from head to toe regularly.  Tell your health care provider about any new moles or changes in moles, especially if there is a change in a mole's shape or color.  Also tell your health care provider if you have a mole that is larger than the size of a pencil eraser.  Always use sunscreen. Apply sunscreen liberally and repeatedly throughout the day.  Protect yourself by wearing long sleeves, pants, a wide-brimmed hat, and sunglasses whenever you are outside. HEART DISEASE, DIABETES, AND HIGH BLOOD PRESSURE   High blood pressure causes heart disease and increases the risk of stroke. High blood pressure is more likely to develop in:  People who have blood pressure in the high end of the normal range (130-139/85-89 mm Hg).  People who are overweight or obese.  People who are African American.  If you are 35-13 years of age, have your blood pressure checked every 3-5 years. If you are 13 years of age or older, have your blood pressure checked every year. You should have your blood pressure measured twice--once when you are at a hospital or clinic, and once when you are not at a hospital or clinic. Record the average of the two measurements. To check your blood pressure when you are not at a hospital or clinic, you can use:  An automated blood pressure machine at a pharmacy.  A home blood pressure monitor.  If you  are between 63 years and 73 years old, ask your health care provider if you should take aspirin to prevent strokes.  Have regular diabetes screenings. This involves taking a blood sample to check your fasting blood sugar level.  If you are at a normal weight and have a low risk for diabetes, have this test once every three years after 58 years of age.  If you are overweight and have a high risk for diabetes, consider being tested at a younger age or more often. PREVENTING INFECTION  Hepatitis B  If you have a higher risk for hepatitis B, you should be screened for this virus. You are considered at high risk for hepatitis B if:  You were born in a country where hepatitis B is common. Ask your health care provider which countries are considered high risk.  Your parents were born in a high-risk country, and you have not been immunized against hepatitis B (hepatitis B vaccine).  You have HIV or AIDS.  You use needles to inject street drugs.  You live with someone who has hepatitis B.  You have had sex with someone who has hepatitis B.  You get hemodialysis treatment.  You take certain medicines for conditions, including cancer, organ transplantation, and autoimmune conditions. Hepatitis C  Blood testing is recommended for:  Everyone born from [birth year] through 1963.  Anyone with known risk factors for hepatitis C. Sexually transmitted infections (STIs)  You should be screened for sexually transmitted infections (STIs) including gonorrhea and chlamydia if:  You are sexually active and are younger than 58 years of age.  You are older than 58 years of age and your health care provider tells you that you are at risk for this type of infection.  Your sexual activity has changed since you were last screened and you are at an increased risk for chlamydia or gonorrhea. Ask your health care provider if you are at risk.  If you do not have HIV, but are at risk, it may be recommended that you  take a prescription medicine daily to prevent HIV infection. This is called pre-exposure prophylaxis (PrEP). You are considered at risk if:  You are sexually active and do not regularly use condoms or know the HIV status of your partner(s).  You take drugs by injection.  You are sexually active with a partner who has HIV. Talk with your health  care provider about whether you are at high risk of being infected with HIV. If you choose to begin PrEP, you should first be tested for HIV. You should then be tested every 3 months for as long as you are taking PrEP.  PREGNANCY   If you are premenopausal and you may become pregnant, ask your health care provider about preconception counseling.  If you may become pregnant, take 400 to 800 micrograms (mcg) of folic acid every day.  If you want to prevent pregnancy, talk to your health care provider about birth control (contraception). OSTEOPOROSIS AND MENOPAUSE   Osteoporosis is a disease in which the bones lose minerals and strength with aging. This can result in serious bone fractures. Your risk for osteoporosis can be identified using a bone density scan.  If you are 7 years of age or older, or if you are at risk for osteoporosis and fractures, ask your health care provider if you should be screened.  Ask your health care provider whether you should take a calcium or vitamin D supplement to lower your risk for osteoporosis.  Menopause may have certain physical symptoms and risks.  Hormone replacement therapy may reduce some of these symptoms and risks. Talk to your health care provider about whether hormone replacement therapy is right for you.  HOME CARE INSTRUCTIONS   Schedule regular health, dental, and eye exams.  Stay current with your immunizations.   Do not use any tobacco products including cigarettes, chewing tobacco, or electronic cigarettes.  If you are pregnant, do not drink alcohol.  If you are breastfeeding, limit how  much and how often you drink alcohol.  Limit alcohol intake to no more than 1 drink per day for nonpregnant women. One drink equals 12 ounces of beer, 5 ounces of wine, or 1 ounces of hard liquor.  Do not use street drugs.  Do not share needles.  Ask your health care provider for help if you need support or information about quitting drugs.  Tell your health care provider if you often feel depressed.  Tell your health care provider if you have ever been abused or do not feel safe at home.   This information is not intended to replace advice given to you by your health care provider. Make sure you discuss any questions you have with your health care provider.   Document Released: 02/27/2011 Document Revised: 09/04/2014 Document Reviewed: 07/16/2013 Elsevier Interactive Patient Education [employer].

## 2016-04-13 ENCOUNTER — Other Ambulatory Visit: Payer: Self-pay | Admitting: Internal Medicine

## 2016-04-13 NOTE — Telephone Encounter (Signed)
Sent to the pharmacy by e-scribe. 

## 2016-04-15 ENCOUNTER — Other Ambulatory Visit: Payer: Self-pay | Admitting: Internal Medicine

## 2016-04-17 NOTE — Telephone Encounter (Signed)
Sent to the pharmacy by e-scribe. 

## 2016-07-06 ENCOUNTER — Other Ambulatory Visit: Payer: Self-pay | Admitting: Internal Medicine

## 2016-09-25 ENCOUNTER — Other Ambulatory Visit: Payer: Self-pay | Admitting: Internal Medicine

## 2016-09-28 NOTE — Telephone Encounter (Signed)
Sent to the pharmacy by e-scribe.  Scheduled for cpx on 03/16/17.

## 2016-10-12 ENCOUNTER — Other Ambulatory Visit: Payer: Self-pay | Admitting: Internal Medicine

## 2016-10-13 NOTE — Telephone Encounter (Signed)
Sent to the pharmacy by e-scribe.  Pt has upcoming cpx on 03/16/17.

## 2016-10-28 ENCOUNTER — Other Ambulatory Visit: Payer: Self-pay | Admitting: Internal Medicine

## 2016-11-26 HISTORY — PX: OTHER SURGICAL HISTORY: SHX169

## 2016-11-28 ENCOUNTER — Other Ambulatory Visit: Payer: Self-pay | Admitting: Internal Medicine

## 2016-12-27 ENCOUNTER — Other Ambulatory Visit: Payer: Self-pay | Admitting: Internal Medicine

## 2017-03-09 ENCOUNTER — Other Ambulatory Visit: Payer: BC Managed Care – PPO

## 2017-03-15 NOTE — Progress Notes (Signed)
Chief Complaint  Patient presents with  . Annual Exam    HPI: Patient  Audrey Jenkins  61 y.o. comes in today for Preventive Health Care visit  And chronic disease management  Hyperlipidemia taking medicine without side effect noted.  Battling with weight tends to eat mood and has carb cravings  On low-dose Lexapro for hot flashes with some help. Is afraid to go off get hot flashes again.  Headaches not too bad takes medicine about twice a month the most.  Take mobiv 7.5 mg a day mostly for hand pain .  Declines mammogram because she had a bad experience in the past no first-degree relative with breast cancer  May be due for colonoscopy soon last one was done Dr. Sharlett Iles. Negative family history.  Is on Flonase also that is helpful.  States she has significant vaginal dryness and a snotty used infection would like to try a vaginal estrogen it is not made  from pregnant course urine.  Health Maintenance  Topic Date Due  . Hepatitis C Screening  03/16/2018 (Originally Oct 08, 1955)  . HIV Screening  03/16/2018 (Originally 09/11/1970)  . INFLUENZA VACCINE  03/28/2017  . PAP SMEAR  03/01/2018  . COLONOSCOPY  07/26/2020  . 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 GI /GU: No adominal pain, vomiting, change in bowel habits. No blood in the stool. No significant GU symptoms. SKIN/HEME: ,no acute skin rashes suspicious lesions or bleeding. No lymphadenopathy, nodules, masses.  NEURO/ PSYCH:  No neurologic signs such as weakness  numbness. No depression anxiety. IMM/ Allergy: No unusual infections.  Allergy .   REST of 12 system review negative except as per HPI   Past Medical History:  Diagnosis Date  . Abnormal LFTs   . Allergy   . Depression   . Fatty liver    on [place] Jan 2011  . Headache(784.0)   . Hyperlipidemia    Inital 400's  . Mood disorder ([place])   . Osteoarthritis   . Primiparous   . Snoring    ENT [facility] Jan 2011 nasal obstruction and cryptic tonsils    Past Surgical History:  Procedure Laterality Date  . BREAST SURGERY  1995   bx  . dental implant Left 11/2016    Family History  Problem Relation Age of Onset  . Arthritis Mother   . COPD Mother   . Dementia Mother   . Endometrial cancer Mother   . Skin cancer Mother   . Osteoporosis Mother   . Other Mother        dementia  . Heart attack Father   . Hypertension Father        cabd Mi 39 died CHF  . Heart failure Father   . Heart disease Father   . Sleep apnea Sister        improved with weight loss  . Arthritis Sister   . Coronary artery disease Sister   . Vasculitis Sister     Social History   Social History  .  Marital status: Married    Spouse name: N/A  . Number of children: N/A  . Years of education: N/A   Social History Main Topics  . Smoking status: Never Smoker  . Smokeless tobacco: Never Used  . Alcohol use 0.0 oz/week     Comment: socially  . Drug use: No  . Sexual activity: Not Asked   Other Topics Concern  . None   Social History Narrative   Married   HH of 2   Pet Kittens   Works with Geophysicist/field seismologist    employed [employer]  Teaches  Full time    6 Energy manager some older    Neg tobacco ets rare etoh             Outpatient Medications Prior to Visit  Medication Sig Dispense Refill  . atorvastatin (LIPITOR) 80 MG tablet TAKE 1 TABLET EVERY DAY 90 tablet 0  . eletriptan (RELPAX) 40 MG tablet TAKE 1 TABLET BY MOUTH AS NEEDED FOR MIGRAINE OR HEADACHE. MAY REPEAT IN 2  HOURS IF NEEDED 9 tablet 1  . escitalopram (LEXAPRO) 10 MG tablet TAKE 1 TABLET EVERY DAY 90 tablet 1  . meloxicam (MOBIC) 15 MG tablet TAKE 1 TABLET BY MOUTH DAILY. 30 tablet 3  . MULTIPLE VITAMIN PO Take 1 tablet by mouth daily.     No facility-administered medications prior to visit.      EXAM:  BP 110/80 (BP Location: Left Arm, Patient Position: Sitting, Cuff Size: Normal)   Pulse (!) 59   Temp 98 F (36.7 C) (Oral)   Ht 5' 3.75" (1.619 m)   Wt 226 lb 8 oz (102.7 kg)   BMI 39.18 kg/m   Body mass index is 39.18 kg/m. Wt Readings from Last 3 Encounters:  03/16/17 226 lb 8 oz (102.7 kg)  03/13/16 220 lb 8 oz (100 kg)  09/08/15 213 lb 6.4 oz (96.8 kg)    Physical Exam: Vital signs reviewed CWU:GQBV is a well-developed well-nourished alert cooperative    who appearsr stated age in no acute distress.  HEENT: normocephalic atraumatic , Eyes: PERRL EOM's full, conjunctiva clear, Nares: paten,t no deformity discharge or tenderness., Ears: no deformity EAC's clear TMs with normal landmarks. Mouth: clear OP, no lesions, edema.  Moist mucous membranes. Dentition in adequate repair. NECK: supple without masses, thyromegaly or bruits. CHEST/PULM:  Clear to auscultation and percussion breath sounds equal no wheeze , rales or rhonchi. No chest wall deformities or tenderness. Breast: normal by inspection . No dimpling, discharge, masses, tenderness or discharge . CV: PMI is nondisplaced, S1 S2 no gallops, murmurs, rubs. Peripheral pulses are full without delay.No JVD .  ABDOMEN: Bowel sounds normal nontender  No guard or rebound, no hepato splenomegal no CVA tenderness.  No hernia. Extremtities:  No clubbing cyanosis or edema, no acute joint swelling or redness no focal atrophy NEURO:  Oriented x3, cranial nerves 3-12 appear to be intact, no obvious focal weakness,gait within normal limits no abnormal reflexes or asymmetrical SKIN: No acute rashes normal turgor, color, no bruising or  petechiae. PSYCH: Oriented, good eye contact, no obvious depression anxiety, cognition and judgment appear normal. LN: no cervical axillary inguinal adenopathy    BP Readings from Last 3 Encounters:  03/16/17 110/80  03/13/16 130/80  09/08/15 120/72    Lab results reviewed with patient   ASSESSMENT AND PLAN:  Discussed the following assessment and plan:  Visit for preventive health examination - Plan: Basic metabolic panel, CBC  with Differential/Platelet, Hepatic function panel, Lipid panel, TSH, Hepatitis C antibody  Hyperlipidemia, unspecified hyperlipidemia type - Plan: Basic metabolic panel, CBC with Differential/Platelet, Hepatic function panel, Lipid panel, TSH, Hepatitis C antibody  Medication management - low dose ssri hot fluses  - Plan: Basic metabolic panel, CBC with Differential/Platelet, Hepatic function panel, Lipid panel, TSH, Hepatitis C antibody  BMI 38.0-38.9,adult - Plan: Basic metabolic panel, CBC with Differential/Platelet, Hepatic function panel, Lipid panel, TSH, Hepatitis C antibody  Need for hepatitis C screening test - Plan: Hepatitis C antibody  Chronic hand pain, unspecified laterality  Menopausal vaginal dryness - faile otc will look into  acceptable estrogen cream alternative   Mammogram declined Discussed status lab monitoring today discussed strategies in regard to the weight and healthy eating she does have some mood eating likes DE carbohydrate issues encouragement and confidence to go forward. She certainly is aware of the health risk.seems to do better   During school year.  Patient Care Team: Panosh, Standley Brooking, MD as PCP - General Rozetta Nunnery, MD Patient Instructions   Keep trying   Healthy life style . Try the 2 weeks  No processed carbs   Avoid in house  Get outside daylight daily  And  Plan for the vulnerable times"      Can try dec lexapro to 10 alternating with 5 mg  But ok to stay on meds.  Let me know if you find an  estrogen prep that is acceptable to you.    If labs oki the yearly check up or if you want more strategies about  Healthy eating ...    Preventive Care 40-64 Years, Female Preventive care refers to lifestyle choices and visits with your health care provider that can promote health and wellness. What does preventive care include?  A yearly physical exam. This is also called an annual well check.  Dental exams once or twice a year.  Routine eye exams. Ask your health care provider how often you should have your eyes checked.  Personal lifestyle choices, including: ? Daily care of your teeth and gums. ? Regular physical activity. ? Eating a healthy diet. ? Avoiding tobacco and drug use. ? Limiting alcohol use. ? Practicing safe sex. ? Taking low-dose aspirin daily starting at age 68. ? Taking vitamin and mineral supplements as recommended by your health care provider. What happens during an annual well check? The services and screenings done by your health care provider during your annual well check will depend on your age, overall health, lifestyle risk factors, and family history of disease. Counseling Your health care provider may ask you questions about your:  Alcohol use.  Tobacco use.  Drug use.  Emotional well-being.  Home and relationship well-being.  Sexual activity.  Eating habits.  Work and work Statistician.  Method of birth control.  Menstrual cycle.  Pregnancy history.  Screening You may have the following tests or measurements:  Height, weight, and BMI.  Blood pressure.  Lipid and cholesterol levels. These may be checked every 5 years, or more frequently if you are over 64 years old.  Skin check.  Lung cancer screening. You may have this screening every year starting at age 41 if you have a 30-pack-year history of smoking and currently smoke or have quit within the past 15 years.  Fecal occult blood test (FOBT) of the stool. You may have  this test every year starting at age 46.  Flexible sigmoidoscopy or colonoscopy. You may have a sigmoidoscopy  every 5 years or a colonoscopy every 10 years starting at age 24.  Hepatitis C blood test.  Hepatitis B blood test.  Sexually transmitted disease (STD) testing.  Diabetes screening. This is done by checking your blood sugar (glucose) after you have not eaten for a while (fasting). You may have this done every 1-3 years.  Mammogram. This may be done every 1-2 years. Talk to your health care provider about when you should start having regular mammograms. This may depend on whether you have a family history of breast cancer.  BRCA-related cancer screening. This may be done if you have a family history of breast, ovarian, tubal, or peritoneal cancers.  Pelvic exam and Pap test. This may be done every 3 years starting at age 8. Starting at age 16, this may be done every 5 years if you have a Pap test in combination with an HPV test.  Bone density scan. This is done to screen for osteoporosis. You may have this scan if you are at high risk for osteoporosis.  Discuss your test results, treatment options, and if necessary, the need for more tests with your health care provider. Vaccines Your health care provider may recommend certain vaccines, such as:  Influenza vaccine. This is recommended every year.  Tetanus, diphtheria, and acellular pertussis (Tdap, Td) vaccine. You may need a Td booster every 10 years.  Varicella vaccine. You may need this if you have not been vaccinated.  Zoster vaccine. You may need this after age 45.  Measles, mumps, and rubella (MMR) vaccine. You may need at least one dose of MMR if you were born in 1957 or later. You may also need a second dose.  Pneumococcal 13-valent conjugate (PCV13) vaccine. You may need this if you have certain conditions and were not previously vaccinated.  Pneumococcal polysaccharide (PPSV23) vaccine. You may need one or two  doses if you smoke cigarettes or if you have certain conditions.  Meningococcal vaccine. You may need this if you have certain conditions.  Hepatitis A vaccine. You may need this if you have certain conditions or if you travel or work in places where you may be exposed to hepatitis A.  Hepatitis B vaccine. You may need this if you have certain conditions or if you travel or work in places where you may be exposed to hepatitis B.  Haemophilus influenzae type b (Hib) vaccine. You may need this if you have certain conditions.  Talk to your health care provider about which screenings and vaccines you need and how often you need them. This information is not intended to replace advice given to you by your health care provider. Make sure you discuss any questions you have with your health care provider. Document Released: 09/10/2015 Document Revised: 05/03/2016 Document Reviewed: 06/15/2015 Elsevier Interactive Patient Education  2017 [place] K. Panosh M.D.

## 2017-03-16 ENCOUNTER — Ambulatory Visit (INDEPENDENT_AMBULATORY_CARE_PROVIDER_SITE_OTHER): Payer: BC Managed Care – PPO | Admitting: Internal Medicine

## 2017-03-16 ENCOUNTER — Encounter: Payer: Self-pay | Admitting: Internal Medicine

## 2017-03-16 VITALS — BP 110/80 | HR 59 | Temp 98.0°F | Ht 63.75 in | Wt 226.5 lb

## 2017-03-16 DIAGNOSIS — Z0000 Encounter for general adult medical examination without abnormal findings: Secondary | ICD-10-CM | POA: Diagnosis not present

## 2017-03-16 DIAGNOSIS — Z79899 Other long term (current) drug therapy: Secondary | ICD-10-CM | POA: Diagnosis not present

## 2017-03-16 DIAGNOSIS — M79643 Pain in unspecified hand: Secondary | ICD-10-CM | POA: Diagnosis not present

## 2017-03-16 DIAGNOSIS — E785 Hyperlipidemia, unspecified: Secondary | ICD-10-CM

## 2017-03-16 DIAGNOSIS — N951 Menopausal and female climacteric states: Secondary | ICD-10-CM

## 2017-03-16 DIAGNOSIS — Z5320 Procedure and treatment not carried out because of patient's decision for unspecified reasons: Secondary | ICD-10-CM | POA: Diagnosis not present

## 2017-03-16 DIAGNOSIS — Z1159 Encounter for screening for other viral diseases: Secondary | ICD-10-CM

## 2017-03-16 DIAGNOSIS — G8929 Other chronic pain: Secondary | ICD-10-CM | POA: Diagnosis not present

## 2017-03-16 DIAGNOSIS — Z6838 Body mass index (BMI) 38.0-38.9, adult: Secondary | ICD-10-CM

## 2017-03-16 LAB — HEPATIC FUNCTION PANEL
ALT: 19 U/L (ref 0–35)
AST: 18 U/L (ref 0–37)
Albumin: 4.1 g/dL (ref 3.5–5.2)
Alkaline Phosphatase: 132 U/L — ABNORMAL HIGH (ref 39–117)
Bilirubin, Direct: 0.1 mg/dL (ref 0.0–0.3)
TOTAL PROTEIN: 6.7 g/dL (ref 6.0–8.3)
Total Bilirubin: 0.6 mg/dL (ref 0.2–1.2)

## 2017-03-16 LAB — BASIC METABOLIC PANEL
BUN: 14 mg/dL (ref 6–23)
CHLORIDE: 107 meq/L (ref 96–112)
CO2: 26 mEq/L (ref 19–32)
Calcium: 9.2 mg/dL (ref 8.4–10.5)
Creatinine, Ser: 0.7 mg/dL (ref 0.40–1.20)
GFR: 90.87 mL/min (ref >60.00)
Glucose, Bld: 94 mg/dL (ref 70–99)
POTASSIUM: 4.1 meq/L (ref 3.5–5.1)
Sodium: 141 mEq/L (ref 135–145)

## 2017-03-16 LAB — CBC WITH DIFFERENTIAL/PLATELET
BASOS PCT: 0.8 % (ref 0.0–3.0)
Basophils Absolute: 0 10*3/uL (ref 0.0–0.1)
EOS PCT: 3.6 % (ref 0.0–5.0)
Eosinophils Absolute: 0.2 10*3/uL (ref 0.0–0.7)
HEMATOCRIT: 37.3 % (ref 36.0–46.0)
HEMOGLOBIN: 12.3 g/dL (ref 12.0–15.0)
LYMPHS PCT: 31.4 % (ref 12.0–46.0)
Lymphs Abs: 1.9 10*3/uL (ref 0.7–4.0)
MCHC: 33 g/dL (ref 30.0–36.0)
MCV: 85.4 fl (ref 78.0–100.0)
Monocytes Absolute: 0.6 10*3/uL (ref 0.1–1.0)
Monocytes Relative: 9.7 % (ref 3.0–12.0)
Neutro Abs: 3.3 10*3/uL (ref 1.4–7.7)
Neutrophils Relative %: 54.5 % (ref 43.0–77.0)
Platelets: 271 10*3/uL (ref 150.0–400.0)
RBC: 4.37 Mil/uL (ref 3.87–5.11)
RDW: 14 % (ref 11.5–15.5)
WBC: 6.1 10*3/uL (ref 4.0–10.5)

## 2017-03-16 LAB — LIPID PANEL
CHOLESTEROL: 232 mg/dL — AB (ref 0–200)
HDL: 60.7 mg/dL (ref >39.00)
LDL CALC: 151 mg/dL — AB (ref 0–99)
NonHDL: 171.64
TRIGLYCERIDES: 102 mg/dL (ref 0.0–149.0)
Total CHOL/HDL Ratio: 4
VLDL: 20.4 mg/dL (ref 0.0–40.0)

## 2017-03-16 LAB — TSH: TSH: 2.32 u[IU]/mL (ref 0.35–4.50)

## 2017-03-16 NOTE — Patient Instructions (Addendum)
Keep trying   Healthy life style . Try the 2 weeks  No processed carbs   Avoid in house  Get outside daylight daily  And  Plan for the vulnerable times"      Can try dec lexapro to 10 alternating with 5 mg  But ok to stay on meds.  Let me know if you find an estrogen prep that is acceptable to you.    If labs oki the yearly check up or if you want more strategies about  Healthy eating ...    Preventive Care 40-64 Years, Female Preventive care refers to lifestyle choices and visits with your health care provider that can promote health and wellness. What does preventive care include?  A yearly physical exam. This is also called an annual well check.  Dental exams once or twice a year.  Routine eye exams. Ask your health care provider how often you should have your eyes checked.  Personal lifestyle choices, including: ? Daily care of your teeth and gums. ? Regular physical activity. ? Eating a healthy diet. ? Avoiding tobacco and drug use. ? Limiting alcohol use. ? Practicing safe sex. ? Taking low-dose aspirin daily starting at age 9. ? Taking vitamin and mineral supplements as recommended by your health care provider. What happens during an annual well check? The services and screenings done by your health care provider during your annual well check will depend on your age, overall health, lifestyle risk factors, and family history of disease. Counseling Your health care provider may ask you questions about your:  Alcohol use.  Tobacco use.  Drug use.  Emotional well-being.  Home and relationship well-being.  Sexual activity.  Eating habits.  Work and work Statistician.  Method of birth control.  Menstrual cycle.  Pregnancy history.  Screening You may have the following tests or measurements:  Height, weight, and BMI.  Blood pressure.  Lipid and cholesterol levels. These may be checked every 5 years, or more frequently if you are over 69 years  old.  Skin check.  Lung cancer screening. You may have this screening every year starting at age 85 if you have a 30-pack-year history of smoking and currently smoke or have quit within the past 15 years.  Fecal occult blood test (FOBT) of the stool. You may have this test every year starting at age 49.  Flexible sigmoidoscopy or colonoscopy. You may have a sigmoidoscopy every 5 years or a colonoscopy every 10 years starting at age 9.  Hepatitis C blood test.  Hepatitis B blood test.  Sexually transmitted disease (STD) testing.  Diabetes screening. This is done by checking your blood sugar (glucose) after you have not eaten for a while (fasting). You may have this done every 1-3 years.  Mammogram. This may be done every 1-2 years. Talk to your health care provider about when you should start having regular mammograms. This may depend on whether you have a family history of breast cancer.  BRCA-related cancer screening. This may be done if you have a family history of breast, ovarian, tubal, or peritoneal cancers.  Pelvic exam and Pap test. This may be done every 3 years starting at age 53. Starting at age 2, this may be done every 5 years if you have a Pap test in combination with an HPV test.  Bone density scan. This is done to screen for osteoporosis. You may have this scan if you are at high risk for osteoporosis.  Discuss your test results, treatment  options, and if necessary, the need for more tests with your health care provider. Vaccines Your health care provider may recommend certain vaccines, such as:  Influenza vaccine. This is recommended every year.  Tetanus, diphtheria, and acellular pertussis (Tdap, Td) vaccine. You may need a Td booster every 10 years.  Varicella vaccine. You may need this if you have not been vaccinated.  Zoster vaccine. You may need this after age 52.  Measles, mumps, and rubella (MMR) vaccine. You may need at least one dose of MMR if you were  born in 1957 or later. You may also need a second dose.  Pneumococcal 13-valent conjugate (PCV13) vaccine. You may need this if you have certain conditions and were not previously vaccinated.  Pneumococcal polysaccharide (PPSV23) vaccine. You may need one or two doses if you smoke cigarettes or if you have certain conditions.  Meningococcal vaccine. You may need this if you have certain conditions.  Hepatitis A vaccine. You may need this if you have certain conditions or if you travel or work in places where you may be exposed to hepatitis A.  Hepatitis B vaccine. You may need this if you have certain conditions or if you travel or work in places where you may be exposed to hepatitis B.  Haemophilus influenzae type b (Hib) vaccine. You may need this if you have certain conditions.  Talk to your health care provider about which screenings and vaccines you need and how often you need them. This information is not intended to replace advice given to you by your health care provider. Make sure you discuss any questions you have with your health care provider. Document Released: 09/10/2015 Document Revised: 05/03/2016 Document Reviewed: 06/15/2015 Elsevier Interactive Patient Education  2017 [employer].

## 2017-03-17 LAB — HEPATITIS C ANTIBODY: HCV Ab: NEGATIVE

## 2017-03-20 ENCOUNTER — Other Ambulatory Visit: Payer: Self-pay | Admitting: Emergency Medicine

## 2017-03-20 ENCOUNTER — Telehealth: Payer: Self-pay | Admitting: Emergency Medicine

## 2017-03-20 DIAGNOSIS — E785 Hyperlipidemia, unspecified: Secondary | ICD-10-CM

## 2017-03-20 MED ORDER — EZETIMIBE 10 MG PO TABS: 10.0000 mg | ORAL_TABLET | Freq: Every day | ORAL | 5 refills | Status: DC

## 2017-03-20 NOTE — Telephone Encounter (Signed)
Left a VM for patient regarding making a lab appointment to repeat hepatic and lipid panel in 3 months. Labs have been placed

## 2017-03-21 ENCOUNTER — Telehealth: Payer: Self-pay | Admitting: Emergency Medicine

## 2017-03-21 MED ORDER — ESTRADIOL 0.1 MG/GM VA CREA: 1.0000 | TOPICAL_CREAM | Freq: Every day | VAGINAL | 12 refills | Status: DC

## 2017-03-21 NOTE — Addendum Note (Signed)
Addended byBurnis Medin on: 03/21/2017 04:42 PM   Modules accepted: Orders

## 2017-03-21 NOTE — Telephone Encounter (Signed)
Sent to her pharmacy. 

## 2017-03-21 NOTE — Telephone Encounter (Signed)
Patient would like a new rx for Estrace 0.01% cream (plant based). Pt does not want Premarin from (horses). Please advise.

## 2017-03-25 ENCOUNTER — Other Ambulatory Visit: Payer: Self-pay | Admitting: Internal Medicine

## 2017-04-07 ENCOUNTER — Other Ambulatory Visit: Payer: Self-pay | Admitting: Internal Medicine

## 2017-10-03 ENCOUNTER — Other Ambulatory Visit: Payer: Self-pay | Admitting: Internal Medicine

## 2017-10-04 NOTE — Telephone Encounter (Signed)
Medication sent in electronically.  

## 2017-10-05 ENCOUNTER — Ambulatory Visit: Payer: Self-pay | Admitting: *Deleted
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
## 2017-10-08 ENCOUNTER — Other Ambulatory Visit: Payer: Self-pay | Admitting: Internal Medicine

## 2017-10-08 ENCOUNTER — Encounter: Payer: Self-pay | Admitting: Internal Medicine

## 2017-10-08 ENCOUNTER — Ambulatory Visit: Payer: BC Managed Care – PPO | Admitting: Internal Medicine

## 2017-10-08 VITALS — BP 126/70 | HR 72 | Temp 98.2°F | Wt 227.0 lb

## 2017-10-08 DIAGNOSIS — R51 Headache: Secondary | ICD-10-CM | POA: Diagnosis not present

## 2017-10-08 DIAGNOSIS — R519 Headache, unspecified: Secondary | ICD-10-CM

## 2017-10-08 MED ORDER — CYCLOBENZAPRINE HCL 5 MG PO TABS: 5.0000 mg | ORAL_TABLET | Freq: Every day | ORAL | 0 refills | Status: DC

## 2017-10-08 MED ORDER — ESCITALOPRAM OXALATE 10 MG PO TABS: 10.0000 mg | ORAL_TABLET | Freq: Every day | ORAL | 1 refills | Status: DC
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
## 2017-10-15 ENCOUNTER — Telehealth: Payer: Self-pay | Admitting: *Deleted

## 2017-10-15 NOTE — Telephone Encounter (Signed)
Patient would like to receive Shingrix vaccine. Okay to administer pending insurance coverage?

## 2017-10-15 NOTE — Telephone Encounter (Signed)
yes

## 2017-10-16 NOTE — Telephone Encounter (Signed)
Copied from [place]. Topic: Quick Communication - Office Called Patient >> Oct 16, 2017 12:21 PM Tye [place] wrote: Pt called and has a nurse visit on 2.26.19 @ 1:30 for the shingrix vaccine, pt will call insurance before visit to make sure its covered

## 2017-10-16 NOTE — Telephone Encounter (Signed)
Called patient and left message to return call to schedule nurse visit for Shingrix injection. When patient returns call, ok to schedule nurse visit. Advise patient to verify coverage with insurance prior to injection. Please let me know if/when appt is scheduled.

## 2017-10-23 ENCOUNTER — Ambulatory Visit (INDEPENDENT_AMBULATORY_CARE_PROVIDER_SITE_OTHER): Payer: BC Managed Care – PPO | Admitting: Family Medicine

## 2017-10-23 DIAGNOSIS — Z23 Encounter for immunization: Secondary | ICD-10-CM | POA: Diagnosis not present

## 2017-11-05 ENCOUNTER — Other Ambulatory Visit: Payer: Self-pay | Admitting: Internal Medicine

## 2017-12-03 ENCOUNTER — Ambulatory Visit: Payer: BC Managed Care – PPO | Admitting: Internal Medicine

## 2017-12-03 ENCOUNTER — Encounter: Payer: Self-pay | Admitting: Internal Medicine

## 2017-12-03 VITALS — BP 118/76 | HR 69 | Temp 98.0°F | Wt 228.0 lb

## 2017-12-03 DIAGNOSIS — J302 Other seasonal allergic rhinitis: Secondary | ICD-10-CM | POA: Diagnosis not present

## 2017-12-03 MED ORDER — PREDNISONE 20 MG PO TABS: 20.0000 mg | ORAL_TABLET | Freq: Two times a day (BID) | ORAL | 0 refills | Status: DC

## 2017-12-03 MED ORDER — MONTELUKAST SODIUM 10 MG PO TABS: 10.0000 mg | ORAL_TABLET | Freq: Every day | ORAL | 3 refills | Status: DC

## 2017-12-03 NOTE — Patient Instructions (Addendum)
  Add singular  For  Allergy suppression trial  Continue on your  flonase  .  5 days prednisone to decrease the inflammation.  We can do a allergy referral.

## 2017-12-03 NOTE — Progress Notes (Signed)
Chief Complaint  Patient presents with  . Allergies    Pt states that she has been taking Allegra and Flonase. Pt c/o runny nose, sneezing and eyes burning. Pt requesting to start allergy injections.    HPI: Audrey Jenkins 62 y.o. come in  [place]  Early February   Allergy sx  And  Weeks ago allergy attack and virus  Sore throat and headache   On flonase and allegra and at night  CTM and  Sudafed .     Sneeze and blow.  nose. Marland Kitchen Every day    sping and  Fall.  Is the worst  Would consider allergy shots if that would help  Pet cats .   Tobacco and perfume  Causes  Sx too.  But less exposed to this .  Eyes on fire.  Sx   Chemical and season.  Are her triggers  ROS: See pertinent positives and negatives per HPI. No cp sob   Past Medical History:  Diagnosis Date  . Abnormal LFTs   . Allergy   . Depression   . Fatty liver    on [place] Jan 2011  . Headache(784.0)   . Hyperlipidemia    Inital 400's  . Mood disorder ([place])   . Osteoarthritis   . Primiparous   . Snoring    ENT [facility] Jan 2011 nasal obstruction and cryptic tonsils    Family History  Problem Relation Age of Onset  . Arthritis Mother   . COPD Mother   . Dementia Mother   . Endometrial cancer Mother   . Skin cancer Mother   . Osteoporosis Mother   . Other Mother        dementia  . Heart attack Father   . Hypertension Father        cabd Mi 85 died CHF  . Heart failure Father   . Heart disease Father   . Sleep apnea Sister        improved with weight loss  . Arthritis Sister   . Coronary artery disease Sister   . Vasculitis Sister     Social History   Socioeconomic History  . Marital status: Married    Spouse name: Not on file  . Number of children: Not on file  . Years of education: Not on file  . Highest education level: Not on file  Occupational History  . Not on file  Social Needs  . Financial resource strain: Not on file  . Food insecurity:    Worry: Not on file    Inability: Not on file  .  Transportation needs:    Medical: Not on file    Non-medical: Not on file  Tobacco Use  . Smoking status: Never Smoker  . Smokeless tobacco: Never Used  Substance and Sexual Activity  . Alcohol use: Yes    Alcohol/week: 0.0 oz    Comment: socially  . Drug use: No  . Sexual activity: Not on file  Lifestyle  . Physical activity:    Days per week: Not on file    Minutes per session: Not on file  . Stress: Not on file  Relationships  . Social connections:    Talks on phone: Not on file    Gets together: Not on file    Attends religious service: Not on file    Active member of club or organization: Not on file    Attends meetings of clubs or organizations: Not on file  Relationship status: Not on file  Other Topics Concern  . 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 atorvastatin (LIPITOR) 80 MG tablet TAKE 1 TABLET EVERY DAY 90 tablet 2  . cyclobenzaprine (FLEXERIL) 5 MG tablet TAKE 1 TABLET (5 MG TOTAL) BY MOUTH AT BEDTIME. FOR HEADACHES 30 tablet 0  . eletriptan (RELPAX) 40 MG tablet TAKE 1 TABLET BY MOUTH AS NEEDED FOR MIGRAINE OR HEADACHE. MAY REPEAT IN 2 HOURS IF NEEDED 9 tablet 1  . escitalopram (LEXAPRO) 10 MG tablet Take 1 tablet (10 mg total) by mouth daily. 90 tablet 1  . estradiol (ESTRACE VAGINAL) 0.1 MG/GM vaginal cream Place 1 Applicatorful vaginally at bedtime. For 2 weeks and then 3 x per week or prn 42.5 g 12  . Fluticasone Propionate (FLONASE ALLERGY RELIEF NA) Place into the nose.    . meloxicam (MOBIC) 15 MG tablet TAKE 1 TABLET BY MOUTH DAILY. 30 tablet 3   No facility-administered medications prior to visit.      EXAM:  BP 118/76 (BP Location: Right Arm, Patient Position: Sitting,  Cuff Size: Normal)   Pulse 69   Temp 98 F (36.7 C) (Oral)   Wt 228 lb (103.4 kg)   BMI 39.44 kg/m   Body mass index is 39.44 kg/m.  GENERAL: vitals reviewed and listed above, alert, oriented, appears well hydrated and in no acute distress HEENT: atraumatic, conjunctiva  clear, no obvious abnormalities on inspection of external nose and ears congested  Face non tender  OP : no lesion edema or exudate  Eyes not red but  Rubbing a good bnit  NECK: no obvious masses on inspection palpation  LUNGS: clear to auscultation bilaterally, no wheezes, rales or rhonchi, good air movement CV: HRRR, no clubbing cyanosis or  peripheral edema nl cap refill  MS: moves all extremities without noticeable focal  abnormality PSYCH: pleasant and cooperative, no obvious depression or anxiety Lab Results  Component Value Date   WBC 6.1 03/16/2017   HGB 12.3 03/16/2017   HCT 37.3 03/16/2017   PLT 271.0 03/16/2017   GLUCOSE 94 03/16/2017   CHOL 232 (H) 03/16/2017   TRIG 102.0 03/16/2017   HDL 60.70 03/16/2017   LDLDIRECT 157.4 02/19/2014   LDLCALC 151 (H) 03/16/2017   ALT 19 03/16/2017   AST 18 03/16/2017   NA 141 03/16/2017   K 4.1 03/16/2017   CL 107 03/16/2017   CREATININE 0.70 03/16/2017   BUN 14 03/16/2017   CO2 26 03/16/2017   TSH 2.32 03/16/2017   BP Readings from Last 3 Encounters:  12/03/17 118/76  10/08/17 126/70  03/16/17 110/80    ASSESSMENT AND PLAN:  Discussed the following assessment and plan:  Seasonal allergies - Plan: Ambulatory referral to Allergy Allergid diathesis sx  On going not responding to    Routine suppression but add   Singular also HO and Counseled.   Will do a  Referral also . Get the cat out of th bedroom incase adding to issues  Total visit 15mins > 50% spent counseling and coordinating care as indicated in above note and in instructions  to patient .   -Patient advised to return or notify health care team  if  new concerns arise.  Patient Instructions    Add singular  For  Allergy suppression trial  Continue on your  flonase  .  5 days prednisone to decrease the inflammation.  We can do a allergy referral.              Standley Brooking. Rylin Seavey M.D.

## 2017-12-20 ENCOUNTER — Other Ambulatory Visit: Payer: Self-pay | Admitting: Internal Medicine

## 2017-12-26 ENCOUNTER — Ambulatory Visit: Payer: BC Managed Care – PPO

## 2018-01-04 ENCOUNTER — Encounter: Payer: Self-pay | Admitting: Allergy & Immunology

## 2018-01-04 ENCOUNTER — Ambulatory Visit (INDEPENDENT_AMBULATORY_CARE_PROVIDER_SITE_OTHER): Payer: BC Managed Care – PPO | Admitting: Allergy & Immunology

## 2018-01-04 VITALS — BP 118/74 | HR 69 | Temp 98.0°F | Resp 16 | Ht 63.0 in | Wt 228.0 lb

## 2018-01-04 DIAGNOSIS — J302 Other seasonal allergic rhinitis: Secondary | ICD-10-CM

## 2018-01-04 DIAGNOSIS — J683 Other acute and subacute respiratory conditions due to chemicals, gases, fumes and vapors: Secondary | ICD-10-CM

## 2018-01-04 DIAGNOSIS — J3089 Other allergic rhinitis: Secondary | ICD-10-CM | POA: Diagnosis not present

## 2018-01-04 MED ORDER — FLUTICASONE PROPIONATE 50 MCG/ACT NA SUSP: 1.0000 | Freq: Every day | NASAL | 5 refills | Status: DC

## 2018-01-04 MED ORDER — MONTELUKAST SODIUM 10 MG PO TABS: 10.0000 mg | ORAL_TABLET | Freq: Every day | ORAL | 5 refills | Status: DC

## 2018-01-04 MED ORDER — OLOPATADINE HCL 0.6 % NA SOLN: 1.0000 | Freq: Two times a day (BID) | NASAL | 5 refills | Status: DC

## 2018-01-04 MED ORDER — OLOPATADINE HCL 0.2 % OP SOLN: 2.0000 [drp] | Freq: Every day | OPHTHALMIC | 5 refills | Status: DC | PRN

## 2018-01-04 NOTE — Patient Instructions (Addendum)
1. Chronic rhinitis - Testing today showed: trees, weeds, grasses, indoor molds, outdoor molds, dust mites, cat and dog - Avoidance measures provided. - Continue with: Singulair (montelukast) 10mg  once daily, Flonase (fluticasone) one spray per nostril daily - Start taking: Flonase (fluticasone) one spray per nostril daily, Patanase (olopatadine) two sprays per nostril 1-2 times daily as needed and Pataday (olopatadine) one drop per eye twice daily as needed - You can use an extra dose of the antihistamine, if needed, for breakthrough symptoms.  - Consider nasal saline rinses 1-2 times daily to remove allergens from the nasal cavities as well as help with mucous clearance (this is especially helpful to do before the nasal sprays are given) - Consider allergy shots as a means of long-term control. - Allergy shots "re-train" and "reset" the immune system to ignore environmental allergens and decrease the resulting immune response to those allergens (sneezing, itchy watery eyes, runny nose, nasal congestion, etc).    - Allergy shots improve symptoms in 75-85% of patients.  -Call your insurance company to check on any co-payments.   2. Sensitivity to scents/perfumes - Avoid as much as you can. - Unfortunately, there is nothing that we can do to diagnose or treat this. - You could try using masks while at work, although I am not sure how successful this would be.   3. Return in about 3 months (around 04/06/2018).   Please inform us of any Emergency Department visits, hospitalizations, or changes in symptoms. Call us before going to the ED for breathing or allergy symptoms since we might be able to fit you in for a sick visit. Feel free to contact us anytime with any questions, problems, or concerns.  It was a pleasure to meet you today!  Websites that have reliable patient information: 1. American Academy of Asthma, Allergy, and Immunology: www.aaaai.org 2. Food Allergy Research and Education  (FARE): foodallergy.org 3. Mothers of Asthmatics: http://www.asthmacommunitynetwork.org 4. American College of Allergy, Asthma, and Immunology: www.acaai.org   Reducing Pollen Exposure  The American Academy of Allergy, Asthma and Immunology suggests the following steps to reduce your exposure to pollen during allergy seasons.    1. Do not hang sheets or clothing out to dry; pollen may collect on these items. 2. Do not mow lawns or spend time around freshly cut grass; mowing stirs up pollen. 3. Keep windows closed at night.  Keep car windows closed while driving. 4. Minimize morning activities outdoors, a time when pollen counts are usually at their highest. 5. Stay indoors as much as possible when pollen counts or humidity is high and on windy days when pollen tends to remain in the air longer. 6. Use air conditioning when possible.  Many air conditioners have filters that trap the pollen spores. 7. Use a HEPA room air filter to remove pollen form the indoor air you breathe.  Control of Mold Allergen   Mold and fungi can grow on a variety of surfaces provided certain temperature and moisture conditions exist.  Outdoor molds grow on plants, decaying vegetation and soil.  The major outdoor mold, Alternaria and Cladosporium, are found in very high numbers during hot and dry conditions.  Generally, a late Summer - Fall peak is seen for common outdoor fungal spores.  Rain will temporarily lower outdoor mold spore count, but counts rise rapidly when the rainy period ends.  The most important indoor molds are Aspergillus and Penicillium.  Dark, humid and poorly ventilated basements are ideal sites for mold growth.  The  next most common sites of mold growth are the bathroom and the kitchen.  Outdoor (Seasonal) Mold Control  Positive outdoor molds via skin testing: Alternaria, Cladosporium, Bipolaris (Helminthsporium), Drechslera (Curvalaria) and Mucor  1. Use air conditioning and keep windows  closed 2. Avoid exposure to decaying vegetation. 3. Avoid leaf raking. 4. Avoid grain handling. 5. Consider wearing a face mask if working in moldy areas.  6.   Indoor (Perennial) Mold Control   Positive indoor molds via skin testing: Aspergillus, Penicillium, Fusarium, Aureobasidium (Pullulara) and Rhizopus  1. Maintain humidity below 50%. 2. Clean washable surfaces with 5% bleach solution. 3. Remove sources e.g. contaminated carpets.     Control of House Dust Mite Allergen    House dust mites play a major role in allergic asthma and rhinitis.  They occur in environments with high humidity wherever human skin, the food for dust mites is found. High levels have been detected in dust obtained from mattresses, pillows, carpets, upholstered furniture, bed covers, clothes and soft toys.  The principal allergen of the house dust mite is found in its feces.  A gram of dust may contain 1,000 mites and 250,000 fecal particles.  Mite antigen is easily measured in the air during house cleaning activities.    1. Encase mattresses, including the box spring, and pillow, in an air tight cover.  Seal the zipper end of the encased mattresses with wide adhesive tape. 2. Wash the bedding in water of 130 degrees Farenheit weekly.  Avoid cotton comforters/quilts and flannel bedding: the most ideal bed covering is the dacron comforter. 3. Remove all upholstered furniture from the bedroom. 4. Remove carpets, carpet padding, rugs, and non-washable window drapes from the bedroom.  Wash drapes weekly or use plastic window coverings. 5. Remove all non-washable stuffed toys from the bedroom.  Wash stuffed toys weekly. 6. Have the room cleaned frequently with a vacuum cleaner and a damp dust-mop.  The patient should not be in a room which is being cleaned and should wait 1 hour after cleaning before going into the room. 7. Close and seal all heating outlets in the bedroom.  Otherwise, the room will become filled  with dust-laden air.  An electric heater can be used to heat the room. 8. Reduce indoor humidity to less than 50%.  Do not use a humidifier.  Control of Dog or Cat Allergen  Avoidance is the best way to manage a dog or cat allergy. If you have a dog or cat and are allergic to dog or cats, consider removing the dog or cat from the home. If you have a dog or cat but don't want to find it a new home, or if your family wants a pet even though someone in the household is allergic, here are some strategies that may help keep symptoms at bay:  1. Keep the pet out of your bedroom and restrict it to only a few rooms. Be advised that keeping the dog or cat in only one room will not limit the allergens to that room. 2. Don't pet, hug or kiss the dog or cat; if you do, wash your hands with soap and water. 3. High-efficiency particulate air (HEPA) cleaners run continuously in a bedroom or living room can reduce allergen levels over time. 4. Regular use of a high-efficiency vacuum cleaner or a central vacuum can reduce allergen levels. 5. Giving your dog or cat a bath at least once a week can reduce airborne allergen.  Allergy Shots  Allergies are the result of a chain reaction that starts in the immune system. Your immune system controls how your body defends itself. For instance, if you have an allergy to pollen, your immune system identifies pollen as an invader or allergen. Your immune system overreacts by producing antibodies called Immunoglobulin E (IgE). These antibodies travel to cells that release chemicals, causing an allergic reaction.  The concept behind allergy immunotherapy, whether it is received in the form of shots or tablets, is that the immune system can be desensitized to specific allergens that trigger allergy symptoms. Although it requires time and patience, the payback can be long-term relief.  How Do Allergy Shots Work?  Allergy shots work much like a vaccine. Your body responds to  injected amounts of a particular allergen given in increasing doses, eventually developing a resistance and tolerance to it. Allergy shots can lead to decreased, minimal or no allergy symptoms.  There generally are two phases: build-up and maintenance. Build-up often ranges from three to six months and involves receiving injections with increasing amounts of the allergens. The shots are typically given once or twice a week, though more rapid build-up schedules are sometimes used.  The maintenance phase begins when the most effective dose is reached. This dose is different for each person, depending on how allergic you are and your response to the build-up injections. Once the maintenance dose is reached, there are longer periods between injections, typically two to four weeks.  Occasionally doctors give cortisone-type shots that can temporarily reduce allergy symptoms. These types of shots are different and should not be confused with allergy immunotherapy shots.  Who Can Be Treated with Allergy Shots?  Allergy shots may be a good treatment approach for people with allergic rhinitis (hay fever), allergic asthma, conjunctivitis (eye allergy) or stinging insect allergy.   Before deciding to begin allergy shots, you should consider:  . The length of allergy season and the severity of your symptoms . Whether medications and/or changes to your environment can control your symptoms . Your desire to avoid long-term medication use . Time: allergy immunotherapy requires a major time commitment . Cost: may vary depending on your insurance coverage  Allergy shots for children age 49 and older are effective and often well tolerated. They might prevent the onset of new allergen sensitivities or the progression to asthma.  Allergy shots are not started on patients who are pregnant but can be continued on patients who become pregnant while receiving them. In some patients with other medical conditions or  who take certain common medications, allergy shots may be of risk. It is important to mention other medications you talk to your allergist.   When Will I Feel Better?  Some may experience decreased allergy symptoms during the build-up phase. For others, it may take as long as 12 months on the maintenance dose. If there is no improvement after a year of maintenance, your allergist will discuss other treatment options with you.  If you aren't responding to allergy shots, it may be because there is not enough dose of the allergen in your vaccine or there are missing allergens that were not identified during your allergy testing. Other reasons could be that there are high levels of the allergen in your environment or major exposure to non-allergic triggers like tobacco smoke.  What Is the Length of Treatment?  Once the maintenance dose is reached, allergy shots are generally continued for three to five years. The decision to stop should be discussed with  your allergist at that time. Some people may experience a permanent reduction of allergy symptoms. Others may relapse and a longer course of allergy shots can be considered.  What Are the Possible Reactions?  The two types of adverse reactions that can occur with allergy shots are local and systemic. Common local reactions include very mild redness and swelling at the injection site, which can happen immediately or several hours after. A systemic reaction, which is less common, affects the entire body or a particular body system. They are usually mild and typically respond quickly to medications. Signs include increased allergy symptoms such as sneezing, a stuffy nose or hives.  Rarely, a serious systemic reaction called anaphylaxis can develop. Symptoms include swelling in the throat, wheezing, a feeling of tightness in the chest, nausea or dizziness. Most serious systemic reactions develop within 30 minutes of allergy shots. This is why it is strongly  recommended you wait in your doctor's office for 30 minutes after your injections. Your allergist is trained to watch for reactions, and his or her staff is trained and equipped with the proper medications to identify and treat them.  Who Should Administer Allergy Shots?  The preferred location for receiving shots is your prescribing allergist's office. Injections can sometimes be given at another facility where the physician and staff are trained to recognize and treat reactions, and have received instructions by your prescribing allergist.

## 2018-01-04 NOTE — Progress Notes (Signed)
NEW PATIENT  Date of Service/Encounter:  01/04/18  Referring provider: Burnis Medin, MD   Assessment:   Seasonal and perennial allergic rhinitis (trees, weeds, grasses, indoor molds, outdoor molds, dust mites, cat and dog)  Reactive airways dysfunction syndrome - although she clearly lacks the lung findings of this diagnosis  Plan/Recommendations:   1. Chronic rhinitis - Testing today showed: trees, weeds, grasses, indoor molds, outdoor molds, dust mites, cat and dog - Avoidance measures provided. - Continue with: Singulair (montelukast) 10mg  once daily, Flonase (fluticasone) one spray per nostril daily - Start taking: Flonase (fluticasone) one spray per nostril daily, Patanase (olopatadine) two sprays per nostril 1-2 times daily as needed and Pataday (olopatadine) one drop per eye twice daily as needed - You can use an extra dose of the antihistamine, if needed, for breakthrough symptoms.  - Consider nasal saline rinses 1-2 times daily to remove allergens from the nasal cavities as well as help with mucous clearance (this is especially helpful to do before the nasal sprays are given) - Consider allergy shots as a means of long-term control. - Allergy shots "re-train" and "reset" the immune system to ignore environmental allergens and decrease the resulting immune response to those allergens (sneezing, itchy watery eyes, runny nose, nasal congestion, etc).    - Allergy shots improve symptoms in 75-85% of patients.  -Call your insurance company to check on any co-payments.   2. Sensitivity to scents/perfumes - Avoid as much as you can. - Unfortunately, there is nothing that we can do to diagnose or treat this. - You could try using masks while at work, although I am not sure how successful this would be.  - Information on RADS provided, although Ms. Slingerland clear lacks the wheezing/coughing component of this diagnosis. - We could add on inhaler in the future if her symptom profile  changes.   3. Return in about 3 months (around 04/06/2018).   Subjective:   ALLISSON SCHINDEL is a 62 y.o. female presenting today for evaluation of  Chief Complaint  Patient presents with  . Allergic Rhinitis     HOLLEY KOCUREK has a history of the following: Patient Active Problem List   Diagnosis Date Noted  . Reactive airways dysfunction syndrome ([place]) 01/04/2018  . Seasonal and perennial allergic rhinitis 01/04/2018  . Migraine without aura and without status migrainosus, not intractable 02/23/2014  . Shoulder pain, left 02/23/2014  . Obesity (BMI 30-39.9) 02/23/2014  . Visit for preventive health examination 02/06/2013  . Epicondylitis, lateral (tennis elbow) 02/06/2013  . Medication management 02/06/2013  . Routine gynecological examination 01/31/2012  . Routine general medical examination at a health care facility 01/31/2012  . OBESITY 02/01/2010  . SNORING 02/01/2010  . LIVER FUNCTION TESTS, ABNORMAL 02/01/2010  . OTHER CHRONIC DISEASE OF TONSILS AND ADENOIDS 07/13/2009  . FOOT PAIN, RIGHT 07/13/2009  . ADJUSTMENT DISORDER WITH ANXIOUS MOOD 11/19/2007  . OSTEOARTHRITIS 11/19/2007  . VITAMIN D DEFICIENCY 07/31/2007  . ARTHROPATHY NEC, MULTIPLE SITES 05/22/2007  . Hyperlipidemia 03/06/2007  . ALLERGIC RHINITIS 03/06/2007  . HEADACHE 03/06/2007    History obtained from: chart review and patient .  Judie Bonus was referred by Burnis Medin, MD.     Jorryn is a 62 y.o. female presenting for an evaluation of allergic reactions, specifically to perfume, lotions, smoke, and pollens. She has these symptoms for a number of years, but they have gotten worse over the last couple of years. She tells me today that she has  immediate headaches as well as sore throat with rhinitis when she is exposed to smoke, perfumes, and air fresheners. Removing herself from these situations seems to help. She never needs to treat herself with anything in particular, just time seems to cure it.    She also endorses problems with exposure to pollens as well as grass. She reports eye watering, rhinitis, and sneezing. Spring is typically the worse for her. She has tried Human resources officer, Claritin, Flonase, as well as ephedrine as needed. She does have some sneezing occasionally. She has never been tested in the past. She does work in the oldest building on campus with terrible ventilation, which she thinks might be contributing to her symptoms. She does have seven cats at home and sleeps with them nightly, so these might be contributing to her symptoms as well. However, she is rather hopeful that this has nothing to do with it.   She denies food allergies and tolerates all of the major food allergens without adverse events.  She has no problems with wheezing or shortness of breath. She has never needed an inhaler at all. Otherwise, there is no history of other atopic diseases, including drug allergies, stinging insect allergies, or urticaria. There is no significant infectious history. Vaccinations are up to date.    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 She previously worked at both [employer] and [employer].      Past Medical History: Patient Active Problem List   Diagnosis Date Noted  . Reactive airways dysfunction syndrome ([place]) 01/04/2018  . Seasonal and perennial allergic rhinitis 01/04/2018  . Migraine without aura and without status migrainosus, not intractable 02/23/2014  . Shoulder pain, left 02/23/2014  . Obesity (BMI 30-39.9) 02/23/2014  . Visit for preventive health examination 02/06/2013  . Epicondylitis, lateral (tennis elbow) 02/06/2013  . Medication management 02/06/2013  . Routine gynecological examination 01/31/2012  . Routine general medical examination at a health care facility 01/31/2012  . OBESITY 02/01/2010  . SNORING 02/01/2010  . LIVER FUNCTION TESTS, ABNORMAL 02/01/2010  . OTHER CHRONIC DISEASE OF  TONSILS AND ADENOIDS 07/13/2009  . FOOT PAIN, RIGHT 07/13/2009  . ADJUSTMENT DISORDER WITH ANXIOUS MOOD 11/19/2007  . OSTEOARTHRITIS 11/19/2007  . VITAMIN D DEFICIENCY 07/31/2007  . ARTHROPATHY NEC, MULTIPLE SITES 05/22/2007  . Hyperlipidemia 03/06/2007  . ALLERGIC RHINITIS 03/06/2007  . HEADACHE 03/06/2007    Medication List:  Allergies as of 01/04/2018      Reactions   Cetirizine Hcl    REACTION: flu symptoms   Pravastatin Sodium    REACTION: Diarrhea   Propoxyphene N-acetaminophen    REACTION: extreme nausea   Rhinocort [budesonide]    Nose sores   Zetia [ezetimibe]    Body aches, and "sick feeling"      Medication List        Accurate as of 01/04/18 10:58 PM. Always use your most recent med list.          atorvastatin 80 MG tablet Commonly known as:  LIPITOR TAKE 1 TABLET EVERY DAY   eletriptan 40 MG tablet Commonly known as:  RELPAX TAKE 1 TABLET BY MOUTH AS NEEDED FOR MIGRAINE OR HEADACHE. MAY REPEAT IN 2 HOURS IF NEEDED   escitalopram 10 MG tablet Commonly known as:  LEXAPRO Take 1 tablet (10 mg total) by mouth daily.   estradiol 0.1 MG/GM vaginal cream Commonly known as:  ESTRACE VAGINAL Place 1 Applicatorful vaginally at bedtime. For 2 weeks and then  3 x per week or prn   fexofenadine 180 MG tablet Commonly known as:  ALLEGRA Take 180 mg by mouth daily.   FLONASE ALLERGY RELIEF NA Place into the nose.   fluticasone 50 MCG/ACT nasal spray Commonly known as:  FLONASE Place 1 spray into both nostrils daily.   meloxicam 15 MG tablet Commonly known as:  MOBIC TAKE 1 TABLET BY MOUTH DAILY.   montelukast 10 MG tablet Commonly known as:  SINGULAIR Take 1 tablet (10 mg total) by mouth at bedtime.   montelukast 10 MG tablet Commonly known as:  SINGULAIR Take 1 tablet (10 mg total) by mouth at bedtime.   Olopatadine HCl 0.2 % Soln Apply 2 drops to eye daily as needed.   Olopatadine HCl 0.6 % Soln Place 1 spray into the nose 2 (two) times  daily.       Birth History: non-contributory.   Developmental History: non-contributory.   Past Surgical History: Past Surgical History:  Procedure Laterality Date  . BREAST SURGERY  1995   bx  . dental implant Left 11/2016     Family History: Family History  Problem Relation Age of Onset  . Arthritis Mother   . COPD Mother   . Dementia Mother   . Endometrial cancer Mother   . Skin cancer Mother   . Osteoporosis Mother   . Other Mother        dementia  . Heart attack Father   . Hypertension Father        cabd Mi 24 died CHF  . Heart failure Father   . Heart disease Father   . Sleep apnea Sister        improved with weight loss  . Arthritis Sister   . Allergic rhinitis Sister   . Coronary artery disease Sister   . Vasculitis Sister   . Allergic rhinitis Sister   . Angioedema Neg Hx   . Asthma Neg Hx   . Eczema Neg Hx   . Immunodeficiency Neg Hx   . Urticaria Neg Hx      Social History: Taelyr lives at home with her seven cats. She lives in a house that is 90 years old. There are hardwoods throughout the home with some carpeting and laminate as well.  They hav egas heating and central cooling. She will occasionally host dogs in a hospice manner. She has no dust mite coverings on her bedding. There is no tobacco exposure at all.     Review of Systems: a 14-point review of systems is pertinent for what is mentioned in HPI.  Otherwise, all other systems were negative. Constitutional: negative other than that listed in the HPI Eyes: negative other than that listed in the HPI Ears, nose, mouth, throat, and face: negative other than that listed in the HPI Respiratory: negative other than that listed in the HPI Cardiovascular: negative other than that listed in the HPI Gastrointestinal: negative other than that listed in the HPI Genitourinary: negative other than that listed in the HPI Integument: negative other than that listed in the HPI Hematologic: negative  other than that listed in the HPI Musculoskeletal: negative other than that listed in the HPI Neurological: negative other than that listed in the HPI Allergy/Immunologic: negative other than that listed in the HPI    Objective:   Blood pressure 118/74, pulse 69, temperature 98 F (36.7 C), temperature source Oral, resp. rate 16, height 5\' 3"  (1.6 m), weight 228 lb (103.4 kg), SpO2 97 %. Body mass  index is 40.39 kg/m.   Physical Exam:  General: Alert, interactive, in no acute distress. Pleasant and talkative female.  Eyes: No conjunctival injection bilaterally, no discharge on the right, no discharge on the left, no Horner-Trantas dots present and allergic shiners present bilaterally. PERRL bilaterally. EOMI without pain. No photophobia.  Ears: Right TM pearly gray with normal light reflex, Left TM pearly gray with normal light reflex, Right TM intact without perforation and Left TM intact without perforation.  Nose/Throat: External nose within normal limits, nasal crease present and septum midline. Turbinates edematous and pale with clear discharge. Posterior oropharynx moderately erythematous without cobblestoning in the posterior oropharynx. Tonsils 2+ without exudates.  Tongue without thrush. Neck: Supple without thyromegaly. Trachea midline. Adenopathy: no enlarged lymph nodes appreciated in the anterior cervical, occipital, axillary, epitrochlear, inguinal, or popliteal regions. Lungs: Clear to auscultation without wheezing, rhonchi or rales. No increased work of breathing. CV: Normal S1/S2. No murmurs. Capillary refill <2 seconds.  Abdomen: Nondistended, nontender. No guarding or rebound tenderness. Bowel sounds present in all fields and hypoactive  Skin: Warm and dry, without lesions or rashes. Extremities:  No clubbing, cyanosis or edema. Neuro:   Grossly intact. No focal deficits appreciated. Responsive to questions.  Diagnostic studies:    Allergy Studies:    Indoor/Outdoor Percutaneous Adult Environmental Panel: positive to cat. Otherwise negative with adequate controls.  Indoor/Outdoor Selected Intradermal Environmental Panel: positive to [place] grass, [place] grass, Grass mix, weed mix, tree mix, mold mix #1, mold mix #2, mold mix #3, mold mix #4, dog and mite mix. Otherwise negative with adequate controls.   Allergy testing results were read and interpreted by myself, documented by clinical staff.       Salvatore Marvel, MD [facility]

## 2018-01-07 ENCOUNTER — Encounter: Payer: Self-pay | Admitting: Allergy & Immunology

## 2018-01-08 ENCOUNTER — Ambulatory Visit (INDEPENDENT_AMBULATORY_CARE_PROVIDER_SITE_OTHER): Payer: BC Managed Care – PPO | Admitting: Family Medicine

## 2018-01-08 DIAGNOSIS — Z23 Encounter for immunization: Secondary | ICD-10-CM | POA: Diagnosis not present

## 2018-03-15 NOTE — Progress Notes (Signed)
Chief Complaint  Patient presents with  . Annual Exam    Pt c/o worsening arthritis/stiffness and fatigue.    HPI: Patient  Audrey Jenkins  62 y.o. comes in today for Preventive Health Care visit  Chronic disease management and other  Sx    Migraine  This summer .  notas much energy.   Over the last year  No events  Down and blue over not being able to lose weight  ( likes to eat) but  Joints problematic and now fatigue   Takes nap in afternoon   Head down  At work    In [place] in  Morning  And joints are hands  In am . Hand   Stiff and achy  And also large  joints   Thinks OA  Family hx of same   Allergies and migraines triggers   Declines mammo  And pap   Taking lexapro  And had hot flushes when tried to stop.    Health Maintenance  Topic Date Due  . HIV Screening  09/11/1970  . INFLUENZA VACCINE  03/28/2018  . COLONOSCOPY  07/26/2020  . TETANUS/TDAP  01/24/2021  . Hepatitis C Screening  Completed  . PAP SMEAR  Discontinued   Health Maintenance Review LIFESTYLE:  Exercise:   Ha stiff an sore with arthritis . rd to bend over. Walks some.  Tobacco/ETS:  no Alcohol:  no Sugar beverages:  Tries stevia.  Sleep: Drug use: no HH of  2   Pets   Allergic  Per  Allergist..    Work:  At [employer] in summer   ROS: allergies problematic  GEN/ HEENT: No fever, significant weight changes sweats headaches vision problems hearing changes, CV/ PULM; No chest pain shortness of breath cough, syncope,edema  change in exercise tolerance. GI /GU: No adominal pain, vomiting, change in bowel habits. No blood in the stool. No significant GU symptoms. SKIN/HEME: ,no acute skin rashes suspicious lesions or bleeding. No lymphadenopathy, nodules, masses.  NEURO/ PSYCH:  No neurologic signs such as weakness numbness. No depression anxiety. IMM/ Allergy: No unusual infections.  Allergy .   REST of 12 system review negative except as per HPI   Past Medical History:  Diagnosis Date  .  Abnormal LFTs   . Allergy   . Depression   . Fatty liver    on [place] Jan 2011  . Headache(784.0)   . Hyperlipidemia    Inital 400's  . Mood disorder ([place])   . Osteoarthritis   . Primiparous   . Snoring    ENT [facility] Jan 2011 nasal obstruction and cryptic tonsils    Past Surgical History:  Procedure Laterality Date  . BREAST SURGERY  1995   bx  . dental implant Left 11/2016    Family History  Problem Relation Age of Onset  . Arthritis Mother   . COPD Mother   . Dementia Mother   . Endometrial cancer Mother   . Skin cancer Mother   . Osteoporosis Mother   . Other Mother        dementia  . Heart attack Father   . Hypertension Father        cabd Mi 94 died CHF  . Heart failure Father   . Heart disease Father   . Sleep apnea Sister        improved with weight loss  . Arthritis Sister   . Allergic rhinitis Sister   . Coronary artery disease Sister   .  Vasculitis Sister   . Allergic rhinitis Sister   . Angioedema Neg Hx   . Asthma Neg Hx   . Eczema Neg Hx   . Immunodeficiency Neg Hx   . Urticaria Neg Hx     Social History   Socioeconomic History  . Marital status: Married    Spouse name: Not on file  . Number of children: Not on file  . Years of education: Not on file  . Highest education level: Not on file  Occupational History  . Not on file  Social Needs  . Financial resource strain: Not on file  . Food insecurity:    Worry: Not on file    Inability: Not on file  . Transportation needs:    Medical: Not on file    Non-medical: Not on file  Tobacco Use  . Smoking status: Never Smoker  . Smokeless tobacco: Never Used  Substance and Sexual Activity  . Alcohol use: Yes    Alcohol/week: 0.0 oz    Comment: socially  . Drug use: No  . Sexual activity: Not on file  Lifestyle  . Physical activity:    Days per week: Not on file    Minutes per session: Not on file  . Stress: Not on file  Relationships  . Social connections:    Talks on phone: Not on  file    Gets together: Not on file    Attends religious service: Not on file    Active member of club or organization: Not on file    Attends meetings of clubs or organizations: Not on file    Relationship status: Not on file  Other Topics Concern  . 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 acetaminophen (TYLENOL) 500 MG tablet Take 500-1,000 mg by mouth every 6 (six) hours as needed.    Marland Kitchen atorvastatin (LIPITOR) 80 MG tablet TAKE 1 TABLET EVERY DAY 90 tablet 2  . eletriptan (RELPAX) 40 MG tablet TAKE 1 TABLET BY MOUTH AS NEEDED FOR MIGRAINE OR HEADACHE. MAY REPEAT IN 2 HOURS IF NEEDED 9 tablet 1  . escitalopram (LEXAPRO) 10 MG tablet Take 1 tablet (10 mg total) by mouth daily. 90 tablet 1  . estradiol (ESTRACE VAGINAL) 0.1 MG/GM vaginal cream Place 1 Applicatorful vaginally at bedtime. For 2 weeks and then 3 x per week or prn 42.5 g 12  . fexofenadine (ALLEGRA) 180 MG tablet Take 180 mg by mouth daily.    . fluticasone (FLONASE) 50 MCG/ACT nasal spray Place 1 spray into both nostrils daily. 18.2 g 5  . Fluticasone Propionate (FLONASE ALLERGY RELIEF NA) Place into the nose.    . ibuprofen (ADVIL,MOTRIN) 200 MG tablet Take 400 mg by mouth every 6 (six) hours as needed.    . montelukast (SINGULAIR) 10 MG tablet Take 1 tablet (10 mg total) by mouth at bedtime. 30 tablet 5  . Olopatadine HCl 0.2 % SOLN Apply 2 drops to eye daily as needed. 2.5 mL 5  . Olopatadine HCl 0.6 % SOLN Place 1 spray into the nose 2 (two) times daily. 30.5 Bottle 5  .  meloxicam (MOBIC) 15 MG tablet TAKE 1 TABLET BY MOUTH DAILY. (Patient not taking: Reported on 03/18/2018) 30 tablet 3  . montelukast (SINGULAIR) 10 MG tablet Take 1 tablet (10 mg total) by  mouth at bedtime. (Patient not taking: Reported on 03/18/2018) 30 tablet 3   No facility-administered medications prior to visit.      EXAM:  BP 112/72 (BP Location: Right Arm, Patient Position: Sitting, Cuff Size: Normal)   Pulse (!) 58   Temp 98.3 F (36.8 C) (Oral)   Ht 5' 4"  (1.626 m)   Wt 227 lb 6.4 oz (103.1 kg)   BMI 39.03 kg/m   Body mass index is 39.03 kg/m. Wt Readings from Last 3 Encounters:  03/18/18 227 lb 6.4 oz (103.1 kg)  01/04/18 228 lb (103.4 kg)  12/03/17 228 lb (103.4 kg)    Physical Exam: Vital signs reviewed [id number] is a well-developed well-nourished alert cooperative    who appearsr stated age in no acute distress.  HEENT: normocephalic atraumatic , Eyes: PERRL EOM's full, conjunctiva clear, Nares: paten,t no deformity discharge or tenderness., Ears: no deformity EAC's clear TMs with normal landmarks. Mouth: clear OP, no lesions, edema.  Moist mucous membranes. Dentition in adequate repair. NECK: supple without masses, thyromegaly or bruits. CHEST/PULM:  Clear to auscultation and percussion breath sounds equal no wheeze , rales or rhonchi. No chest wall deformities or tenderness. Breast: normal by inspection . No dimpling, discharge, masses, tenderness or discharge . CV: PMI is nondisplaced, S1 S2 no gallops, murmurs, rubs. Peripheral pulses are full without delay.No JVD .  ABDOMEN: Bowel sounds normal nontender  No guard or rebound, no hepato splenomegal no CVA tenderness.  No hernia. Extremtities:  No clubbing cyanosis or edema, no acute joint swelling or redness no focal atrophy NEURO:  Oriented x3, cranial nerves 3-12 appear to be intact, no obvious focal weakness,gait within normal limits no abnormal reflexes or asymmetrical SKIN: No acute rashes normal turgor, color, no bruising or petechiae. PSYCH: Oriented, good eye contact, no obvious depression anxiety, cognition and judgment appear normal. LN: no cervical axillary inguinal adenopathy  Lab  Results  Component Value Date   WBC 7.3 03/18/2018   HGB 12.4 03/18/2018   HCT 37.6 03/18/2018   PLT 306.0 03/18/2018   GLUCOSE 97 03/18/2018   CHOL 224 (H) 03/18/2018   TRIG 138.0 03/18/2018   HDL 61.10 03/18/2018   LDLDIRECT 157.4 02/19/2014   LDLCALC 136 (H) 03/18/2018   ALT 19 03/18/2018   AST 15 03/18/2018   NA 141 03/18/2018   K 4.4 03/18/2018   CL 105 03/18/2018   CREATININE 0.76 03/18/2018   BUN 11 03/18/2018   CO2 28 03/18/2018   TSH 2.74 03/18/2018    BP Readings from Last 3 Encounters:  03/18/18 112/72  01/04/18 118/74  12/03/17 118/76     ASSESSMENT AND PLAN:  Discussed the following assessment and plan:  Visit for preventive health examination - Plan: C-reactive protein, Sedimentation rate, ANA, Cyclic citrul peptide antibody, IgG, Basic metabolic panel, CBC with Differential/Platelet, Hepatic function panel, Lipid panel, TSH  Medication management - Plan: C-reactive protein, Sedimentation rate, ANA, Cyclic citrul peptide antibody, IgG, Basic metabolic panel, CBC with Differential/Platelet, Hepatic function panel, Lipid panel, TSH  Hyperlipidemia, unspecified hyperlipidemia type - Plan: C-reactive protein, Sedimentation rate, ANA, Cyclic citrul peptide antibody, IgG, Basic metabolic panel, CBC with Differential/Platelet, Hepatic function panel, Lipid panel, TSH  Other fatigue - Plan: C-reactive protein, Sedimentation rate, ANA, Cyclic citrul peptide antibody, IgG, Basic metabolic panel,  CBC with Differential/Platelet, Hepatic function panel, Lipid panel, TSH, Ambulatory referral to Pulmonology  Snoring - Plan: C-reactive protein, Sedimentation rate, ANA, Cyclic citrul peptide antibody, IgG, Basic metabolic panel, CBC with Differential/Platelet, Hepatic function panel, Lipid panel, TSH, Ambulatory referral to Pulmonology  Joint stiffness - Plan: C-reactive protein, Sedimentation rate, ANA, Cyclic citrul peptide antibody, IgG, Basic metabolic panel, CBC with  Differential/Platelet, Hepatic function panel, Lipid panel, TSH  Mammogram declined  Pap smear of cervix declined rov in about 4 months and fu of all issues  Counseled. about attacking snack eating  Adding to problems  Consider inflammatory arthritis in addition to osteoarthritis considering the amount of pain in morning hand stiffness she has.  Check serologies today consider rheumatology consult She certainly could have sleep apnea adding to problems based on history and context.  Refer for evaluation. In the meantime lab monitoring same medications follow-up in about 4 months. She has declined mammogram and Pap smear at this time. Patient Care Team: [place], Standley Brooking, MD as PCP - General Rozetta Nunnery, MD Patient Instructions    Plan refer to sleep specialist to evaluate for poss sleep apnea  Cause.    Lab today   consider  Rheumatology  evaluation to see if  More than one type of  Arthitis.  Beside osteoparhtritis   Avoid simple carbs  .  dont eat buy snacks you love..  consider water exercise  .  Plan fu depending on labs  And how doing    Health Maintenance, Female Adopting a healthy lifestyle and getting preventive care can go a long way to promote health and wellness. Talk with your health care provider about what schedule of regular examinations is right for you. This is a good chance for you to check in with your provider about disease prevention and staying healthy. In between checkups, there are plenty of things you can do on your own. Experts have done a lot of research about which lifestyle changes and preventive measures are most likely to keep you healthy. Ask your health care provider for more information. Weight and diet Eat a healthy diet  Be sure to include plenty of vegetables, fruits, low-fat dairy products, and lean protein.  Do not eat a lot of foods high in solid fats, added sugars, or salt.  Get regular exercise. This is one of the most important  things you can do for your health. ? Most adults should exercise for at least 150 minutes each week. The exercise should increase your heart rate and make you sweat (moderate-intensity exercise). ? Most adults should also do strengthening exercises at least twice a week. This is in addition to the moderate-intensity exercise.  Maintain a healthy weight  Body mass index (BMI) is a measurement that can be used to identify possible weight problems. It estimates body fat based on height and weight. Your health care provider can help determine your BMI and help you achieve or maintain a healthy weight.  For females 61 years of age and older: ? A BMI below 18.5 is considered underweight. ? A BMI of 18.5 to 24.9 is normal. ? A BMI of 25 to 29.9 is considered overweight. ? A BMI of 30 and above is considered obese.  Watch levels of cholesterol and blood lipids  You should start having your blood tested for lipids and cholesterol at 60 years of age, then have this test every 5 years.  You may need to have your cholesterol levels checked more often if: ?  Your lipid or cholesterol levels are high. ? You are older than 60 years of age. ? You are at high risk for heart disease.  Cancer screening Lung Cancer  Lung cancer screening is recommended for adults 54-54 years old who are at high risk for lung cancer because of a history of smoking.  A yearly low-dose CT scan of the lungs is recommended for people who: ? Currently smoke. ? Have quit within the past 15 years. ? Have at least a 30-pack-year history of smoking. A pack year is smoking an average of one pack of cigarettes a day for 1 year.  Yearly screening should continue until it has been 15 years since you quit.  Yearly screening should stop if you develop a health problem that would prevent you from having lung cancer treatment.  Breast Cancer  Practice breast self-awareness. This means understanding how your breasts normally appear  and feel.  It also means doing regular breast self-exams. Let your health care provider know about any changes, no matter how small.  If you are in your 20s or 30s, you should have a clinical breast exam (CBE) by a health care provider every 1-3 years as part of a regular health exam.  If you are 70 or older, have a CBE every year. Also consider having a breast X-ray (mammogram) every year.  If you have a family history of breast cancer, talk to your health care provider about genetic screening.  If you are at high risk for breast cancer, talk to your health care provider about having an MRI and a mammogram every year.  Breast cancer gene (BRCA) assessment is recommended for women who have family members with BRCA-related cancers. BRCA-related cancers include: ? Breast. ? Ovarian. ? Tubal. ? Peritoneal cancers.  Results of the assessment will determine the need for genetic counseling and BRCA1 and BRCA2 testing.  Cervical Cancer Your health care provider may recommend that you be screened regularly for cancer of the pelvic organs (ovaries, uterus, and vagina). This screening involves a pelvic examination, including checking for microscopic changes to the surface of your cervix (Pap test). You may be encouraged to have this screening done every 3 years, beginning at age 81.  For women ages 28-65, health care providers may recommend pelvic exams and Pap testing every 3 years, or they may recommend the Pap and pelvic exam, combined with testing for human papilloma virus (HPV), every 5 years. Some types of HPV increase your risk of cervical cancer. Testing for HPV may also be done on women of any age with unclear Pap test results.  Other health care providers may not recommend any screening for nonpregnant women who are considered low risk for pelvic cancer and who do not have symptoms. Ask your health care provider if a screening pelvic exam is right for you.  If you have had past treatment  for cervical cancer or a condition that could lead to cancer, you need Pap tests and screening for cancer for at least 20 years after your treatment. If Pap tests have been discontinued, your risk factors (such as having a new sexual partner) need to be reassessed to determine if screening should resume. Some women have medical problems that increase the chance of getting cervical cancer. In these cases, your health care provider may recommend more frequent screening and Pap tests.  Colorectal Cancer  This type of cancer can be detected and often prevented.  Routine colorectal cancer screening usually begins at 60 years  of age and continues through 60 years of age.  Your health care provider may recommend screening at an earlier age if you have risk factors for colon cancer.  Your health care provider may also recommend using home test kits to check for hidden blood in the stool.  A small camera at the end of a tube can be used to examine your colon directly (sigmoidoscopy or colonoscopy). This is done to check for the earliest forms of colorectal cancer.  Routine screening usually begins at age 74.  Direct examination of the colon should be repeated every 5-10 years through 60 years of age. However, you may need to be screened more often if early forms of precancerous polyps or small growths are found.  Skin Cancer  Check your skin from head to toe regularly.  Tell your health care provider about any new moles or changes in moles, especially if there is a change in a mole's shape or color.  Also tell your health care provider if you have a mole that is larger than the size of a pencil eraser.  Always use sunscreen. Apply sunscreen liberally and repeatedly throughout the day.  Protect yourself by wearing long sleeves, pants, a wide-brimmed hat, and sunglasses whenever you are outside.  Heart disease, diabetes, and high blood pressure  High blood pressure causes heart disease and  increases the risk of stroke. High blood pressure is more likely to develop in: ? People who have blood pressure in the high end of the normal range (130-139/85-89 mm Hg). ? People who are overweight or obese. ? People who are African American.  If you are 15-46 years of age, have your blood pressure checked every 3-5 years. If you are 72 years of age or older, have your blood pressure checked every year. You should have your blood pressure measured twice-once when you are at a hospital or clinic, and once when you are not at a hospital or clinic. Record the average of the two measurements. To check your blood pressure when you are not at a hospital or clinic, you can use: ? An automated blood pressure machine at a pharmacy. ? A home blood pressure monitor.  If you are between 60 years and 65 years old, ask your health care provider if you should take aspirin to prevent strokes.  Have regular diabetes screenings. This involves taking a blood sample to check your fasting blood sugar level. ? If you are at a normal weight and have a low risk for diabetes, have this test once every three years after 60 years of age. ? If you are overweight and have a high risk for diabetes, consider being tested at a younger age or more often. Preventing infection Hepatitis B  If you have a higher risk for hepatitis B, you should be screened for this virus. You are considered at high risk for hepatitis B if: ? You were born in a country where hepatitis B is common. Ask your health care provider which countries are considered high risk. ? Your parents were born in a high-risk country, and you have not been immunized against hepatitis B (hepatitis B vaccine). ? You have HIV or AIDS. ? You use needles to inject street drugs. ? You live with someone who has hepatitis B. ? You have had sex with someone who has hepatitis B. ? You get hemodialysis treatment. ? You take certain medicines for conditions, including  cancer, organ transplantation, and autoimmune conditions.  Hepatitis C  Blood  testing is recommended for: ? Everyone born from [birth year] through 1963. ? Anyone with known risk factors for hepatitis C.  Sexually transmitted infections (STIs)  You should be screened for sexually transmitted infections (STIs) including gonorrhea and chlamydia if: ? You are sexually active and are younger than 60 years of age. ? You are older than 60 years of age and your health care provider tells you that you are at risk for this type of infection. ? Your sexual activity has changed since you were last screened and you are at an increased risk for chlamydia or gonorrhea. Ask your health care provider if you are at risk.  If you do not have HIV, but are at risk, it may be recommended that you take a prescription medicine daily to prevent HIV infection. This is called pre-exposure prophylaxis (PrEP). You are considered at risk if: ? You are sexually active and do not regularly use condoms or know the HIV status of your partner(s). ? You take drugs by injection. ? You are sexually active with a partner who has HIV.  Talk with your health care provider about whether you are at high risk of being infected with HIV. If you choose to begin PrEP, you should first be tested for HIV. You should then be tested every 3 months for as long as you are taking PrEP. Pregnancy  If you are premenopausal and you may become pregnant, ask your health care provider about preconception counseling.  If you may become pregnant, take 400 to 800 micrograms (mcg) of folic acid every day.  If you want to prevent pregnancy, talk to your health care provider about birth control (contraception). Osteoporosis and menopause  Osteoporosis is a disease in which the bones lose minerals and strength with aging. This can result in serious bone fractures. Your risk for osteoporosis can be identified using a bone density scan.  If you are 57 years  of age or older, or if you are at risk for osteoporosis and fractures, ask your health care provider if you should be screened.  Ask your health care provider whether you should take a calcium or vitamin D supplement to lower your risk for osteoporosis.  Menopause may have certain physical symptoms and risks.  Hormone replacement therapy may reduce some of these symptoms and risks. Talk to your health care provider about whether hormone replacement therapy is right for you. Follow these instructions at home:  Schedule regular health, dental, and eye exams.  Stay current with your immunizations.  Do not use any tobacco products including cigarettes, chewing tobacco, or electronic cigarettes.  If you are pregnant, do not drink alcohol.  If you are breastfeeding, limit how much and how often you drink alcohol.  Limit alcohol intake to no more than 1 drink per day for nonpregnant women. One drink equals 12 ounces of beer, 5 ounces of wine, or 1 ounces of hard liquor.  Do not use street drugs.  Do not share needles.  Ask your health care provider for help if you need support or information about quitting drugs.  Tell your health care provider if you often feel depressed.  Tell your health care provider if you have ever been abused or do not feel safe at home. This information is not intended to replace advice given to you by your health care provider. Make sure you discuss any questions you have with your health care provider. Document Released: 02/27/2011 Document Revised: 01/20/2016 Document Reviewed: 05/18/2015 Elsevier Interactive Patient Education  2018 [place] Sita Mangen M.D.

## 2018-03-18 ENCOUNTER — Encounter: Payer: Self-pay | Admitting: Internal Medicine

## 2018-03-18 ENCOUNTER — Ambulatory Visit (INDEPENDENT_AMBULATORY_CARE_PROVIDER_SITE_OTHER): Payer: BC Managed Care – PPO | Admitting: Internal Medicine

## 2018-03-18 VITALS — BP 112/72 | HR 58 | Temp 98.3°F | Ht 64.0 in | Wt 227.4 lb

## 2018-03-18 DIAGNOSIS — M2560 Stiffness of unspecified joint, not elsewhere classified: Secondary | ICD-10-CM | POA: Diagnosis not present

## 2018-03-18 DIAGNOSIS — R5383 Other fatigue: Secondary | ICD-10-CM | POA: Diagnosis not present

## 2018-03-18 DIAGNOSIS — Z0000 Encounter for general adult medical examination without abnormal findings: Secondary | ICD-10-CM

## 2018-03-18 DIAGNOSIS — Z5320 Procedure and treatment not carried out because of patient's decision for unspecified reasons: Secondary | ICD-10-CM | POA: Diagnosis not present

## 2018-03-18 DIAGNOSIS — R0683 Snoring: Secondary | ICD-10-CM

## 2018-03-18 DIAGNOSIS — E785 Hyperlipidemia, unspecified: Secondary | ICD-10-CM

## 2018-03-18 DIAGNOSIS — Z79899 Other long term (current) drug therapy: Secondary | ICD-10-CM

## 2018-03-18 LAB — HEPATIC FUNCTION PANEL
ALBUMIN: 4.3 g/dL (ref 3.5–5.2)
ALT: 19 U/L (ref 0–35)
AST: 15 U/L (ref 0–37)
Alkaline Phosphatase: 126 U/L — ABNORMAL HIGH (ref 39–117)
BILIRUBIN DIRECT: 0.1 mg/dL (ref 0.0–0.3)
Total Bilirubin: 0.5 mg/dL (ref 0.2–1.2)
Total Protein: 7.3 g/dL (ref 6.0–8.3)

## 2018-03-18 LAB — CBC WITH DIFFERENTIAL/PLATELET
BASOS ABS: 0.1 10*3/uL (ref 0.0–0.1)
Basophils Relative: 0.8 % (ref 0.0–3.0)
EOS ABS: 0.2 10*3/uL (ref 0.0–0.7)
Eosinophils Relative: 3 % (ref 0.0–5.0)
HCT: 37.6 % (ref 36.0–46.0)
Hemoglobin: 12.4 g/dL (ref 12.0–15.0)
Lymphocytes Relative: 26.6 % (ref 12.0–46.0)
Lymphs Abs: 1.9 10*3/uL (ref 0.7–4.0)
MCHC: 32.8 g/dL (ref 30.0–36.0)
MCV: 85.7 fl (ref 78.0–100.0)
Monocytes Absolute: 0.6 10*3/uL (ref 0.1–1.0)
Monocytes Relative: 7.9 % (ref 3.0–12.0)
Neutro Abs: 4.5 10*3/uL (ref 1.4–7.7)
Neutrophils Relative %: 61.7 % (ref 43.0–77.0)
Platelets: 306 10*3/uL (ref 150.0–400.0)
RBC: 4.39 Mil/uL (ref 3.87–5.11)
RDW: 14.4 % (ref 11.5–15.5)
WBC: 7.3 10*3/uL (ref 4.0–10.5)

## 2018-03-18 LAB — LIPID PANEL
CHOL/HDL RATIO: 4
Cholesterol: 224 mg/dL — ABNORMAL HIGH (ref 0–200)
HDL: 61.1 mg/dL (ref >39.00)
LDL Cholesterol: 136 mg/dL — ABNORMAL HIGH (ref 0–99)
NONHDL: 163.26
Triglycerides: 138 mg/dL (ref 0.0–149.0)
VLDL: 27.6 mg/dL (ref 0.0–40.0)

## 2018-03-18 LAB — BASIC METABOLIC PANEL
BUN: 11 mg/dL (ref 6–23)
CALCIUM: 9.4 mg/dL (ref 8.4–10.5)
CHLORIDE: 105 meq/L (ref 96–112)
CO2: 28 meq/L (ref 19–32)
Creatinine, Ser: 0.76 mg/dL (ref 0.40–1.20)
GFR: 82.36 mL/min (ref >60.00)
Glucose, Bld: 97 mg/dL (ref 70–99)
POTASSIUM: 4.4 meq/L (ref 3.5–5.1)
SODIUM: 141 meq/L (ref 135–145)

## 2018-03-18 LAB — SEDIMENTATION RATE: SED RATE: 54 mm/h — AB (ref 0–30)

## 2018-03-18 LAB — TSH: TSH: 2.74 u[IU]/mL (ref 0.35–4.50)

## 2018-03-18 LAB — C-REACTIVE PROTEIN: CRP: 0.2 mg/dL — AB (ref 0.5–20.0)

## 2018-03-18 NOTE — Patient Instructions (Addendum)
Plan refer to sleep specialist to evaluate for poss sleep apnea  Cause.    Lab today   consider  Rheumatology  evaluation to see if  More than one type of  Arthitis.  Beside osteoparhtritis   Avoid simple carbs  .  dont eat buy snacks you love..  consider water exercise  .  Plan fu depending on labs  And how doing    Health Maintenance, Female Adopting a healthy lifestyle and getting preventive care can go a long way to promote health and wellness. Talk with your health care provider about what schedule of regular examinations is right for you. This is a good chance for you to check in with your provider about disease prevention and staying healthy. In between checkups, there are plenty of things you can do on your own. Experts have done a lot of research about which lifestyle changes and preventive measures are most likely to keep you healthy. Ask your health care provider for more information. Weight and diet Eat a healthy diet  Be sure to include plenty of vegetables, fruits, low-fat dairy products, and lean protein.  Do not eat a lot of foods high in solid fats, added sugars, or salt.  Get regular exercise. This is one of the most important things you can do for your health. ? Most adults should exercise for at least 150 minutes each week. The exercise should increase your heart rate and make you sweat (moderate-intensity exercise). ? Most adults should also do strengthening exercises at least twice a week. This is in addition to the moderate-intensity exercise.  Maintain a healthy weight  Body mass index (BMI) is a measurement that can be used to identify possible weight problems. It estimates body fat based on height and weight. Your health care provider can help determine your BMI and help you achieve or maintain a healthy weight.  For females 25 years of age and older: ? A BMI below 18.5 is considered underweight. ? A BMI of 18.5 to 24.9 is normal. ? A BMI of 25 to 29.9 is  considered overweight. ? A BMI of 30 and above is considered obese.  Watch levels of cholesterol and blood lipids  You should start having your blood tested for lipids and cholesterol at 60 years of age, then have this test every 5 years.  You may need to have your cholesterol levels checked more often if: ? Your lipid or cholesterol levels are high. ? You are older than 60 years of age. ? You are at high risk for heart disease.  Cancer screening Lung Cancer  Lung cancer screening is recommended for adults 41-6 years old who are at high risk for lung cancer because of a history of smoking.  A yearly low-dose CT scan of the lungs is recommended for people who: ? Currently smoke. ? Have quit within the past 15 years. ? Have at least a 30-pack-year history of smoking. A pack year is smoking an average of one pack of cigarettes a day for 1 year.  Yearly screening should continue until it has been 15 years since you quit.  Yearly screening should stop if you develop a health problem that would prevent you from having lung cancer treatment.  Breast Cancer  Practice breast self-awareness. This means understanding how your breasts normally appear and feel.  It also means doing regular breast self-exams. Let your health care provider know about any changes, no matter how small.  If you are in your  47s or 69s, you should have a clinical breast exam (CBE) by a health care provider every 1-3 years as part of a regular health exam.  If you are 71 or older, have a CBE every year. Also consider having a breast X-ray (mammogram) every year.  If you have a family history of breast cancer, talk to your health care provider about genetic screening.  If you are at high risk for breast cancer, talk to your health care provider about having an MRI and a mammogram every year.  Breast cancer gene (BRCA) assessment is recommended for women who have family members with BRCA-related cancers.  BRCA-related cancers include: ? Breast. ? Ovarian. ? Tubal. ? Peritoneal cancers.  Results of the assessment will determine the need for genetic counseling and BRCA1 and BRCA2 testing.  Cervical Cancer Your health care provider may recommend that you be screened regularly for cancer of the pelvic organs (ovaries, uterus, and vagina). This screening involves a pelvic examination, including checking for microscopic changes to the surface of your cervix (Pap test). You may be encouraged to have this screening done every 3 years, beginning at age 59.  For women ages 49-65, health care providers may recommend pelvic exams and Pap testing every 3 years, or they may recommend the Pap and pelvic exam, combined with testing for human papilloma virus (HPV), every 5 years. Some types of HPV increase your risk of cervical cancer. Testing for HPV may also be done on women of any age with unclear Pap test results.  Other health care providers may not recommend any screening for nonpregnant women who are considered low risk for pelvic cancer and who do not have symptoms. Ask your health care provider if a screening pelvic exam is right for you.  If you have had past treatment for cervical cancer or a condition that could lead to cancer, you need Pap tests and screening for cancer for at least 20 years after your treatment. If Pap tests have been discontinued, your risk factors (such as having a new sexual partner) need to be reassessed to determine if screening should resume. Some women have medical problems that increase the chance of getting cervical cancer. In these cases, your health care provider may recommend more frequent screening and Pap tests.  Colorectal Cancer  This type of cancer can be detected and often prevented.  Routine colorectal cancer screening usually begins at 60 years of age and continues through 60 years of age.  Your health care provider may recommend screening at an earlier age if  you have risk factors for colon cancer.  Your health care provider may also recommend using home test kits to check for hidden blood in the stool.  A small camera at the end of a tube can be used to examine your colon directly (sigmoidoscopy or colonoscopy). This is done to check for the earliest forms of colorectal cancer.  Routine screening usually begins at age 40.  Direct examination of the colon should be repeated every 5-10 years through 60 years of age. However, you may need to be screened more often if early forms of precancerous polyps or small growths are found.  Skin Cancer  Check your skin from head to toe regularly.  Tell your health care provider about any new moles or changes in moles, especially if there is a change in a mole's shape or color.  Also tell your health care provider if you have a mole that is larger than the size of  a pencil eraser.  Always use sunscreen. Apply sunscreen liberally and repeatedly throughout the day.  Protect yourself by wearing long sleeves, pants, a wide-brimmed hat, and sunglasses whenever you are outside.  Heart disease, diabetes, and high blood pressure  High blood pressure causes heart disease and increases the risk of stroke. High blood pressure is more likely to develop in: ? People who have blood pressure in the high end of the normal range (130-139/85-89 mm Hg). ? People who are overweight or obese. ? People who are African American.  If you are 91-8 years of age, have your blood pressure checked every 3-5 years. If you are 22 years of age or older, have your blood pressure checked every year. You should have your blood pressure measured twice-once when you are at a hospital or clinic, and once when you are not at a hospital or clinic. Record the average of the two measurements. To check your blood pressure when you are not at a hospital or clinic, you can use: ? An automated blood pressure machine at a pharmacy. ? A home blood  pressure monitor.  If you are between 19 years and 93 years old, ask your health care provider if you should take aspirin to prevent strokes.  Have regular diabetes screenings. This involves taking a blood sample to check your fasting blood sugar level. ? If you are at a normal weight and have a low risk for diabetes, have this test once every three years after 60 years of age. ? If you are overweight and have a high risk for diabetes, consider being tested at a younger age or more often. Preventing infection Hepatitis B  If you have a higher risk for hepatitis B, you should be screened for this virus. You are considered at high risk for hepatitis B if: ? You were born in a country where hepatitis B is common. Ask your health care provider which countries are considered high risk. ? Your parents were born in a high-risk country, and you have not been immunized against hepatitis B (hepatitis B vaccine). ? You have HIV or AIDS. ? You use needles to inject street drugs. ? You live with someone who has hepatitis B. ? You have had sex with someone who has hepatitis B. ? You get hemodialysis treatment. ? You take certain medicines for conditions, including cancer, organ transplantation, and autoimmune conditions.  Hepatitis C  Blood testing is recommended for: ? Everyone born from [birth year] through 1963. ? Anyone with known risk factors for hepatitis C.  Sexually transmitted infections (STIs)  You should be screened for sexually transmitted infections (STIs) including gonorrhea and chlamydia if: ? You are sexually active and are younger than 61 years of age. ? You are older than 60 years of age and your health care provider tells you that you are at risk for this type of infection. ? Your sexual activity has changed since you were last screened and you are at an increased risk for chlamydia or gonorrhea. Ask your health care provider if you are at risk.  If you do not have HIV, but are at risk,  it may be recommended that you take a prescription medicine daily to prevent HIV infection. This is called pre-exposure prophylaxis (PrEP). You are considered at risk if: ? You are sexually active and do not regularly use condoms or know the HIV status of your partner(s). ? You take drugs by injection. ? You are sexually active with a partner who has  HIV.  Talk with your health care provider about whether you are at high risk of being infected with HIV. If you choose to begin PrEP, you should first be tested for HIV. You should then be tested every 3 months for as long as you are taking PrEP. Pregnancy  If you are premenopausal and you may become pregnant, ask your health care provider about preconception counseling.  If you may become pregnant, take 400 to 800 micrograms (mcg) of folic acid every day.  If you want to prevent pregnancy, talk to your health care provider about birth control (contraception). Osteoporosis and menopause  Osteoporosis is a disease in which the bones lose minerals and strength with aging. This can result in serious bone fractures. Your risk for osteoporosis can be identified using a bone density scan.  If you are 28 years of age or older, or if you are at risk for osteoporosis and fractures, ask your health care provider if you should be screened.  Ask your health care provider whether you should take a calcium or vitamin D supplement to lower your risk for osteoporosis.  Menopause may have certain physical symptoms and risks.  Hormone replacement therapy may reduce some of these symptoms and risks. Talk to your health care provider about whether hormone replacement therapy is right for you. Follow these instructions at home:  Schedule regular health, dental, and eye exams.  Stay current with your immunizations.  Do not use any tobacco products including cigarettes, chewing tobacco, or electronic cigarettes.  If you are pregnant, do not drink  alcohol.  If you are breastfeeding, limit how much and how often you drink alcohol.  Limit alcohol intake to no more than 1 drink per day for nonpregnant women. One drink equals 12 ounces of beer, 5 ounces of wine, or 1 ounces of hard liquor.  Do not use street drugs.  Do not share needles.  Ask your health care provider for help if you need support or information about quitting drugs.  Tell your health care provider if you often feel depressed.  Tell your health care provider if you have ever been abused or do not feel safe at home. This information is not intended to replace advice given to you by your health care provider. Make sure you discuss any questions you have with your health care provider. Document Released: 02/27/2011 Document Revised: 01/20/2016 Document Reviewed: 05/18/2015 Elsevier Interactive Patient Education  [employer].    .    .    .

## 2018-03-20 LAB — ANTI-NUCLEAR AB-TITER (ANA TITER): ANA Titer 1: 1:80 {titer} — ABNORMAL HIGH

## 2018-03-20 LAB — ANA: Anti Nuclear Antibody(ANA): POSITIVE — AB

## 2018-03-20 LAB — CYCLIC CITRUL PEPTIDE ANTIBODY, IGG: Cyclic Citrullin Peptide Ab: <16 UNITS

## 2018-04-05 ENCOUNTER — Encounter: Payer: Self-pay | Admitting: *Deleted

## 2018-04-05 ENCOUNTER — Other Ambulatory Visit: Payer: Self-pay | Admitting: *Deleted

## 2018-04-05 NOTE — Progress Notes (Signed)
Orders have been placed for the patient.

## 2018-04-08 NOTE — Telephone Encounter (Signed)
Also, Dr. Regis Bill requested a sleep lab contact me about apnea.So far, no one has contacted me. Should I try to do this instead?I do not know the lab to which she referred me. My best,   Dr. Regis Bill, please advise. Thank you!!

## 2018-04-08 NOTE — Telephone Encounter (Signed)
Please check on this referral to pulmonary for poss sleep apnea evaluation otherwise if needed we can go with another sleep specialty team.   And let patient know

## 2018-04-12 ENCOUNTER — Encounter: Payer: Self-pay | Admitting: Allergy & Immunology

## 2018-04-12 ENCOUNTER — Ambulatory Visit: Payer: BC Managed Care – PPO | Admitting: Allergy & Immunology

## 2018-04-12 VITALS — BP 104/78 | HR 72 | Temp 98.1°F | Resp 20

## 2018-04-12 DIAGNOSIS — J3089 Other allergic rhinitis: Secondary | ICD-10-CM | POA: Diagnosis not present

## 2018-04-12 DIAGNOSIS — J683 Other acute and subacute respiratory conditions due to chemicals, gases, fumes and vapors: Secondary | ICD-10-CM | POA: Diagnosis not present

## 2018-04-12 DIAGNOSIS — J302 Other seasonal allergic rhinitis: Secondary | ICD-10-CM

## 2018-04-12 NOTE — Patient Instructions (Addendum)
1. Chronic rhinitis (trees, weeds, grasses, indoor molds, outdoor molds, dust mites, cat and dog) - I am glad that you are doing better with the medications!  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  - Continue with: Singulair (montelukast) 10mg  once daily, Flonase (fluticasone) one spray per nostril daily, Patanase (olopatadine) two sprays per nostril 1-2 times daily as needed and Pataday (olopatadine) one drop per eye twice daily as needed - Consider nasal saline rinses 1-2 times daily to remove allergens from the nasal cavities as well as help with mucous clearance (this is especially helpful to do before the nasal sprays are given) - Consider allergy shots as a means of long-term control. - Call us when you make a determination.   2. Sensitivity to scents/perfumes - Avoid as much as you are able. - Continue with the use of a mask as needed at work.  3. Return in about 1 year (around 04/13/2019).   Please inform us of any Emergency Department visits, hospitalizations, or changes in symptoms. Call us before going to the ED for breathing or allergy symptoms since we might be able to fit you in for a sick visit. Feel free to contact us anytime with any questions, problems, or concerns.  It was a pleasure to see you again today! I am so glad that you are doing better!   Websites that have reliable patient information: 1. American Academy of Asthma, Allergy, and Immunology: www.aaaai.org 2. Food Allergy Research and Education (FARE): foodallergy.org 3. Mothers of Asthmatics: http://www.asthmacommunitynetwork.org 4. American College of Allergy, Asthma, and Immunology: MonthlyElectricBill.co.uk   Make sure you are registered to vote! If you have moved or changed any of your contact information, you will need to get this updated before voting!

## 2018-04-12 NOTE — Progress Notes (Signed)
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 Chronic rhinitis (trees, weeds, grasses, indoor molds, outdoor molds, dust mites, cat and dog) - I am glad that you are doing better with the medications. 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  - Continue with: Singulair (montelukast) 10mg  once daily, Flonase (fluticasone) one spray per nostril daily, Patanase (olopatadine) two sprays per nostril 1-2 times daily as needed and Pataday (olopatadine) one drop per eye twice daily as needed - Consider nasal saline rinses 1-2 times daily to remove allergens from the nasal cavities as well as help with mucous clearance (this is especially helpful to do before the nasal sprays are given) - Consider allergy shots as a means of long-term control. - Call us when you make a determination.   2. Sensitivity to scents/perfumes - Avoid as much as you are able. - Continue with the use of a mask as needed at work.  3. Return in about 1 year (around 04/13/2019).  Subjective:   Audrey Jenkins is a 62 y.o. female presenting today for follow up of  Chief Complaint  Patient presents with  . Allergic Rhinitis     allergies are better    Audrey Jenkins has a history of the following: Patient Active Problem List   Diagnosis Date Noted  . Reactive airways dysfunction syndrome ([place]) 01/04/2018  . Seasonal and perennial allergic rhinitis 01/04/2018  . Migraine without aura and without status migrainosus, not intractable 02/23/2014  . Shoulder pain, left 02/23/2014  . Obesity (BMI 30-39.9) 02/23/2014  . Visit for  preventive health examination 02/06/2013  . Epicondylitis, lateral (tennis elbow) 02/06/2013  . Medication management 02/06/2013  . Routine gynecological examination 01/31/2012  . Routine general medical examination at a health care facility 01/31/2012  . OBESITY 02/01/2010  . SNORING 02/01/2010  . LIVER FUNCTION TESTS, ABNORMAL 02/01/2010  . OTHER CHRONIC DISEASE OF TONSILS AND ADENOIDS 07/13/2009  . FOOT PAIN, RIGHT 07/13/2009  . ADJUSTMENT DISORDER WITH ANXIOUS MOOD 11/19/2007  . OSTEOARTHRITIS 11/19/2007  . VITAMIN D DEFICIENCY 07/31/2007  . ARTHROPATHY NEC, MULTIPLE SITES 05/22/2007  . Hyperlipidemia 03/06/2007  . ALLERGIC RHINITIS 03/06/2007  . HEADACHE 03/06/2007   History obtained from: chart review and patient, who is quite talkative.  Audrey Jenkins's Primary Care Provider is Panosh, Standley Brooking, MD.     Audrey Jenkins is a 62 y.o. female presenting for a follow up visit.  She was last seen in May 2019.  At that time, she was having marked allergic rhinitis symptoms.  She has testing that was positive to trees, weeds, grasses, mold, dust mite, cat, and dog.  We continued her on montelukast 10 mg daily and Flonase 1 spray per nostril daily.  We started Patanase 2 sprays per nostril 1-2 times daily and Pataday eyedrops as needed.  We did discuss allergy shots as a means of long-term control.  She was also reporting sensitivity to sense of perfumes.  I recommended avoidance, or as much as she was able to do include any use of a mask as needed.  Since the last visit, she has done very well. She  thinks that the Patanase was causing some sleepiness. She is very interested in the allergy shots. Overall she feels much better than she did in the spring time. She is interested in the shots but she would like to get it at her workplace. 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 She is very open to getting her injections, but cannot take the  time off of work to come and get the injections.   In any case, her breathing seems to have improved since the last visit. She is having some problems with her work place, which does have some water damage.   Otherwise, there have been no changes to her past medical history, surgical history, family history, or social history.    Review of Systems: a 14-point review of systems is pertinent for what is mentioned in HPI.  Otherwise, all other systems were negative. Constitutional: negative other than that listed in the HPI Eyes: negative other than that listed in the HPI Ears, nose, mouth, throat, and face: negative other than that listed in the HPI Respiratory: negative other than that listed in the HPI Cardiovascular: negative other than that listed in the HPI Gastrointestinal: negative other than that listed in the HPI Genitourinary: negative other than that listed in the HPI Integument: negative other than that listed in the HPI Hematologic: negative other than that listed in the HPI Musculoskeletal: negative other than that listed in the HPI Neurological: negative other than that listed in the HPI Allergy/Immunologic: negative other than that listed in the HPI    Objective:   Blood pressure 104/78, pulse 72, temperature 98.1 F (36.7 C), temperature source Oral, resp. rate 20, SpO2 97 %. There is no height or weight on file to calculate BMI.   Physical Exam:  General: Alert, interactive, in no acute distress.  Eyes: No conjunctival injection bilaterally, no discharge on the right, no discharge on the left and no Horner-Trantas dots present. PERRL bilaterally. EOMI without pain. No photophobia.  Ears: Right TM pearly gray with normal light reflex, Left TM pearly gray with normal light reflex, Right TM intact without perforation and Left TM intact without perforation.  Nose/Throat: External nose within normal limits and septum midline. Turbinates minimally edematous without  discharge. Posterior oropharynx mildly erythematous without cobblestoning in the posterior oropharynx. Tonsils 2+ without exudates.  Tongue without thrush. Lungs: Clear to auscultation without wheezing, rhonchi or rales. No increased work of breathing. CV: Normal S1/S2. No murmurs. Capillary refill <2 seconds.  Skin: Warm and dry, without lesions or rashes. Neuro:   Grossly intact. No focal deficits appreciated. Responsive to questions.  Diagnostic studies: none      Salvatore Marvel, MD  [facility]

## 2018-04-19 ENCOUNTER — Other Ambulatory Visit: Payer: Self-pay | Admitting: Internal Medicine

## 2018-04-19 NOTE — Telephone Encounter (Signed)
Last filled 11/28/16 Upcoming OV 06/2018  Please advise Dr Regis Bill, thanks.

## 2018-04-26 ENCOUNTER — Ambulatory Visit: Payer: BC Managed Care – PPO | Admitting: Pulmonary Disease

## 2018-04-26 ENCOUNTER — Encounter: Payer: Self-pay | Admitting: Pulmonary Disease

## 2018-04-26 VITALS — BP 128/82 | HR 78 | Ht 64.0 in | Wt 231.0 lb

## 2018-04-26 DIAGNOSIS — G4733 Obstructive sleep apnea (adult) (pediatric): Secondary | ICD-10-CM

## 2018-04-26 NOTE — Progress Notes (Signed)
Audrey Jenkins    [id number]    [birth year]  Primary Care Physician:Panosh, Standley Brooking, MD  Referring Physician: Burnis Medin, [place], [place] [address]  Chief complaint:  History of excessive daytime sleepiness Significant snoring  HPI:  History of significant snoring, witnessed apneas Her spouse has had to move to the next room of a loud snoring [place] of 13 Usually goes to bed between 9:30 PM and midnight, able to fall asleep pretty quickly, wakes up about 7:30 AM to 10 Nonrestorative sleep No memory issues No chronic headaches  Has always had snoring Denies significant health history About 10 pound weight gain recently   Outpatient Encounter Medications as of 04/26/2018  Medication Sig  . acetaminophen (TYLENOL) 500 MG tablet Take 500-1,000 mg by mouth every 6 (six) hours as needed.  Marland Kitchen atorvastatin (LIPITOR) 80 MG tablet TAKE 1 TABLET EVERY DAY  . eletriptan (RELPAX) 40 MG tablet TAKE 1 TABLET BY MOUTH AS NEEDED FOR MIGRAINE OR HEADACHE. MAY REPEAT IN 2 HOURS IF NEEDED  . escitalopram (LEXAPRO) 10 MG tablet Take 1 tablet (10 mg total) by mouth daily.  Marland Kitchen estradiol (ESTRACE VAGINAL) 0.1 MG/GM vaginal cream Place 1 Applicatorful vaginally at bedtime. For 2 weeks and then 3 x per week or prn  . fexofenadine (ALLEGRA) 180 MG tablet Take 180 mg by mouth daily.  . fluticasone (FLONASE) 50 MCG/ACT nasal spray Place 1 spray into both nostrils daily.  Marland Kitchen ibuprofen (ADVIL,MOTRIN) 200 MG tablet Take 400 mg by mouth every 6 (six) hours as needed.  . meloxicam (MOBIC) 15 MG tablet TAKE 1 TABLET BY MOUTH DAILY.  . montelukast (SINGULAIR) 10 MG tablet Take 1 tablet (10 mg total) by mouth at bedtime.  . Olopatadine HCl 0.2 % SOLN Apply 2 drops to eye daily as needed.  . Olopatadine HCl 0.6 % SOLN Place 1 spray into the nose 2 (two) times daily. (Patient taking differently: Place 1 spray into the nose 2 (two) times daily as needed. )   No  facility-administered encounter medications on file as of 04/26/2018.     Allergies as of 04/26/2018 - Review Complete 04/26/2018  Allergen Reaction Noted  . Cetirizine hcl  05/22/2008  . Codeine  04/26/2018  . Pravastatin sodium  07/15/2009  . Propoxyphene n-acetaminophen  05/22/2008  . Rhinocort [budesonide]  01/04/2018  . Zetia [ezetimibe]  10/08/2017    Past Medical History:  Diagnosis Date  . Abnormal LFTs   . Allergy   . Depression   . Fatty liver    on [place] Jan 2011  . Headache(784.0)   . Hyperlipidemia    Inital 400's  . Mood disorder ([place])   . Osteoarthritis   . Primiparous   . Snoring    ENT [facility] Jan 2011 nasal obstruction and cryptic tonsils    Past Surgical History:  Procedure Laterality Date  . BREAST SURGERY  1995   bx  . dental implant Left 11/2016    Family History  Problem Relation Age of Onset  . Arthritis Mother   . COPD Mother   . Dementia Mother   . Endometrial cancer Mother   . Skin cancer Mother   . Osteoporosis Mother   . Other Mother        dementia  . Heart attack Father   . Hypertension Father        cabd Mi 60 died CHF  . Heart failure Father   .  Heart disease Father   . Sleep apnea Sister        improved with weight loss  . Arthritis Sister   . Allergic rhinitis Sister   . Coronary artery disease Sister   . Vasculitis Sister   . Allergic rhinitis Sister   . Angioedema Neg Hx   . Asthma Neg Hx   . Eczema Neg Hx   . Immunodeficiency Neg Hx   . Urticaria Neg Hx     Social History   Socioeconomic History  . Marital status: Married    Spouse name: Not on file  . Number of children: Not on file  . Years of education: Not on file  . Highest education level: Not on file  Occupational History  . Not on file  Social Needs  . Financial resource strain: Not on file  . Food insecurity:    Worry: Not on file    Inability: Not on file  . Transportation needs:    Medical: Not on file    Non-medical: Not on file  Tobacco  Use  . Smoking status: Never Smoker  . Smokeless tobacco: Never Used  Substance and Sexual Activity  . Alcohol use: Yes    Alcohol/week: 0.0 standard drinks    Comment: socially  . Drug use: No  . Sexual activity: Not on file  Lifestyle  . Physical activity:    Days per week: Not on file    Minutes per session: Not on file  . Stress: Not on file  Relationships  . Social connections:    Talks on phone: Not on file    Gets together: Not on file    Attends religious service: Not on file    Active member of club or organization: Not on file    Attends meetings of clubs or organizations: Not on file    Relationship status: Not on file  . Intimate partner violence:    Fear of current or ex partner: Not on file    Emotionally abused: Not on file    Physically abused: Not on file    Forced sexual activity: Not on file  Other Topics Concern  . 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  HENT: Negative.   Eyes: Negative.   Respiratory: Positive for apnea.   Endocrine: Negative.   Genitourinary: Negative.   Allergic/Immunologic: Negative.   Psychiatric/Behavioral: Positive for sleep disturbance.    Vitals:   04/26/18 1532  BP: 128/82  Pulse: 78  SpO2: 96%     Physical Exam  Constitutional: She appears well-developed and well-nourished.  HENT:  Head: Normocephalic and atraumatic.  Eyes: Pupils are equal, round, and reactive to light. Conjunctivae and EOM are normal. Right eye exhibits no discharge. Left eye exhibits no discharge.  Neck: Normal range of motion. Neck supple. No tracheal deviation present. No thyromegaly present.  Cardiovascular: Normal rate and regular rhythm.  No murmur heard. Pulmonary/Chest: Effort  normal and breath sounds normal. No respiratory distress.  Abdominal: Soft. Bowel sounds are normal. She exhibits no distension. There is no tenderness.  Musculoskeletal: Normal range of motion. She exhibits no edema.  Neurological: She is alert.     Data Reviewed: Records reviewed  Assessment:  High probability of significant sleep disordered breathing   Obesity  Rhinitis  Nonrestorative sleep  Plan/Recommendations:  We will set up for home sleep study  Importance of weight loss was discussed  Pathophysiology of sleep disordered breathing discussed  Treatment options of sleep disordered breathing discussed  I will see her back in the office about 6 to 8 weeks following initiation of treatment  Encouraged to call if any significant concerns/questions   Sherrilyn Rist MD [facility] 04/26/2018, 3:57 PM  CC: Panosh, Standley Brooking, MD

## 2018-04-26 NOTE — Addendum Note (Signed)
Addended by: Madolyn Frieze on: 04/26/2018 04:28 PM   Modules accepted: Orders

## 2018-04-26 NOTE — Addendum Note (Signed)
Addended by: Madolyn Frieze on: 04/26/2018 04:48 PM   Modules accepted: Orders

## 2018-04-26 NOTE — Patient Instructions (Signed)
High probability of sleep disordered breathing   We will set you up with a home sleep study  Possible treatment with auto titrating CPAP therapy  Encourage continuing weight loss  We will see you back in the office 6 to 8 weeks after initiation of treatment

## 2018-05-13 ENCOUNTER — Other Ambulatory Visit: Payer: Self-pay | Admitting: Internal Medicine

## 2018-05-22 DIAGNOSIS — G4733 Obstructive sleep apnea (adult) (pediatric): Secondary | ICD-10-CM

## 2018-05-23 DIAGNOSIS — G4733 Obstructive sleep apnea (adult) (pediatric): Secondary | ICD-10-CM | POA: Diagnosis not present

## 2018-05-24 ENCOUNTER — Other Ambulatory Visit: Payer: Self-pay | Admitting: *Deleted

## 2018-05-24 DIAGNOSIS — G4733 Obstructive sleep apnea (adult) (pediatric): Secondary | ICD-10-CM

## 2018-06-03 ENCOUNTER — Telehealth: Payer: Self-pay | Admitting: Pulmonary Disease

## 2018-06-03 DIAGNOSIS — G4733 Obstructive sleep apnea (adult) (pediatric): Secondary | ICD-10-CM

## 2018-06-03 NOTE — Telephone Encounter (Signed)
Dr. Ander Slade has reviewed the home sleep test this showed Moderately severe sleep apnea with mild oxygen desaturations.Bradycardia with sleep disordered breathing events.    Recommendations   Treatment options are CPAP with the settings auto 5 to 15.    Weight loss measures .   Advise against driving while sleepy & against medication with sedative side effects.    Spoke with patient she is aware of results and order has been placed and apt made. Nothing further needed at this time.   Make appointment for 8 to 10 weeks for compliance with download with Dr. Ander Slade.

## 2018-06-26 ENCOUNTER — Other Ambulatory Visit: Payer: Self-pay | Admitting: Allergy & Immunology

## 2018-07-03 ENCOUNTER — Other Ambulatory Visit: Payer: Self-pay | Admitting: Internal Medicine

## 2018-07-05 ENCOUNTER — Other Ambulatory Visit (INDEPENDENT_AMBULATORY_CARE_PROVIDER_SITE_OTHER): Payer: BC Managed Care – PPO

## 2018-07-05 LAB — HEPATIC FUNCTION PANEL
ALBUMIN: 4 g/dL (ref 3.5–5.2)
ALT: 29 U/L (ref 0–35)
AST: 25 U/L (ref 0–37)
Alkaline Phosphatase: 130 U/L — ABNORMAL HIGH (ref 39–117)
Bilirubin, Direct: 0.1 mg/dL (ref 0.0–0.3)
TOTAL PROTEIN: 6.9 g/dL (ref 6.0–8.3)
Total Bilirubin: 0.7 mg/dL (ref 0.2–1.2)

## 2018-07-05 LAB — GAMMA GT: GGT: 69 U/L — ABNORMAL HIGH (ref 7–51)

## 2018-07-05 LAB — VITAMIN D 25 HYDROXY (VIT D DEFICIENCY, FRACTURES): VITD: 11.85 ng/mL — ABNORMAL LOW (ref 30.00–100.00)

## 2018-07-09 LAB — MITOCHONDRIAL ANTIBODIES: Mitochondrial M2 Ab, IgG: <=20 U

## 2018-07-18 NOTE — Progress Notes (Signed)
Chief Complaint  Patient presents with  . Follow-up    Doing well other than a current cold. Pt c/o cough, congestion, sore throat and headache x 1 week. Blowing bloody mucous/clots from nose and some chest congestion opaq in color. Some body aches and fatigue. Sinus pressure.  Denies fever.  OTC Tylenol and DayQuil.    HPI: Audrey Jenkins 62 y.o. come in for fu abn alk phos   But has above sx for a week  Not getting better  Congested no fever   Pain right upper  Forehead .  "I think I have a sinus infection. "  Right  Nostril had large blood clot   Today    Missed some work this week    ROS: See pertinent positives and negatives per HPI. No cp sob     Burn out feeling from work  Plans retire next year Last colon about 10 years ago    Past Medical History:  Diagnosis Date  . Abnormal LFTs   . Allergy   . Depression   . Fatty liver    on [place] Jan 2011  . Headache(784.0)   . Hyperlipidemia    Inital 400's  . Mood disorder ([place])   . Osteoarthritis   . Primiparous   . Snoring    ENT [facility] Jan 2011 nasal obstruction and cryptic tonsils    Family History  Problem Relation Age of Onset  . Arthritis Mother   . COPD Mother   . Dementia Mother   . Endometrial cancer Mother   . Skin cancer Mother   . Osteoporosis Mother   . Other Mother        dementia  . Heart attack Father   . Hypertension Father        cabd Mi 19 died CHF  . Heart failure Father   . Heart disease Father   . Sleep apnea Sister        improved with weight loss  . Arthritis Sister   . Allergic rhinitis Sister   . Coronary artery disease Sister   . Vasculitis Sister   . Allergic rhinitis Sister   . Angioedema Neg Hx   . Asthma Neg Hx   . Eczema Neg Hx   . Immunodeficiency Neg Hx   . Urticaria Neg Hx     Social History   Socioeconomic History  . Marital status: Married    Spouse name: Not on file  . Number of children: Not on file  . Years of education: Not on file  . Highest education level:  Not on file  Occupational History  . Not on file  Social Needs  . Financial resource strain: Not on file  . Food insecurity:    Worry: Not on file    Inability: Not on file  . Transportation needs:    Medical: Not on file    Non-medical: Not on file  Tobacco Use  . Smoking status: Never Smoker  . Smokeless tobacco: Never Used  Substance and Sexual Activity  . Alcohol use: Yes    Alcohol/week: 0.0 standard drinks    Comment: socially  . Drug use: No  . Sexual activity: Not on file  Lifestyle  . Physical activity:    Days per week: Not on file    Minutes per session: Not on file  . Stress: Not on file  Relationships  . Social connections:    Talks on phone: Not on file    Gets together:  Not on file    Attends religious service: Not on file    Active member of club or organization: Not on file    Attends meetings of clubs or organizations: Not on file    Relationship status: Not on file  Other Topics Concern  . 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 acetaminophen (TYLENOL) 500 MG tablet Take 500-1,000 mg by mouth every 6 (six) hours as needed.    Marland Kitchen atorvastatin (LIPITOR) 80 MG tablet TAKE 1 TABLET EVERY DAY 90 tablet 2  . eletriptan (RELPAX) 40 MG tablet TAKE 1 TABLET BY MOUTH AS NEEDED FOR MIGRAINE OR HEADACHE. MAY REPEAT IN 2 HOURS IF NEEDED 9 tablet 1  . escitalopram (LEXAPRO) 10 MG tablet TAKE 1 TABLET BY MOUTH EVERY DAY 30 tablet 0  . estradiol (ESTRACE) 0.1 MG/GM vaginal cream PLACE 1 APPLICATORFUL VAGINALLY AT BEDTIME. FOR 2 WEEKS AND THEN 3 X PER WEEK OR PRN 42.5 g 5  . fexofenadine (ALLEGRA) 180 MG tablet Take 180 mg by mouth daily.    . fluticasone (FLONASE) 50 MCG/ACT nasal spray PLACE 1 SPRAY  INTO BOTH NOSTRILS DAILY. 18.2 g 5  . ibuprofen (ADVIL,MOTRIN) 200 MG tablet Take 400 mg by mouth every 6 (six) hours as needed.    . meloxicam (MOBIC) 15 MG tablet TAKE 1 TABLET BY MOUTH DAILY. 30 tablet 3  . montelukast (SINGULAIR) 10 MG tablet Take 1 tablet (10 mg total) by mouth at bedtime. 30 tablet 5  . Olopatadine HCl 0.2 % SOLN Apply 2 drops to eye daily as needed. 2.5 mL 5  . Olopatadine HCl 0.6 % SOLN Place 1 spray into the nose 2 (two) times daily. (Patient taking differently: Place 1 spray into the nose 2 (two) times daily as needed. ) 30.5 Bottle 5   No facility-administered medications prior to visit.      EXAM:  BP 112/78 (BP Location: Left Arm, Patient Position: Sitting, Cuff Size: Large)   Pulse 62   Temp 98.6 F (37 C) (Oral)   Wt 235 lb (106.6 kg)   SpO2 95%   BMI 40.34 kg/m   Body mass index is 40.34 kg/m. WDWN in NAD  quiet respirations; mildly congested  somewhat hoarse. Non toxic . But  Not feeling well  HEENT: Normocephalic ;atraumatic , Eyes;  PERRL, EOMs  Full, lids and conjunctiva clear,,Ears: no deformities, canals nl, TM landmarks normal, Nose: no deformity or discharge but congested;face r frontal ethmoid are  Is tender Mouth : OP clear without lesion or edema . Neck: Supple without adenopathy or masses or bruits Chest:  Clear to A&P without wheezes rales or rhonchi CV:  S1-S2 no gallops or murmurs peripheral perfusion is normal Abdomen:  Sof,t normal bowel sounds without hepatosplenomegaly, no guarding rebound or masses no CVA tenderness  Skin :nl perfusion and no acute rashes    Lab Results  Component Value Date   WBC 7.3 03/18/2018   HGB 12.4 03/18/2018   HCT 37.6 03/18/2018   PLT 306.0 03/18/2018   GLUCOSE 97 03/18/2018   CHOL 224 (H) 03/18/2018   TRIG 138.0  03/18/2018   HDL 61.10 03/18/2018   LDLDIRECT 157.4 02/19/2014   LDLCALC 136 (H) 03/18/2018   ALT 29 07/05/2018   AST 25 07/05/2018   NA 141 03/18/2018   K 4.4 03/18/2018   CL  105 03/18/2018   CREATININE 0.76 03/18/2018   BUN 11 03/18/2018   CO2 28 03/18/2018   TSH 2.74 03/18/2018   BP Readings from Last 3 Encounters:  07/19/18 112/78  04/26/18 128/82  04/12/18 104/78  lab reviewed  Borderline ana  Neg  AMA  elevated GGT   Low VIt d   ASSESSMENT AND PLAN:  Discussed the following assessment and plan:  Alkaline phosphatase elevation - Plan: Ambulatory referral to Gastroenterology  Hyperlipidemia, unspecified hyperlipidemia type  Vitamin D deficiency  Sinusitis, unspecified chronicity, unspecified location  Elevated serum GGT level - Plan: Ambulatory referral to Gastroenterology  Plan GI consults referral .  Also due for colon soon   -Patient advised to return or notify health care team  if  new concerns arise.  Patient Instructions  Use saline nose spray to help the sinus problem and if the head sinus pain is not getting releived in the next  48 hours then add antibiotic .  Sinus infection can trigger a migraine.  Stay hydrated.   Will be contacted about a referral to gastroenterology about the liver lab tests.   Your vit d is low   Take vit d  3      2000 IU per day    ( otc)  Plan follow up  In 4-6 months or as needed     Mariann Laster K. Panosh M.D.

## 2018-07-19 ENCOUNTER — Ambulatory Visit: Payer: BC Managed Care – PPO | Admitting: Internal Medicine

## 2018-07-19 ENCOUNTER — Encounter: Payer: Self-pay | Admitting: Internal Medicine

## 2018-07-19 VITALS — BP 112/78 | HR 62 | Temp 98.6°F | Wt 235.0 lb

## 2018-07-19 DIAGNOSIS — J329 Chronic sinusitis, unspecified: Secondary | ICD-10-CM

## 2018-07-19 DIAGNOSIS — E785 Hyperlipidemia, unspecified: Secondary | ICD-10-CM

## 2018-07-19 DIAGNOSIS — R748 Abnormal levels of other serum enzymes: Secondary | ICD-10-CM

## 2018-07-19 DIAGNOSIS — E559 Vitamin D deficiency, unspecified: Secondary | ICD-10-CM

## 2018-07-19 MED ORDER — AMOXICILLIN 500 MG PO CAPS: 500.0000 mg | ORAL_CAPSULE | Freq: Three times a day (TID) | ORAL | 0 refills | Status: DC

## 2018-07-19 NOTE — Patient Instructions (Addendum)
Use saline nose spray to help the sinus problem and if the head sinus pain is not getting releived in the next  48 hours then add antibiotic .  Sinus infection can trigger a migraine.  Stay hydrated.   Will be contacted about a referral to gastroenterology about the liver lab tests.   Your vit d is low   Take vit d  3      2000 IU per day    ( otc)  Plan follow up  In 4-6 months or as needed

## 2018-08-04 ENCOUNTER — Other Ambulatory Visit: Payer: Self-pay | Admitting: Allergy & Immunology

## 2018-08-23 ENCOUNTER — Ambulatory Visit: Payer: Self-pay

## 2018-08-23 NOTE — Telephone Encounter (Signed)
Left detailed message with recommendations Nothing further needed.

## 2018-08-23 NOTE — Telephone Encounter (Signed)
Message from Sharene Skeans sent at 08/23/2018 9:17 AM EST   Summary: advise    Pt called in to ask if Dr. Regis Bill can call in something for her. Pt was seen in late November with the same sinus infection/sick symptoms(cough, head aches, body pain and chills) and wants to see if she can be prescribed something again/ please advise          Pt returning call to office and states that she has been experiencing sinus pain and congestion. Pt states she has pain around her eyebrows and between her eyes. Pt sates she has been feeling achy all over with a runny nose, coughing, congestion and experienced chills last night. Pt states she is having drainage but has not looked at the color. Pt advised that she would need to be seen for an office visit but pt declines at this time and states that she will "doctor it on my own".sPt states she lives in [place] and doesn't feel like driving and her husband is sick as well. Pt requesting that provider call medication into the pharmacy to treat current symptoms. LOV on 07/19/18 with Dr. Regis Bill and pt states she was experiencing some of the same symptoms.  Reason for Disposition . [1] Sinus congestion (pressure, fullness) AND [2] present > 10 days  Answer Assessment - Initial Assessment Questions 1. LOCATION: "Where does it hurt?"      Around eyebrows and between eyes 2. ONSET: "When did the sinus pain start?"  (e.g., hours, days)      Has had sinus congestion and pain since being seen on Nov 22 3. SEVERITY: "How bad is the pain?"   (Scale 1-10; mild, moderate or severe)   - MILD (1-3): doesn't interfere with normal activities    - MODERATE (4-7): interferes with normal activities (e.g., work or school) or awakens from sleep   - SEVERE (8-10): excruciating pain and patient unable to do any normal activities        Pt states pain is at a 5 now and has been taking Motrin, Dayquil and Nyquil 4. RECURRENT SYMPTOM: "Have you ever had sinus problems before?" If  so, ask: "When was the last time?" and "What happened that time?"      Yes was seen for OV on Nov 22 for same issues 5. NASAL CONGESTION: "Is the nose blocked?" If so, ask, "Can you open it or must you breathe through the mouth?"     Pt states nose is congested but still able to breath through nose 6. NASAL DISCHARGE: "Do you have discharge from your nose?" If so ask, "What color?"     Yes has drainage, but pt states she hasn't looked at the color of the drainage 7. FEVER: "Do you have a fever?" If so, ask: "What is it, how was it measured, and when did it start?"      No, has not checked temperature but states she had chills during the night 8. OTHER SYMPTOMS: "Do you have any other symptoms?" (e.g., sore throat, cough, earache, difficulty breathing)     Sore throat, cough,, runny nose and aching all over 9. PREGNANCY: "Is there any chance you are pregnant?" "When was your last menstrual period?"     n/a  Protocols used: SINUS PAIN OR CONGESTION-A-AH

## 2018-08-23 NOTE — Telephone Encounter (Signed)
She could have the flu   Or viral illness  She could be seen in the Saturday clinic  . At this time would have to see her to decide what advice  o rpan would be in order.

## 2018-08-23 NOTE — Telephone Encounter (Signed)
Please advise Dr Panosh, thanks.   

## 2018-08-27 ENCOUNTER — Ambulatory Visit: Payer: BC Managed Care – PPO | Admitting: Pulmonary Disease

## 2018-09-23 ENCOUNTER — Encounter: Payer: Self-pay | Admitting: Internal Medicine

## 2018-09-26 ENCOUNTER — Other Ambulatory Visit: Payer: Self-pay | Admitting: Internal Medicine

## 2018-10-10 ENCOUNTER — Other Ambulatory Visit: Payer: Self-pay | Admitting: Internal Medicine

## 2018-12-10 ENCOUNTER — Other Ambulatory Visit: Payer: Self-pay | Admitting: Internal Medicine

## 2019-01-16 NOTE — Progress Notes (Signed)
Chief Complaint  Patient presents with  . Medication Management  . Follow-up    HPI: Audrey Jenkins 63 y.o. come in for Chronic disease management   In office visit   Instead of virtual  But nas negeataive covid screen hx  LOw VitD    Since last   Visit  Taking  2000 iu per day .  Ate last night .  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  Bit by tick  And off quickly but no rash sx     hands arthritis a bet worse  Thumbs  Poss cmp   Tylenol 1-2 as needed x per day  this spring.   rx  osa  Use    Now using with help   Seasonal allergies stable  Not too bad this year  On meds   ROS: See pertinent positives and negatives per HPI. No cp nes sob cough   Past Medical History:  Diagnosis Date  . Abnormal LFTs   . Allergy   . Depression   . Fatty liver    on [place] 10/16/2009  . Headache(784.0)   . Hyperlipidemia    Inital 400's  . Mood disorder ([place])   . Osteoarthritis   . Primiparous   . Snoring    ENT [facility] October 16, 2009 nasal obstruction and cryptic tonsils    Family History  Problem Relation Age of Onset  . Arthritis Mother   . COPD Mother   . Dementia Mother   . Endometrial cancer Mother   . Skin cancer Mother   . Osteoporosis Mother   . Other Mother        dementia  . Heart attack Father   . Hypertension Father        cabd Mi 93 died CHF  . Heart failure Father   . Heart disease Father   . Sleep apnea Sister        improved with weight loss  . Arthritis Sister   . Allergic rhinitis Sister   . Coronary artery disease Sister   . Vasculitis Sister   . Allergic rhinitis Sister   . Angioedema Neg Hx   . Asthma Neg Hx   . Eczema Neg Hx   . Immunodeficiency Neg Hx   . Urticaria Neg Hx     Social History    Socioeconomic History  . Marital status: Married    Spouse name: Not on file  . Number of children: Not on file  . Years of education: Not on file  . Highest education level: Not on file  Occupational History  . Not on file  Social Needs  . Financial resource strain: Not on file  . Food insecurity:    Worry: Not on file    Inability: Not on file  . Transportation needs:    Medical: Not on file    Non-medical: Not on file  Tobacco Use  . Smoking status: Never Smoker  . Smokeless tobacco: Never Used  Substance and Sexual Activity  . Alcohol use: Yes    Alcohol/week: 0.0 standard  drinks    Comment: socially  . Drug use: No  . Sexual activity: Not on file  Lifestyle  . Physical activity:    Days per week: Not on file    Minutes per session: Not on file  . Stress: Not on file  Relationships  . Social connections:    Talks on phone: Not on file    Gets together: Not on file    Attends religious service: Not on file    Active member of club or organization: Not on file    Attends meetings of clubs or organizations: Not on file    Relationship status: Not on file  Other Topics Concern  . Not on file  Social History Narrative   Married   [place] of 2   [place]   Works with Geophysicist/field seismologist    employed [employer]  Teaches  Full time    6 Energy manager some older    Neg tobacco ets rare etoh          Outpatient Medications Prior to Visit  Medication Sig Dispense Refill  . acetaminophen (TYLENOL) 500 MG tablet Take 500-1,000 mg by mouth every 6 (six) hours as needed.    Marland Kitchen atorvastatin (LIPITOR) 80 MG tablet TAKE 1 TABLET EVERY DAY 90 tablet 2  . eletriptan (RELPAX) 40 MG tablet TAKE 1 TABLET BY MOUTH AS NEEDED FOR MIGRAINE OR HEADACHE. MAY REPEAT IN 2 HOURS IF NEEDED 9 tablet 1  . escitalopram (LEXAPRO) 10 MG tablet TAKE 1 TABLET BY MOUTH EVERY DAY 90 tablet 1  . estradiol (ESTRACE) 0.1 MG/GM vaginal cream PLACE 1 APPLICATORFUL VAGINALLY AT BEDTIME.  FOR 2 WEEKS AND THEN 3 X PER WEEK OR PRN 42.5 g 5  . fexofenadine (ALLEGRA) 180 MG tablet Take 180 mg by mouth daily.    . fluticasone (FLONASE) 50 MCG/ACT nasal spray PLACE 1 SPRAY INTO BOTH NOSTRILS DAILY. 18.2 g 5  . ibuprofen (ADVIL,MOTRIN) 200 MG tablet Take 400 mg by mouth every 6 (six) hours as needed.    . meloxicam (MOBIC) 15 MG tablet TAKE 1 TABLET BY MOUTH DAILY. 30 tablet 3  . montelukast (SINGULAIR) 10 MG tablet TAKE 1 TABLET BY MOUTH EVERYDAY AT BEDTIME 90 tablet 1  . Olopatadine HCl 0.2 % SOLN Apply 2 drops to eye daily as needed. 2.5 mL 5  . Olopatadine HCl 0.6 % SOLN Place 1 spray into the nose 2 (two) times daily. (Patient taking differently: Place 1 spray into the nose 2 (two) times daily as needed. ) 30.5 Bottle 5  . amoxicillin (AMOXIL) 500 MG capsule Take 1 capsule (500 mg total) by mouth 3 (three) times daily. For sinusitis 21 capsule 0   No facility-administered medications prior to visit.      EXAM:  BP 124/82 (BP Location: Right Arm, Patient Position: Sitting, Cuff Size: Normal)   Pulse 66   Temp 98.2 F (36.8 C) (Oral)   Wt 228 lb 6.4 oz (103.6 kg)   SpO2 95%   BMI 39.20 kg/m   Body mass index is 39.2 kg/m.  GENERAL: vitals reviewed and listed above, alert, oriented, appears well hydrated and in no acute distress HEENT: atraumatic, conjunctiva  clear, no obvious abnormalities on inspection of external nose and ears LUNGS: nl resp effort and we decided to physically distance in the office CV: HRRR, no clubbing cyanosis or  peripheral edema nl cap refill  MS: hand arthritis   No redness  liiks like oa  PSYCH: pleasant and cooperative, no obvious depression or anxiety  Disc about her nieces death Lab Results  Component Value Date   WBC 7.3 03/18/2018   HGB 12.4 03/18/2018   HCT 37.6 03/18/2018   PLT 306.0 03/18/2018   GLUCOSE 97 03/18/2018   CHOL 224 (H) 03/18/2018   TRIG 138.0 03/18/2018   HDL 61.10 03/18/2018   LDLDIRECT 157.4 02/19/2014   LDLCALC  136 (H) 03/18/2018   ALT 29 07/05/2018   AST 25 07/05/2018   NA 141 03/18/2018   K 4.4 03/18/2018   CL 105 03/18/2018   CREATININE 0.76 03/18/2018   BUN 11 03/18/2018   CO2 28 03/18/2018   TSH 2.74 03/18/2018   BP Readings from Last 3 Encounters:  01/17/19 124/82  07/19/18 112/78  04/26/18 128/82   Wt Readings from Last 3 Encounters:  01/17/19 228 lb 6.4 oz (103.6 kg)  07/19/18 235 lb (106.6 kg)  04/26/18 231 lb (104.8 kg)    ASSESSMENT AND PLAN:  Discussed the following assessment and plan:  Alkaline phosphatase elevation - liver vs bone  neg AMA panel in past borderline ana and low vit d - Plan: VITAMIN D 25 Hydroxy (Vit-D Deficiency, Fractures), Hepatic function panel, Gamma GT, Sedimentation rate, C-reactive protein, Lipid panel, Basic metabolic panel, CBC with Differential/Platelet  Vitamin D deficiency - Plan: VITAMIN D 25 Hydroxy (Vit-D Deficiency, Fractures), Hepatic function panel, Gamma GT, Sedimentation rate, C-reactive protein, Lipid panel, Basic metabolic panel, CBC with Differential/Platelet  Elevated serum GGT level - Plan: VITAMIN D 25 Hydroxy (Vit-D Deficiency, Fractures), Hepatic function panel, Gamma GT, Sedimentation rate, C-reactive protein, Lipid panel, Basic metabolic panel, CBC with Differential/Platelet  Hand arthritis - prob OA borderline ana - Plan: VITAMIN D 25 Hydroxy (Vit-D Deficiency, Fractures), Hepatic function panel, Gamma GT, Sedimentation rate, C-reactive protein, Lipid panel, Basic metabolic panel, CBC with Differential/Platelet  Hyperlipidemia, unspecified hyperlipidemia type - Plan: Lipid panel, Basic metabolic panel, CBC with Differential/Platelet  Medication management - Plan: Lipid panel, Basic metabolic panel, CBC with Differential/Platelet  Death of family member  Bereavement counseling  Migraine without aura and without status migrainosus, not intractable - more frequewnt recnetly   OSA on CPAP Lab today  Patient wants to defer  for the  Updated liver US  Last one over 4 years ago showed some fatty liver    We decided also to wait on  Rheum consult .   Counseled about  Niece loss  Disc support groups and counseling  Seems nl for situation Continue weight loss attempts  Vit d  And then fu depndeing ( is to retire in December ) -Patient advised to return or notify health care team  if  new concerns arise.  Patient Instructions  Lab today   Will plan follow up Ultrasound of lvier in future  To confirm no surprises  Work on healthy weight loss  Look into support groups and  Counseling about getting through issues of your nieces death.  Most are doing virtual visits   Plan follow up depending on results and in 4-6 months       Wanda K. Panosh M.D.

## 2019-01-17 ENCOUNTER — Other Ambulatory Visit: Payer: Self-pay

## 2019-01-17 ENCOUNTER — Encounter: Payer: Self-pay | Admitting: Internal Medicine

## 2019-01-17 ENCOUNTER — Ambulatory Visit: Payer: BC Managed Care – PPO | Admitting: Internal Medicine

## 2019-01-17 VITALS — BP 124/82 | HR 66 | Temp 98.2°F | Wt 228.4 lb

## 2019-01-17 DIAGNOSIS — M19049 Primary osteoarthritis, unspecified hand: Secondary | ICD-10-CM

## 2019-01-17 DIAGNOSIS — Z79899 Other long term (current) drug therapy: Secondary | ICD-10-CM

## 2019-01-17 DIAGNOSIS — G4733 Obstructive sleep apnea (adult) (pediatric): Secondary | ICD-10-CM

## 2019-01-17 DIAGNOSIS — E559 Vitamin D deficiency, unspecified: Secondary | ICD-10-CM | POA: Diagnosis not present

## 2019-01-17 DIAGNOSIS — Z634 Disappearance and death of family member: Secondary | ICD-10-CM

## 2019-01-17 DIAGNOSIS — E785 Hyperlipidemia, unspecified: Secondary | ICD-10-CM

## 2019-01-17 DIAGNOSIS — Z9989 Dependence on other enabling machines and devices: Secondary | ICD-10-CM

## 2019-01-17 DIAGNOSIS — R748 Abnormal levels of other serum enzymes: Secondary | ICD-10-CM

## 2019-01-17 DIAGNOSIS — G43009 Migraine without aura, not intractable, without status migrainosus: Secondary | ICD-10-CM

## 2019-01-17 DIAGNOSIS — Z7189 Other specified counseling: Secondary | ICD-10-CM

## 2019-01-17 LAB — CBC WITH DIFFERENTIAL/PLATELET
Basophils Absolute: 0.1 10*3/uL (ref 0.0–0.1)
Basophils Relative: 0.7 % (ref 0.0–3.0)
Eosinophils Absolute: 0.2 10*3/uL (ref 0.0–0.7)
Eosinophils Relative: 3.1 % (ref 0.0–5.0)
HCT: 38.9 % (ref 36.0–46.0)
Hemoglobin: 13 g/dL (ref 12.0–15.0)
Lymphocytes Relative: 28 % (ref 12.0–46.0)
Lymphs Abs: 2.1 10*3/uL (ref 0.7–4.0)
MCHC: 33.3 g/dL (ref 30.0–36.0)
MCV: 84.8 fl (ref 78.0–100.0)
Monocytes Absolute: 0.7 10*3/uL (ref 0.1–1.0)
Monocytes Relative: 8.8 % (ref 3.0–12.0)
Neutro Abs: 4.5 10*3/uL (ref 1.4–7.7)
Neutrophils Relative %: 59.4 % (ref 43.0–77.0)
Platelets: 266 10*3/uL (ref 150.0–400.0)
RBC: 4.59 Mil/uL (ref 3.87–5.11)
RDW: 14.6 % (ref 11.5–15.5)
WBC: 7.6 10*3/uL (ref 4.0–10.5)

## 2019-01-17 LAB — BASIC METABOLIC PANEL
BUN: 11 mg/dL (ref 6–23)
CO2: 25 mEq/L (ref 19–32)
Calcium: 9.6 mg/dL (ref 8.4–10.5)
Chloride: 106 mEq/L (ref 96–112)
Creatinine, Ser: 0.72 mg/dL (ref 0.40–1.20)
GFR: 82.25 mL/min (ref >60.00)
Glucose, Bld: 80 mg/dL (ref 70–99)
Potassium: 4.5 mEq/L (ref 3.5–5.1)
Sodium: 142 mEq/L (ref 135–145)

## 2019-01-17 LAB — HEPATIC FUNCTION PANEL
ALT: 29 U/L (ref 0–35)
AST: 22 U/L (ref 0–37)
Albumin: 4.3 g/dL (ref 3.5–5.2)
Alkaline Phosphatase: 124 U/L — ABNORMAL HIGH (ref 39–117)
Bilirubin, Direct: 0.1 mg/dL (ref 0.0–0.3)
Total Bilirubin: 0.6 mg/dL (ref 0.2–1.2)
Total Protein: 7.2 g/dL (ref 6.0–8.3)

## 2019-01-17 LAB — LIPID PANEL
Cholesterol: 250 mg/dL — ABNORMAL HIGH (ref 0–200)
HDL: 62.5 mg/dL (ref >39.00)
LDL Cholesterol: 157 mg/dL — ABNORMAL HIGH (ref 0–99)
NonHDL: 187.49
Total CHOL/HDL Ratio: 4
Triglycerides: 150 mg/dL — ABNORMAL HIGH (ref 0.0–149.0)
VLDL: 30 mg/dL (ref 0.0–40.0)

## 2019-01-17 LAB — C-REACTIVE PROTEIN: CRP: <1 mg/dL (ref 0.5–20.0)

## 2019-01-17 LAB — GAMMA GT: GGT: 56 U/L — ABNORMAL HIGH (ref 7–51)

## 2019-01-17 LAB — SEDIMENTATION RATE: Sed Rate: 46 mm/hr — ABNORMAL HIGH (ref 0–30)

## 2019-01-17 LAB — VITAMIN D 25 HYDROXY (VIT D DEFICIENCY, FRACTURES): VITD: 23.74 ng/mL — ABNORMAL LOW (ref 30.00–100.00)

## 2019-01-17 NOTE — Patient Instructions (Addendum)
Lab today   Will plan follow up Ultrasound of lvier in future  To confirm no surprises  Work on healthy weight loss  Look into support groups and  Counseling about getting through issues of your nieces death.  Most are doing virtual visits   Plan follow up depending on results and in 4-6 months

## 2019-01-27 ENCOUNTER — Other Ambulatory Visit: Payer: Self-pay

## 2019-01-27 DIAGNOSIS — R748 Abnormal levels of other serum enzymes: Secondary | ICD-10-CM

## 2019-01-27 DIAGNOSIS — E559 Vitamin D deficiency, unspecified: Secondary | ICD-10-CM

## 2019-01-27 MED ORDER — VITAMIN D (ERGOCALCIFEROL) 1.25 MG (50000 UNIT) PO CAPS: 50000.0000 [IU] | ORAL_CAPSULE | ORAL | 0 refills | Status: DC

## 2019-01-27 NOTE — Addendum Note (Signed)
Addended by: Modena Morrow R on: 01/27/2019 02:24 PM   Modules accepted: Orders

## 2019-02-04 ENCOUNTER — Other Ambulatory Visit: Payer: Self-pay | Admitting: Internal Medicine

## 2019-02-11 NOTE — Progress Notes (Signed)
Virtual Visit via Video Note  I connected with@ on 02/12/19 at 11:15 AM EDT by a video enabled telemedicine application and verified that I am speaking with the correct person using two identifiers. Location patient: home Location provider:work office Persons participating in the virtual visit: patient, provider  WIth national recommendations  regarding COVID 19 pandemic   video visit is advised over in office visit for this patient.  Patient aware  of the limitations of evaluation and management by telemedicine and  availability of in person appointments. and agreed to proceed.   HPI: Audrey Jenkins presents for video visit  Requesting note for work setting when returns in fall in regard to risk  exposures herself and her husband who is diabetic . She teaches AT community college and  No accommodations planned   Unless letter   Classroom  Issues not yte resolved  Students may be higher risk exposure    Taking vit d  As planned see lab notes ROS: See pertinent positives and negatives per HPI.  Past Medical History:  Diagnosis Date  . Abnormal LFTs   . Allergy   . Depression   . Fatty liver    on [place] Jan 2011  . Headache(784.0)   . Hyperlipidemia    Inital 400's  . Mood disorder ([place])   . Osteoarthritis   . Primiparous   . Snoring    ENT [facility] Jan 2011 nasal obstruction and cryptic tonsils    Past Surgical History:  Procedure Laterality Date  . BREAST SURGERY  1995   bx  . dental implant Left 11/2016    Family History  Problem Relation Age of Onset  . Arthritis Mother   . COPD Mother   . Dementia Mother   . Endometrial cancer Mother   . Skin cancer Mother   . Osteoporosis Mother   . Other Mother        dementia  . Heart attack Father   . Hypertension Father        cabd Mi 75 died CHF  . Heart failure Father   . Heart disease Father   . Sleep apnea Sister        improved with weight loss  . Arthritis Sister   . Allergic rhinitis Sister   . Coronary artery  disease Sister   . Vasculitis Sister   . Allergic rhinitis Sister   . Angioedema Neg Hx   . Asthma Neg Hx   . Eczema Neg Hx   . Immunodeficiency Neg Hx   . Urticaria Neg Hx     Social History   Tobacco Use  . Smoking status: Never Smoker  . Smokeless tobacco: Never Used  Substance Use Topics  . Alcohol use: Yes    Alcohol/week: 0.0 standard drinks    Comment: socially  . Drug use: No      Current Outpatient Medications:  .  acetaminophen (TYLENOL) 500 MG tablet, Take 500-1,000 mg by mouth every 6 (six) hours as needed., Disp: , Rfl:  .  atorvastatin (LIPITOR) 80 MG tablet, TAKE 1 TABLET EVERY DAY, Disp: 90 tablet, Rfl: 2 .  eletriptan (RELPAX) 40 MG tablet, TAKE 1 TABLET BY MOUTH AS NEEDED FOR MIGRAINE OR HEADACHE. MAY REPEAT IN 2 HOURS IF NEEDED, Disp: 9 tablet, Rfl: 1 .  escitalopram (LEXAPRO) 10 MG tablet, TAKE 1 TABLET BY MOUTH EVERY DAY, Disp: 90 tablet, Rfl: 1 .  estradiol (ESTRACE) 0.1 MG/GM vaginal cream, PLACE 1 APPLICATORFUL VAGINALLY AT BEDTIME. FOR 2 WEEKS  AND THEN 3 X PER WEEK OR PRN, Disp: 42.5 g, Rfl: 5 .  fexofenadine (ALLEGRA) 180 MG tablet, Take 180 mg by mouth daily., Disp: , Rfl:  .  fluticasone (FLONASE) 50 MCG/ACT nasal spray, PLACE 1 SPRAY INTO BOTH NOSTRILS DAILY., Disp: 18.2 g, Rfl: 5 .  ibuprofen (ADVIL,MOTRIN) 200 MG tablet, Take 400 mg by mouth every 6 (six) hours as needed., Disp: , Rfl:  .  meloxicam (MOBIC) 15 MG tablet, TAKE 1 TABLET BY MOUTH DAILY., Disp: 30 tablet, Rfl: 3 .  montelukast (SINGULAIR) 10 MG tablet, TAKE 1 TABLET BY MOUTH EVERYDAY AT BEDTIME, Disp: 90 tablet, Rfl: 1 .  Olopatadine HCl 0.2 % SOLN, Apply 2 drops to eye daily as needed., Disp: 2.5 mL, Rfl: 5 .  Olopatadine HCl 0.6 % SOLN, Place 1 spray into the nose 2 (two) times daily. (Patient taking differently: Place 1 spray into the nose 2 (two) times daily as needed. ), Disp: 30.5 Bottle, Rfl: 5 .  Vitamin D, Ergocalciferol, (DRISDOL) 1.25 MG (50000 UT) CAPS capsule, Take 1  capsule (50,000 Units total) by mouth every 7 (seven) days., Disp: 12 capsule, Rfl: 0  EXAM: BP Readings from Last 3 Encounters:  01/17/19 124/82  07/19/18 112/78  04/26/18 128/82    VITALS per patient if applicable:  GENERAL: alert, oriented, appears well and in no acute distress HEENT: atraumatic, conjunttiva clear, no obvious abnormalities on inspection of external nose and ear NECK: normal movements of the head and neck LUNGS: on inspection no signs of respiratory distress, breathing rate appears normal, no obvious gross SOB, gasping or wheezing CV: no obvious cyanosis PSYCH/NEURO: pleasant and cooperative, no obvious depression or anxiety, speech and thought processing grossly intact Lab Results  Component Value Date   WBC 7.6 01/17/2019   HGB 13.0 01/17/2019   HCT 38.9 01/17/2019   PLT 266.0 01/17/2019   GLUCOSE 80 01/17/2019   CHOL 250 (H) 01/17/2019   TRIG 150.0 (H) 01/17/2019   HDL 62.50 01/17/2019   LDLDIRECT 157.4 02/19/2014   LDLCALC 157 (H) 01/17/2019   ALT 29 01/17/2019   AST 22 01/17/2019   NA 142 01/17/2019   K 4.5 01/17/2019   CL 106 01/17/2019   CREATININE 0.72 01/17/2019   BUN 11 01/17/2019   CO2 25 01/17/2019   TSH 2.74 03/18/2018    ASSESSMENT AND PLAN:  Discussed the following assessment and plan:    ICD-10-CM   1. Educated About Covid-19 Virus Infection  Z71.89   2. Alkaline phosphatase elevation  R74.8 Ambulatory referral to Gastroenterology  3. Elevated serum GGT level  R74.8 Ambulatory referral to Gastroenterology  4. ESR raised  R70.0 Ambulatory referral to Gastroenterology   Note for accomodation  To be written  Patient agreed to proceed with referral  ti gi for  Elevated alk phos and esr  And ggt  Also  And fu in 3 mos regarding vit d etc  Counseled.   Expectant management and discussion of plan and treatment with opportunity to ask questions and all were answered. The patient agreed with the plan and demonstrated an understanding of  the instructions.   Advised to call back or seek an in-person evaluation if worsening  or having  further concerns .     Shanon Ace, MD

## 2019-02-12 ENCOUNTER — Ambulatory Visit (INDEPENDENT_AMBULATORY_CARE_PROVIDER_SITE_OTHER): Payer: BC Managed Care – PPO | Admitting: Internal Medicine

## 2019-02-12 ENCOUNTER — Encounter: Payer: Self-pay | Admitting: Internal Medicine

## 2019-02-12 ENCOUNTER — Other Ambulatory Visit: Payer: Self-pay

## 2019-02-12 DIAGNOSIS — R748 Abnormal levels of other serum enzymes: Secondary | ICD-10-CM

## 2019-02-12 DIAGNOSIS — R700 Elevated erythrocyte sedimentation rate: Secondary | ICD-10-CM

## 2019-02-12 DIAGNOSIS — Z7189 Other specified counseling: Secondary | ICD-10-CM | POA: Diagnosis not present

## 2019-03-03 ENCOUNTER — Other Ambulatory Visit: Payer: Self-pay | Admitting: *Deleted

## 2019-03-03 MED ORDER — MONTELUKAST SODIUM 10 MG PO TABS: 10.0000 mg | ORAL_TABLET | Freq: Every day | ORAL | 0 refills | Status: DC

## 2019-03-13 ENCOUNTER — Telehealth: Payer: Self-pay | Admitting: *Deleted

## 2019-03-13 MED ORDER — MONTELUKAST SODIUM 10 MG PO TABS: 10.0000 mg | ORAL_TABLET | Freq: Every day | ORAL | 1 refills | Status: DC

## 2019-03-13 NOTE — Telephone Encounter (Signed)
Pt called needing montelukast refilled. Med sent and pt has apt scheduled 04/18/19.

## 2019-03-19 ENCOUNTER — Encounter: Payer: Self-pay | Admitting: Gastroenterology

## 2019-03-27 ENCOUNTER — Other Ambulatory Visit: Payer: Self-pay | Admitting: Internal Medicine

## 2019-04-06 ENCOUNTER — Other Ambulatory Visit: Payer: Self-pay | Admitting: Internal Medicine

## 2019-04-14 ENCOUNTER — Other Ambulatory Visit: Payer: Self-pay | Admitting: Internal Medicine

## 2019-04-17 NOTE — Progress Notes (Signed)
Follow Up Note  RE: Audrey Jenkins MRN: [id number] DOB: 09/08/1955 Date of Office Visit: 04/18/2019  Referring provider: Burnis Medin, MD Primary care provider: Burnis Medin, MD  Chief Complaint: Asthma  History of Present Illness: I had the pleasure of seeing Audrey Jenkins for a follow up visit at the [facility] [place] [facility] [place] on 04/18/2019. She is a 63 y.o. female, who is being followed for allergic rhinitis. Today she is here for regular follow up visit.Her previous allergy office visit was on 04/12/2018 with Dr. Ernst Bowler.   1. Allergic rhinitis (trees, weeds, grasses, indoor molds, outdoor molds, dust mites, cat and dog) Currently on Singulair daily, allegra, Flonase 1-2 sprays daily and eye drops prn with good benefit. Patient interested in starting allergy injections.  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   2. Sensitivity to scents/perfumes No acute events.   Assessment and Plan: Artice is a 64 y.o. female with: Seasonal and perennial allergic rhinoconjunctivitis Past history -2019 skin testing was positive to cat, grass, weed, tree, mold, dog, dust mites. Interim history - Doing much better with below regimen but interested in starting allergy injections.   Continue environmental control measures.  Start allergy injections.  Had a detailed discussion with patient/family that clinical history is suggestive of allergic rhinitis, and may benefit from allergy immunotherapy (AIT). Discussed in detail regarding the dosing, schedule, side effects (mild to moderate local allergic reaction and rarely systemic allergic reactions including anaphylaxis), and benefits (significant improvement in nasal symptoms, seasonal flares of asthma) of immunotherapy with the patient. There is significant time commitment involved with allergy shots, which includes weekly immunotherapy injections for first 9-12 months and then biweekly to monthly injections for  3-5 years.   Consent signed.  I have prescribed epinephrine injectable and demonstrated proper use. For mild symptoms you can take over the counter antihistamines such as Benadryl and monitor symptoms closely. If symptoms worsen or if you have severe symptoms including breathing issues, throat closure, significant swelling, whole body hives, severe diarrhea and vomiting, lightheadedness then inject epinephrine and seek immediate medical care afterwards.  Continue Singulair (montelukast) 10mg  once daily.  Continue Flonase (fluticasone) 1-2 spray per nostril daily.  May use Pataday (olopatadine) one drop per eye twice daily as needed for itchy/watery eyes.  Nasal saline spray (i.e., Simply Saline) or nasal saline lavage (i.e., NeilMed) is recommended as needed and prior to medicated nasal sprays.   Reactive airways dysfunction syndrome (HCC) Usually has issues with strong scents/parfumes.   Avoid as much as you are able.  Continue with the use of a mask.   Return in about 6 months (around 10/19/2019).  Meds ordered this encounter  Medications   montelukast (SINGULAIR) 10 MG tablet    Sig: Take 1 tablet (10 mg total) by mouth at bedtime.    Dispense:  90 tablet    Refill:  3    Patient needs office visit for additional refills.   fluticasone (FLONASE) 50 MCG/ACT nasal spray    Sig: Use 1-2 sprays per nostril for sinus issues.    Dispense:  48 g    Refill:  2   Olopatadine HCl 0.2 % SOLN    Sig: Apply 2 drops to eye daily as needed.    Dispense:  2.5 mL    Refill:  5   EPINEPHrine (AUVI-Q) 0.3 mg/0.3 mL IJ SOAJ injection    Sig: Inject 0.3 mLs (0.3 mg total) into the  muscle as needed for anaphylaxis.    Dispense:  2 each    Refill:  1   Diagnostics: None.   Medication List:  Current Outpatient Medications  Medication Sig Dispense Refill   acetaminophen (TYLENOL) 500 MG tablet Take 500-1,000 mg by mouth every 6 (six) hours as needed.     atorvastatin (LIPITOR) 80 MG  tablet TAKE 1 TABLET EVERY DAY 90 tablet 2   eletriptan (RELPAX) 40 MG tablet TAKE 1 TABLET BY MOUTH AS NEEDED FOR MIGRAINE OR HEADACHE. MAY REPEAT IN 2 HOURS IF NEEDED 9 tablet 1   escitalopram (LEXAPRO) 10 MG tablet TAKE 1 TABLET BY MOUTH EVERY DAY 90 tablet 1   estradiol (ESTRACE) 0.1 MG/GM vaginal cream PLACE 1 APPLICATORFUL VAGINALLY AT BEDTIME. FOR 2 WEEKS AND THEN 3 X PER WEEK OR PRN 42.5 g 5   fexofenadine (ALLEGRA) 180 MG tablet Take 180 mg by mouth daily.     fluticasone (FLONASE) 50 MCG/ACT nasal spray Use 1-2 sprays per nostril for sinus issues. 48 g 2   ibuprofen (ADVIL,MOTRIN) 200 MG tablet Take 400 mg by mouth every 6 (six) hours as needed.     montelukast (SINGULAIR) 10 MG tablet Take 1 tablet (10 mg total) by mouth at bedtime. 90 tablet 3   Olopatadine HCl 0.2 % SOLN Apply 2 drops to eye daily as needed. 2.5 mL 5   Vitamin D, Ergocalciferol, (DRISDOL) 1.25 MG (50000 UT) CAPS capsule TAKE 1 CAPSULE (50,000 UNITS TOTAL) BY MOUTH EVERY 7 (SEVEN) DAYS. 12 capsule 0   EPINEPHrine (AUVI-Q) 0.3 mg/0.3 mL IJ SOAJ injection Inject 0.3 mLs (0.3 mg total) into the muscle as needed for anaphylaxis. 2 each 1   No current facility-administered medications for this visit.    Allergies: Allergies  Allergen Reactions   Cetirizine Hcl     REACTION: flu symptoms   Codeine    Pravastatin Sodium     REACTION: Diarrhea   Propoxyphene N-Acetaminophen     REACTION: extreme nausea   Rhinocort [Budesonide]     Nose sores   Zetia [Ezetimibe]     Body aches, and "sick feeling"   I reviewed her past medical history, social history, family history, and environmental history and no significant changes have been reported from previous visit on 04/12/2018.  Review of Systems  Constitutional: Negative for appetite change, chills, fever and unexpected weight change.  HENT: Negative for congestion and rhinorrhea.   Eyes: Negative for itching.  Respiratory: Negative for cough, chest  tightness, shortness of breath and wheezing.   Gastrointestinal: Negative for abdominal pain.  Skin: Negative for rash.  Allergic/Immunologic: Positive for environmental allergies.  Neurological: Negative for headaches.   Objective: BP 108/60    Pulse 88    Temp 98 F (36.7 C) (Temporal)    Resp 18    Ht 5\' 4"  (1.626 m)    Wt 231 lb (104.8 kg)    SpO2 96%    BMI 39.65 kg/m  Body mass index is 39.65 kg/m. Physical Exam  Constitutional: She is oriented to person, place, and time. She appears well-developed and well-nourished.  HENT:  Head: Normocephalic and atraumatic.  Right Ear: External ear normal.  Left Ear: External ear normal.  Nose: Nose normal.  Mouth/Throat: Oropharynx is clear and moist.  Eyes: Conjunctivae and EOM are normal.  Neck: Neck supple.  Cardiovascular: Normal rate, regular rhythm and normal heart sounds. Exam reveals no gallop and no friction rub.  No murmur heard. Pulmonary/Chest: Effort normal and breath  sounds normal. She has no wheezes. She has no rales.  Neurological: She is alert and oriented to person, place, and time.  Skin: Skin is warm. No rash noted.  Psychiatric: She has a normal mood and affect. Her behavior is normal.  Nursing note and vitals reviewed.  Previous notes and tests were reviewed. The plan was reviewed with the patient/family, and all questions/concerned were addressed.  It was my pleasure to see Audrey Jenkins today and participate in her care. Please feel free to contact me with any questions or concerns.  Sincerely,  Rexene Alberts, DO Allergy & Immunology  [facility] office: [phone] [facility] office: [place] office: [phone]

## 2019-04-18 ENCOUNTER — Other Ambulatory Visit: Payer: Self-pay

## 2019-04-18 ENCOUNTER — Ambulatory Visit: Payer: BC Managed Care – PPO | Admitting: Allergy & Immunology

## 2019-04-18 ENCOUNTER — Encounter: Payer: Self-pay | Admitting: Allergy

## 2019-04-18 ENCOUNTER — Ambulatory Visit: Payer: BC Managed Care – PPO | Admitting: Allergy

## 2019-04-18 VITALS — BP 108/60 | HR 88 | Temp 98.0°F | Resp 18 | Ht 64.0 in | Wt 231.0 lb

## 2019-04-18 DIAGNOSIS — J302 Other seasonal allergic rhinitis: Secondary | ICD-10-CM

## 2019-04-18 DIAGNOSIS — J683 Other acute and subacute respiratory conditions due to chemicals, gases, fumes and vapors: Secondary | ICD-10-CM | POA: Diagnosis not present

## 2019-04-18 DIAGNOSIS — H1010 Acute atopic conjunctivitis, unspecified eye: Secondary | ICD-10-CM

## 2019-04-18 DIAGNOSIS — J3089 Other allergic rhinitis: Secondary | ICD-10-CM

## 2019-04-18 MED ORDER — FLUTICASONE PROPIONATE 50 MCG/ACT NA SUSP: NASAL | 2 refills | Status: DC

## 2019-04-18 MED ORDER — MONTELUKAST SODIUM 10 MG PO TABS: 10.0000 mg | ORAL_TABLET | Freq: Every day | ORAL | 3 refills | Status: DC

## 2019-04-18 MED ORDER — OLOPATADINE HCL 0.2 % OP SOLN: 2.0000 [drp] | Freq: Every day | OPHTHALMIC | 5 refills | Status: DC | PRN

## 2019-04-18 MED ORDER — EPINEPHRINE 0.3 MG/0.3ML IJ SOAJ: 0.3000 mg | INTRAMUSCULAR | 1 refills | Status: DC | PRN

## 2019-04-18 NOTE — Assessment & Plan Note (Signed)
Past history -2019 skin testing was positive to cat, grass, weed, tree, mold, dog, dust mites. Interim history - Doing much better with below regimen but interested in starting allergy injections.   Continue environmental control measures.  Start allergy injections.  Had a detailed discussion with patient/family that clinical history is suggestive of allergic rhinitis, and may benefit from allergy immunotherapy (AIT). Discussed in detail regarding the dosing, schedule, side effects (mild to moderate local allergic reaction and rarely systemic allergic reactions including anaphylaxis), and benefits (significant improvement in nasal symptoms, seasonal flares of asthma) of immunotherapy with the patient. There is significant time commitment involved with allergy shots, which includes weekly immunotherapy injections for first 9-12 months and then biweekly to monthly injections for 3-5 years.   Consent signed.  I have prescribed epinephrine injectable and demonstrated proper use. For mild symptoms you can take over the counter antihistamines such as Benadryl and monitor symptoms closely. If symptoms worsen or if you have severe symptoms including breathing issues, throat closure, significant swelling, whole body hives, severe diarrhea and vomiting, lightheadedness then inject epinephrine and seek immediate medical care afterwards.  Continue Singulair (montelukast) 10mg  once daily.  Continue Flonase (fluticasone) 1-2 spray per nostril daily.  May use Pataday (olopatadine) one drop per eye twice daily as needed for itchy/watery eyes.  Nasal saline spray (i.e., Simply Saline) or nasal saline lavage (i.e., NeilMed) is recommended as needed and prior to medicated nasal sprays.

## 2019-04-18 NOTE — Patient Instructions (Addendum)
1. Chronic rhinitis (trees, weeds, grasses, indoor molds, outdoor molds, dust mites, cat and dog)  Continue environmental control measures.  Start allergy injections.  Had a detailed discussion with patient/family that clinical history is suggestive of allergic rhinitis, and may benefit from allergy immunotherapy (AIT). Discussed in detail regarding the dosing, schedule, side effects (mild to moderate local allergic reaction and rarely systemic allergic reactions including anaphylaxis), and benefits (significant improvement in nasal symptoms, seasonal flares of asthma) of immunotherapy with the patient. There is significant time commitment involved with allergy shots, which includes weekly immunotherapy injections for first 9-12 months and then biweekly to monthly injections for 3-5 years.   Continue Singulair (montelukast) 10mg  once daily  Continue Flonase (fluticasone) 1-2 spray per nostril daily  May use Pataday (olopatadine) one drop per eye twice daily as needed for itchy/watery eyes-term control.  Nasal saline spray (i.e., Simply Saline) or nasal saline lavage (i.e., NeilMed) is recommended as needed and prior to medicated nasal sprays.   2. Sensitivity to scents/perfumes  Avoid as much as you are able.  Continue with the use of a mask.  Follow up in 6 months Get flu shot in the fall.   Reducing Pollen Exposure . Pollen seasons: trees (spring), grass (summer) and ragweed/weeds (fall). Marland Kitchen Keep windows closed in your home and car to lower pollen exposure.  Susa Simmonds air conditioning in the bedroom and throughout the house if possible.  . Avoid going out in dry windy days - especially early morning. . Pollen counts are highest between 5 - 10 AM and on dry, hot and windy days.  . Save outside activities for late afternoon or after a heavy rain, when pollen levels are lower.  . Avoid mowing of grass if you have grass pollen allergy. Marland Kitchen Be aware that pollen can also be transported  indoors on people and pets.  . Dry your clothes in an automatic dryer rather than hanging them outside where they might collect pollen.  . Rinse hair and eyes before bedtime. Control of House Dust Mite Allergen . Dust mite allergens are a common trigger of allergy and asthma symptoms. While they can be found throughout the house, these microscopic creatures thrive in warm, humid environments such as bedding, upholstered furniture and carpeting. . Because so much time is spent in the bedroom, it is essential to reduce mite levels there.  . Encase pillows, mattresses, and box springs in special allergen-proof fabric covers or airtight, zippered plastic covers.  . Bedding should be washed weekly in hot water (130 F) and dried in a hot dryer. Allergen-proof covers are available for comforters and pillows that can't be regularly washed.  Wendee Copp the allergy-proof covers every few months. Minimize clutter in the bedroom. Keep pets out of the bedroom.  Marland Kitchen Keep humidity less than 50% by using a dehumidifier or air conditioning. You can buy a humidity measuring device called a hygrometer to monitor this.  . If possible, replace carpets with hardwood, linoleum, or washable area rugs. If that's not possible, vacuum frequently with a vacuum that has a HEPA filter. . Remove all upholstered furniture and non-washable window drapes from the bedroom. . Remove all non-washable stuffed toys from the bedroom.  Wash stuffed toys weekly. Pet Allergen Avoidance: . Contrary to popular opinion, there are no "hypoallergenic" breeds of dogs or cats. That is because people are not allergic to an animal's hair, but to an allergen found in the animal's saliva, dander (dead skin flakes) or urine. Pet allergy  symptoms typically occur within minutes. For some people, symptoms can build up and become most severe 8 to 12 hours after contact with the animal. People with severe allergies can experience reactions in public places if dander  has been transported on the pet owners' clothing. Marland Kitchen Keeping an animal outdoors is only a partial solution, since homes with pets in the yard still have higher concentrations of animal allergens. . Before getting a pet, ask your allergist to determine if you are allergic to animals. If your pet is already considered part of your family, try to minimize contact and keep the pet out of the bedroom and other rooms where you spend a great deal of time. . As with dust mites, vacuum carpets often or replace carpet with a hardwood floor, tile or linoleum. . High-efficiency particulate air (HEPA) cleaners can reduce allergen levels over time. . While dander and saliva are the source of cat and dog allergens, urine is the source of allergens from rabbits, hamsters, mice and [place] pigs; so ask a non-allergic family member to clean the animal's cage. . If you have a pet allergy, talk to your allergist about the potential for allergy immunotherapy (allergy shots). This strategy can often provide long-term relief. Mold Control . Mold and fungi can grow on a variety of surfaces provided certain temperature and moisture conditions exist.  . Outdoor molds grow on plants, decaying vegetation and soil. The major outdoor mold, Alternaria and Cladosporium, are found in very high numbers during hot and dry conditions. Generally, a late summer - fall peak is seen for common outdoor fungal spores. Rain will temporarily lower outdoor mold spore count, but counts rise rapidly when the rainy period ends. . The most important indoor molds are Aspergillus and Penicillium. Dark, humid and poorly ventilated basements are ideal sites for mold growth. The next most common sites of mold growth are the bathroom and the kitchen. Outdoor (Seasonal) Mold Control . Use air conditioning and keep windows closed. . Avoid exposure to decaying vegetation. Marland Kitchen Avoid leaf raking. . Avoid grain handling. . Consider wearing a face mask if working  in moldy areas.  Indoor (Perennial) Mold Control  . Maintain humidity below 50%. . Get rid of mold growth on hard surfaces with water, detergent and, if necessary, 5% bleach (do not mix with other cleaners). Then dry the area completely. If mold covers an area more than 10 square feet, consider hiring an indoor environmental professional. . For clothing, washing with soap and water is best. If moldy items cannot be cleaned and dried, throw them away. . Remove sources e.g. contaminated carpets. . Repair and seal leaking roofs or pipes. Using dehumidifiers in damp basements may be helpful, but empty the water and clean units regularly to prevent mildew from forming. All rooms, especially basements, bathrooms and kitchens, require ventilation and cleaning to deter mold and mildew growth. Avoid carpeting on concrete or damp floors, and storing items in damp areas.

## 2019-04-18 NOTE — Assessment & Plan Note (Signed)
Usually has issues with strong scents/parfumes.   Avoid as much as you are able.  Continue with the use of a mask.

## 2019-04-21 NOTE — Progress Notes (Signed)
VIALS EXP 04-20-20

## 2019-04-22 ENCOUNTER — Other Ambulatory Visit: Payer: Self-pay

## 2019-04-22 ENCOUNTER — Encounter: Payer: Self-pay | Admitting: Gastroenterology

## 2019-04-22 ENCOUNTER — Ambulatory Visit: Payer: BC Managed Care – PPO | Admitting: Gastroenterology

## 2019-04-22 VITALS — BP 114/80 | HR 80 | Temp 98.2°F | Ht 64.0 in | Wt 236.0 lb

## 2019-04-22 DIAGNOSIS — R748 Abnormal levels of other serum enzymes: Secondary | ICD-10-CM

## 2019-04-22 NOTE — Progress Notes (Addendum)
Referring Provider: Burnis Medin, MD Primary Care Physician:  Burnis Medin, MD  Reason for Consultation: Elevated alkaline phosphatase and GGT   IMPRESSION:  Mildly elevated alkaline phosphatase without associated symptoms Mildly elevated GGT Hepatic steatosis on ultrasound 09/06/09 Hyperlipidemia with initial lipid level in the 400s Elevated ESR at 46 01/17/2019, normal CRP less than 1 Vitamin D deficiency Normal screening colonoscopy with Dr. Olevia Perches 07/27/2009 Family history of fatty liver (sister)  Etiology and clinical significance of mildly elevated alkaline phosphatase and concurrent elevation in GGT is unclear.  Alkaline phosphatase is usually 1.5 times the upper limit of normal in the setting of primary biliary cholangitis. Must consider elevation due to fatty liver.   We briefly discussed the possibility of fatty liver.  She expressed difficulty with weight loss since menopause.  I suggested that she consider consultation with the [place] [facility].  Her last colonoscopy was 10 years ago.  She is due a screening colonoscopy this year.  We will schedule at her convenience.  PLAN: Hepatic function panel, alk phos isoenzymes, IgM, AMA, 5'-nucleotidase, fasting insulin, fasting glucose  Abdominal ultrasound Follow-up in 3-4 weeks after the ultrasound Screening colonoscopy at the patient's convenience Consider referral to Dr. Leafy Ro with [facility]  Please see the "Patient Instructions" section for addition details about the plan.  HPI: Audrey Jenkins is a 63 y.o. female referred by Dr. Regis Bill for further evaluation of an elevated alkaline phosphatase and GGT. She taught business and Conservation officer, nature at [employer]. Currently working with [employer].  The history is obtained through the patient and review of her electronic health record. 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   Review of EPIC shows an abnormal alkaline phosphatase dating back to 2010.  It ranges from a upper limit of normal of 10 5-140.  The most recent alk phos was 124 01/17/2019.  Her ALT has been intermittently mildly elevated.  AST is consistently normal.  In addition her GGT has been checked for times over the last 10 years and on each time was mildly elevated.  Most recently was 2 with an upper limit of normal at 51.  Abdominal ultrasound 09/06/2009: Hepatic steatosis 11/25/2010: AMA normal at 0.54 01/17/2019: CRP less than 1, vitamin D 23.74, triglycerides 150, platelets 266, sedimentation rate 46 03/18/2018:  ANA positive in a 1-80 homogenous pattern. 07/05/2018: AMA less than 20  There is no history of skin hyperpigmentation, pruritus, severe fatigue, positive antimitochondrial antibody, other autoimmune diseases.   She had a normal screening colonoscopy with Dr. Olevia Perches 27-Jul-2009.   Tearful about the death of her 82 year old niece last year to opioids.   Mother with uterine cancer. Maternal grandfather with stomach cancer. There is no known family history of autoimmune disease. No other known family history of colon cancer or polyps. No family history of uterine/endometrial cancer, pancreatic cancer or gastric/stomach cancer.   Past Medical History:  Diagnosis Date  . Abnormal LFTs   . Allergy   . Depression   . Fatty liver    on [place] Jan 2011  . Headache(784.0)   . Hyperlipidemia    Inital 400's  . Mood disorder ([place])   . Osteoarthritis   . Primiparous   . Snoring    ENT [facility] Jan 2011 nasal obstruction and  cryptic tonsils    Past Surgical History:  Procedure Laterality Date  . BREAST SURGERY  1995   bx  . dental implant Left 11/2016    Current Outpatient Medications  Medication Sig Dispense Refill  . acetaminophen  (TYLENOL) 500 MG tablet Take 500-1,000 mg by mouth every 6 (six) hours as needed.    Marland Kitchen atorvastatin (LIPITOR) 80 MG tablet TAKE 1 TABLET EVERY DAY 90 tablet 2  . eletriptan (RELPAX) 40 MG tablet TAKE 1 TABLET BY MOUTH AS NEEDED FOR MIGRAINE OR HEADACHE. MAY REPEAT IN 2 HOURS IF NEEDED 9 tablet 1  . EPINEPHrine (AUVI-Q) 0.3 mg/0.3 mL IJ SOAJ injection Inject 0.3 mLs (0.3 mg total) into the muscle as needed for anaphylaxis. 2 each 1  . escitalopram (LEXAPRO) 10 MG tablet TAKE 1 TABLET BY MOUTH EVERY DAY 90 tablet 1  . estradiol (ESTRACE) 0.1 MG/GM vaginal cream PLACE 1 APPLICATORFUL VAGINALLY AT BEDTIME. FOR 2 WEEKS AND THEN 3 X PER WEEK OR PRN 42.5 g 5  . fexofenadine (ALLEGRA) 180 MG tablet Take 180 mg by mouth daily.    . fluticasone (FLONASE) 50 MCG/ACT nasal spray Use 1-2 sprays per nostril for sinus issues. 48 g 2  . ibuprofen (ADVIL,MOTRIN) 200 MG tablet Take 400 mg by mouth every 6 (six) hours as needed.    . montelukast (SINGULAIR) 10 MG tablet Take 1 tablet (10 mg total) by mouth at bedtime. 90 tablet 3  . Olopatadine HCl 0.2 % SOLN Apply 2 drops to eye daily as needed. 2.5 mL 5  . Vitamin D, Ergocalciferol, (DRISDOL) 1.25 MG (50000 UT) CAPS capsule TAKE 1 CAPSULE (50,000 UNITS TOTAL) BY MOUTH EVERY 7 (SEVEN) DAYS. 12 capsule 0   No current facility-administered medications for this visit.     Allergies as of 04/22/2019 - Review Complete 04/18/2019  Allergen Reaction Noted  . Cetirizine hcl  05/22/2008  . Codeine  04/26/2018  . Pravastatin sodium  07/15/2009  . Propoxyphene n-acetaminophen  05/22/2008  . Rhinocort [budesonide]  01/04/2018  . Zetia [ezetimibe]  10/08/2017    Family History  Problem Relation Age of Onset  . Arthritis Mother   . COPD Mother   . Dementia Mother   . Endometrial cancer Mother   . Skin cancer Mother   . Osteoporosis Mother   . Other Mother        dementia  . Heart attack Father   . Hypertension Father        cabd Mi 69 died CHF  . Heart  failure Father   . Heart disease Father   . Sleep apnea Sister        improved with weight loss  . Arthritis Sister   . Allergic rhinitis Sister   . Coronary artery disease Sister   . Vasculitis Sister   . Allergic rhinitis Sister   . Angioedema Neg Hx   . Asthma Neg Hx   . Eczema Neg Hx   . Immunodeficiency Neg Hx   . Urticaria Neg Hx     Social History   Socioeconomic History  . Marital status: Married    Spouse name: Not on file  . Number of children: Not on file  . Years of education: Not on file  . Highest education level: Not on file  Occupational History  . Not on file  Social Needs  . Financial resource strain: Not on file  . Food insecurity    Worry: Not on file    Inability: Not on file  .  Transportation needs    Medical: Not on file    Non-medical: Not on file  Tobacco Use  . Smoking status: Never Smoker  . Smokeless tobacco: Never Used  Substance and Sexual Activity  . Alcohol use: Yes    Alcohol/week: 0.0 standard drinks    Comment: socially  . Drug use: No  . Sexual activity: Not on file  Lifestyle  . Physical activity    Days per week: Not on file    Minutes per session: Not on file  . Stress: Not on file  Relationships  . Social Herbalist on phone: Not on file    Gets together: Not on file    Attends religious service: Not on file    Active member of club or organization: Not on file    Attends meetings of clubs or organizations: Not on file    Relationship status: Not on file  . Intimate partner violence    Fear of current or ex partner: Not on file    Emotionally abused: Not on file    Physically abused: Not on file    Forced sexual activity: Not on file  Other Topics Concern  . Not on file  Social History Narrative   Married   HH of 2   Pet Kittens   Works with Geophysicist/field seismologist    employed [employer]  Teaches  Full time    6 Energy manager some older    Neg tobacco ets rare etoh          Review  of Systems: 12 system ROS is negative except as noted above with allergies presenting with coughing, postmenopausal dry skin. Starting allergy shots later this year.    Physical Exam: General:   Alert,  well-nourished, pleasant and cooperative in NAD Head:  Normocephalic and atraumatic. Eyes:  Sclera clear, no icterus.   Conjunctiva pink. No xanthesma or xantheloma.  Ears:  Normal auditory acuity. Nose:  No deformity, discharge,  or lesions. Mouth:  No deformity or lesions.   Neck:  Supple; no masses or thyromegaly. Lungs:  Clear throughout to auscultation.   Coughing during her procedure. No wheezes. Heart:  Regular rate and rhythm; no murmurs. Abdomen:  Soft, central obesity. nontender, nondistended, normal bowel sounds, no rebound or guarding. No hepatosplenomegaly.   Rectal:  Deferred  Msk:  Symmetrical. No boney deformities LAD: No inguinal or umbilical LAD Extremities:  No clubbing or edema. Neurologic:  Alert and  oriented x4;  grossly nonfocal Skin:  Intact without significant lesions or rashes. Psych:  Alert and cooperative. Normal mood and affect.     Alise Calais L. Tarri Glenn, MD, MPH 04/22/2019, 11:47 AM

## 2019-04-22 NOTE — Patient Instructions (Addendum)
I have recommended some labs and an ultrasound to further evaluate your elevate alkaline phosphatase. Press "B" on the elevator. The lab is located at the first door on the left as you exit the elevator.   Let's plan to follow-up in 3-4 weeks to review these results and determine if additional testing or treatment is needed.  You should have another colonoscopy this year. Please schedule one at your convenience.   You have been scheduled for an abdominal ultrasound at [facility] [address] Rad[address] (1st floor of hospital) on 04-25-2019 at 9:30 am. Please arrive 15 minutes prior to your appointment for registration. Make certain not to have anything to eat or drink 6 hours prior to your appointment. Should you need to reschedule your appointment, please contact radiology at [phone]. This test typically takes about 30 minutes to perform.

## 2019-04-23 DIAGNOSIS — J301 Allergic rhinitis due to pollen: Secondary | ICD-10-CM | POA: Diagnosis not present

## 2019-04-24 DIAGNOSIS — J3089 Other allergic rhinitis: Secondary | ICD-10-CM | POA: Diagnosis not present

## 2019-04-25 ENCOUNTER — Other Ambulatory Visit (INDEPENDENT_AMBULATORY_CARE_PROVIDER_SITE_OTHER): Payer: BC Managed Care – PPO

## 2019-04-25 ENCOUNTER — Other Ambulatory Visit: Payer: Self-pay

## 2019-04-25 ENCOUNTER — Ambulatory Visit (HOSPITAL_COMMUNITY)
Admission: RE | Admit: 2019-04-25 | Discharge: 2019-04-25 | Disposition: A | Discharge status: Home / Self Care | Payer: BC Managed Care – PPO | Source: Ambulatory Visit | Attending: Gastroenterology | Admitting: Gastroenterology

## 2019-04-25 DIAGNOSIS — R748 Abnormal levels of other serum enzymes: Secondary | ICD-10-CM | POA: Diagnosis not present

## 2019-04-25 LAB — HEPATIC FUNCTION PANEL
ALT: 32 U/L (ref 0–35)
AST: 28 U/L (ref 0–37)
Albumin: 4.1 g/dL (ref 3.5–5.2)
Alkaline Phosphatase: 120 U/L — ABNORMAL HIGH (ref 39–117)
Bilirubin, Direct: 0.1 mg/dL (ref 0.0–0.3)
Total Bilirubin: 0.5 mg/dL (ref 0.2–1.2)
Total Protein: 7.2 g/dL (ref 6.0–8.3)

## 2019-04-25 LAB — ALKALINE PHOSPHATASE: Alkaline Phosphatase: 119 U/L — ABNORMAL HIGH (ref 39–117)

## 2019-04-25 LAB — GLUCOSE, RANDOM: Glucose, Bld: 90 mg/dL (ref 70–99)

## 2019-05-02 LAB — INSULIN, FREE AND TOTAL
Free Insulin: 22 uU/mL — ABNORMAL HIGH
Total Insulin: 22 uU/mL

## 2019-05-02 LAB — IGM: IgM, Serum: 148 mg/dL (ref 50–300)

## 2019-05-02 LAB — MITOCHONDRIAL ANTIBODIES: Mitochondrial M2 Ab, IgG: <=20 U

## 2019-05-02 LAB — NUCLEOTIDASE, 5', BLOOD: 5-Nucleotidase: 5 U/L (ref 0–10)

## 2019-05-08 ENCOUNTER — Encounter: Payer: Self-pay | Admitting: *Deleted

## 2019-05-12 ENCOUNTER — Encounter: Payer: Self-pay | Admitting: *Deleted

## 2019-05-15 ENCOUNTER — Ambulatory Visit: Payer: BC Managed Care – PPO

## 2019-05-15 ENCOUNTER — Ambulatory Visit (INDEPENDENT_AMBULATORY_CARE_PROVIDER_SITE_OTHER): Payer: BC Managed Care – PPO

## 2019-05-15 DIAGNOSIS — J309 Allergic rhinitis, unspecified: Secondary | ICD-10-CM

## 2019-05-15 NOTE — Progress Notes (Signed)
Immunotherapy   Patient Details  Name: Audrey Jenkins MRN: [id number] Date of Birth: [birth year]  05/15/2019  [place] pt M-DM & G-W-T-C-D Following schedule: A Frequency:Weekily Epi-Pen: Yes  Consent signed and patient instructions given.   Audrey Jenkins 05/15/2019, 4:43 PM

## 2019-05-22 ENCOUNTER — Ambulatory Visit (INDEPENDENT_AMBULATORY_CARE_PROVIDER_SITE_OTHER): Payer: BC Managed Care – PPO

## 2019-05-22 DIAGNOSIS — J309 Allergic rhinitis, unspecified: Secondary | ICD-10-CM | POA: Diagnosis not present

## 2019-05-23 ENCOUNTER — Ambulatory Visit: Payer: BC Managed Care – PPO | Admitting: Gastroenterology

## 2019-06-04 ENCOUNTER — Ambulatory Visit (INDEPENDENT_AMBULATORY_CARE_PROVIDER_SITE_OTHER): Payer: BC Managed Care – PPO

## 2019-06-04 DIAGNOSIS — J309 Allergic rhinitis, unspecified: Secondary | ICD-10-CM

## 2019-06-05 ENCOUNTER — Other Ambulatory Visit: Payer: Self-pay | Admitting: Internal Medicine

## 2019-06-13 ENCOUNTER — Encounter: Payer: Self-pay | Admitting: Gastroenterology

## 2019-06-13 ENCOUNTER — Ambulatory Visit (INDEPENDENT_AMBULATORY_CARE_PROVIDER_SITE_OTHER): Payer: BC Managed Care – PPO

## 2019-06-13 DIAGNOSIS — J309 Allergic rhinitis, unspecified: Secondary | ICD-10-CM | POA: Diagnosis not present

## 2019-06-18 ENCOUNTER — Ambulatory Visit (INDEPENDENT_AMBULATORY_CARE_PROVIDER_SITE_OTHER): Payer: BC Managed Care – PPO

## 2019-06-18 DIAGNOSIS — J309 Allergic rhinitis, unspecified: Secondary | ICD-10-CM | POA: Diagnosis not present

## 2019-06-24 ENCOUNTER — Other Ambulatory Visit: Payer: Self-pay | Admitting: Internal Medicine

## 2019-06-25 ENCOUNTER — Ambulatory Visit (INDEPENDENT_AMBULATORY_CARE_PROVIDER_SITE_OTHER): Payer: BC Managed Care – PPO

## 2019-06-25 DIAGNOSIS — J309 Allergic rhinitis, unspecified: Secondary | ICD-10-CM

## 2019-07-02 ENCOUNTER — Ambulatory Visit (INDEPENDENT_AMBULATORY_CARE_PROVIDER_SITE_OTHER): Payer: BC Managed Care – PPO

## 2019-07-02 DIAGNOSIS — J309 Allergic rhinitis, unspecified: Secondary | ICD-10-CM | POA: Diagnosis not present

## 2019-07-11 ENCOUNTER — Ambulatory Visit (INDEPENDENT_AMBULATORY_CARE_PROVIDER_SITE_OTHER): Payer: BC Managed Care – PPO

## 2019-07-11 DIAGNOSIS — J309 Allergic rhinitis, unspecified: Secondary | ICD-10-CM

## 2019-07-14 ENCOUNTER — Ambulatory Visit: Payer: Self-pay

## 2019-07-16 ENCOUNTER — Ambulatory Visit (INDEPENDENT_AMBULATORY_CARE_PROVIDER_SITE_OTHER): Payer: BC Managed Care – PPO

## 2019-07-16 DIAGNOSIS — J309 Allergic rhinitis, unspecified: Secondary | ICD-10-CM | POA: Diagnosis not present

## 2019-07-23 ENCOUNTER — Ambulatory Visit (INDEPENDENT_AMBULATORY_CARE_PROVIDER_SITE_OTHER): Payer: BC Managed Care – PPO

## 2019-07-23 DIAGNOSIS — J309 Allergic rhinitis, unspecified: Secondary | ICD-10-CM | POA: Diagnosis not present

## 2019-07-31 ENCOUNTER — Ambulatory Visit (INDEPENDENT_AMBULATORY_CARE_PROVIDER_SITE_OTHER): Payer: Self-pay

## 2019-07-31 DIAGNOSIS — J309 Allergic rhinitis, unspecified: Secondary | ICD-10-CM

## 2019-08-06 ENCOUNTER — Other Ambulatory Visit: Payer: Self-pay | Admitting: Internal Medicine

## 2019-08-07 ENCOUNTER — Other Ambulatory Visit: Payer: Self-pay | Admitting: Internal Medicine

## 2019-08-12 ENCOUNTER — Telehealth: Payer: Self-pay | Admitting: Allergy

## 2019-08-12 NOTE — Telephone Encounter (Signed)
Opened telephone contact in error.

## 2019-08-13 ENCOUNTER — Ambulatory Visit (INDEPENDENT_AMBULATORY_CARE_PROVIDER_SITE_OTHER): Payer: Self-pay

## 2019-08-13 DIAGNOSIS — J309 Allergic rhinitis, unspecified: Secondary | ICD-10-CM

## 2019-08-20 ENCOUNTER — Ambulatory Visit (INDEPENDENT_AMBULATORY_CARE_PROVIDER_SITE_OTHER): Payer: Self-pay

## 2019-08-20 DIAGNOSIS — J309 Allergic rhinitis, unspecified: Secondary | ICD-10-CM

## 2019-08-26 ENCOUNTER — Ambulatory Visit (INDEPENDENT_AMBULATORY_CARE_PROVIDER_SITE_OTHER): Payer: Self-pay

## 2019-08-26 DIAGNOSIS — J309 Allergic rhinitis, unspecified: Secondary | ICD-10-CM

## 2019-09-04 ENCOUNTER — Ambulatory Visit (INDEPENDENT_AMBULATORY_CARE_PROVIDER_SITE_OTHER): Payer: Self-pay

## 2019-09-04 DIAGNOSIS — J309 Allergic rhinitis, unspecified: Secondary | ICD-10-CM

## 2019-09-10 ENCOUNTER — Ambulatory Visit (INDEPENDENT_AMBULATORY_CARE_PROVIDER_SITE_OTHER): Payer: Self-pay

## 2019-09-10 DIAGNOSIS — J309 Allergic rhinitis, unspecified: Secondary | ICD-10-CM

## 2019-09-18 ENCOUNTER — Ambulatory Visit (INDEPENDENT_AMBULATORY_CARE_PROVIDER_SITE_OTHER): Payer: Self-pay

## 2019-09-18 DIAGNOSIS — J309 Allergic rhinitis, unspecified: Secondary | ICD-10-CM

## 2019-09-25 ENCOUNTER — Ambulatory Visit (INDEPENDENT_AMBULATORY_CARE_PROVIDER_SITE_OTHER): Payer: Self-pay

## 2019-09-25 DIAGNOSIS — J309 Allergic rhinitis, unspecified: Secondary | ICD-10-CM

## 2019-10-02 ENCOUNTER — Ambulatory Visit (INDEPENDENT_AMBULATORY_CARE_PROVIDER_SITE_OTHER): Payer: Self-pay

## 2019-10-02 DIAGNOSIS — J309 Allergic rhinitis, unspecified: Secondary | ICD-10-CM

## 2019-10-08 ENCOUNTER — Ambulatory Visit (INDEPENDENT_AMBULATORY_CARE_PROVIDER_SITE_OTHER): Payer: Self-pay

## 2019-10-08 DIAGNOSIS — J309 Allergic rhinitis, unspecified: Secondary | ICD-10-CM

## 2019-10-15 ENCOUNTER — Ambulatory Visit (INDEPENDENT_AMBULATORY_CARE_PROVIDER_SITE_OTHER): Payer: Self-pay | Admitting: *Deleted

## 2019-10-15 DIAGNOSIS — J309 Allergic rhinitis, unspecified: Secondary | ICD-10-CM

## 2019-10-23 ENCOUNTER — Ambulatory Visit (INDEPENDENT_AMBULATORY_CARE_PROVIDER_SITE_OTHER): Payer: Self-pay | Admitting: *Deleted

## 2019-10-23 DIAGNOSIS — J309 Allergic rhinitis, unspecified: Secondary | ICD-10-CM

## 2019-10-24 ENCOUNTER — Ambulatory Visit: Payer: BC Managed Care – PPO | Admitting: Allergy

## 2019-10-30 NOTE — Progress Notes (Signed)
Follow Up Note  RE: Audrey Jenkins MRN: [id number] DOB: 06-12-1956 Date of Office Visit: 10/31/2019  Referring provider: Burnis Medin, MD Primary care provider: Burnis Medin, MD  Chief Complaint: Allergies  History of Present Illness: I had the pleasure of seeing Audrey Jenkins for a follow up visit at the [facility] on 10/31/2019. She is a 64 y.o. female, who is being followed for allergic rhinoconjunctivitis and reactive airway disease. Her previous allergy office visit was on 04/18/2019 with Dr. Maudie Mercury. Today is a regular follow up visit.  Seasonal and perennial allergic rhinoconjunctivitis  Patient is receiving allergy injections weekly and tolerating it well with no issues. She believes they are already helping her.  Currently on  Singulair, allegra, Flonase 2 sprays once a daily with no nosebleeds and using eye drops as needed. Had eyes checked last summer.  She broke her tibia in the fall but now doing better.   Assessment and Plan: Audrey Jenkins is a 64 y.o. female with: Seasonal and perennial allergic rhinoconjunctivitis Past history -2019 skin testing was positive to cat, grass, weed, tree, mold, dog, dust mites. Interim history - started AIT on 05/15/2019 (M-DM & G-W-T-C-D) and tolerating it well with no issues. Noticing some improvement already.   Continue environmental control measures.  Continue allergy injections.  Continue Singulair (montelukast) 10mg  once daily.  Continue Flonase (fluticasone) 1-2 spray per nostril daily.  May use Pataday (olopatadine 0.2%) one drop per eye once daily as needed for itchy/watery eyes.  Nasal saline spray (i.e., Simply Saline) or nasal saline lavage (i.e., NeilMed) is recommended as needed and prior to medicated nasal sprays.   May use over the counter antihistamines such as Allegra (fexofenadine) daily as needed.  Recommend that you get the COVID-19 vaccine when available to you. Just make sure you have at least 48  hours between the vaccine and your allergy shot.  Return in about 6 months (around 05/02/2020).  Diagnostics: None.  Medication List:  Current Outpatient Medications  Medication Sig Dispense Refill  . acetaminophen (TYLENOL) 500 MG tablet Take 500-1,000 mg by mouth every 6 (six) hours as needed.    Marland Kitchen atorvastatin (LIPITOR) 80 MG tablet TAKE 1 TABLET EVERY DAY 90 tablet 1  . eletriptan (RELPAX) 40 MG tablet TAKE 1 TABLET BY MOUTH AS NEEDED FOR MIGRAINE OR HEADACHE. MAY REPEAT IN 2 HOURS IF NEEDED 9 tablet 1  . EPINEPHrine (AUVI-Q) 0.3 mg/0.3 mL IJ SOAJ injection Inject 0.3 mLs (0.3 mg total) into the muscle as needed for anaphylaxis. 2 each 1  . escitalopram (LEXAPRO) 10 MG tablet Take 1 tablet (10 mg total) by mouth daily. Needs virtual visit 90 tablet 0  . estradiol (ESTRACE) 0.1 MG/GM vaginal cream PLACE 1 APPLICATORFUL VAGINALLY AT BEDTIME. FOR 2 WEEKS AND THEN 3 X PER WEEK OR PRN 42.5 g 5  . fexofenadine (ALLEGRA) 180 MG tablet Take 180 mg by mouth daily.    . fluticasone (FLONASE) 50 MCG/ACT nasal spray Use 1-2 sprays per nostril for sinus issues. 48 g 2  . montelukast (SINGULAIR) 10 MG tablet Take 1 tablet (10 mg total) by mouth at bedtime. 90 tablet 3  . Olopatadine HCl 0.2 % SOLN Apply 2 drops to eye daily as needed. 2.5 mL 5  . Vitamin D, Ergocalciferol, (DRISDOL) 1.25 MG (50000 UT) CAPS capsule TAKE 1 CAPSULE (50,000 UNITS TOTAL) BY MOUTH EVERY 7 (SEVEN) DAYS. 12 capsule 0   No current facility-administered medications for this visit.  Allergies: Allergies  Allergen Reactions  . Cetirizine Hcl     REACTION: flu symptoms  . Codeine   . Pravastatin Sodium     REACTION: Diarrhea  . Propoxyphene N-Acetaminophen     REACTION: extreme nausea  . Rhinocort [Budesonide]     Nose sores  . Zetia [Ezetimibe]     Body aches, and "sick feeling"   I reviewed her past medical history, social history, family history, and environmental history and no significant changes have been  reported from her previous visit.  Review of Systems  Constitutional: Negative for appetite change, chills, fever and unexpected weight change.  HENT: Negative for congestion and rhinorrhea.   Eyes: Positive for itching.  Respiratory: Negative for cough, chest tightness, shortness of breath and wheezing.   Gastrointestinal: Negative for abdominal pain.  Skin: Negative for rash.  Allergic/Immunologic: Positive for environmental allergies.  Neurological: Negative for headaches.   Objective: BP 122/74   Pulse 81   Temp 98 F (36.7 C) (Temporal)   Resp 20   Ht 5\' 4"  (1.626 m)   Wt 235 lb (106.6 kg)   SpO2 96%   BMI 40.34 kg/m  Body mass index is 40.34 kg/m. Physical Exam  Constitutional: She is oriented to person, place, and time. She appears well-developed and well-nourished.  HENT:  Head: Normocephalic and atraumatic.  Right Ear: External ear normal.  Left Ear: External ear normal.  Nose: Nose normal.  Mouth/Throat: Oropharynx is clear and moist.  Eyes: Conjunctivae and EOM are normal.  Cardiovascular: Normal rate, regular rhythm and normal heart sounds. Exam reveals no gallop and no friction rub.  No murmur heard. Pulmonary/Chest: Effort normal and breath sounds normal. She has no wheezes. She has no rales.  Musculoskeletal:     Cervical back: Neck supple.  Neurological: She is alert and oriented to person, place, and time.  Skin: Skin is warm. No rash noted.  Psychiatric: She has a normal mood and affect. Her behavior is normal.  Nursing note and vitals reviewed.  Previous notes and tests were reviewed. The plan was reviewed with the patient/family, and all questions/concerned were addressed.  It was my pleasure to see Audrey Jenkins today and participate in her care. Please feel free to contact me with any questions or concerns.  Sincerely,  Rexene Alberts, DO Allergy & Immunology  [facility] office: [phone] [facility] office:  [place] office: [phone]

## 2019-10-31 ENCOUNTER — Other Ambulatory Visit: Payer: Self-pay

## 2019-10-31 ENCOUNTER — Encounter: Payer: Self-pay | Admitting: Allergy

## 2019-10-31 ENCOUNTER — Other Ambulatory Visit: Payer: Self-pay | Admitting: Internal Medicine

## 2019-10-31 ENCOUNTER — Ambulatory Visit: Payer: Self-pay

## 2019-10-31 ENCOUNTER — Ambulatory Visit: Payer: BC Managed Care – PPO | Admitting: Allergy

## 2019-10-31 VITALS — BP 122/74 | HR 81 | Temp 98.0°F | Resp 20 | Ht 64.0 in | Wt 235.0 lb

## 2019-10-31 DIAGNOSIS — J309 Allergic rhinitis, unspecified: Secondary | ICD-10-CM

## 2019-10-31 DIAGNOSIS — H1010 Acute atopic conjunctivitis, unspecified eye: Secondary | ICD-10-CM | POA: Diagnosis not present

## 2019-10-31 DIAGNOSIS — J3089 Other allergic rhinitis: Secondary | ICD-10-CM

## 2019-10-31 DIAGNOSIS — J302 Other seasonal allergic rhinitis: Secondary | ICD-10-CM | POA: Diagnosis not present

## 2019-10-31 NOTE — Patient Instructions (Addendum)
Seasonal and perennial allergic rhinoconjunctivitis Past history -2019 skin testing was positive to cat, grass, weed, tree, mold, dog, dust mites.  Continue environmental control measures.  Continue allergy injections.  Continue Singulair (montelukast) 10mg  once daily.  Continue Flonase (fluticasone) 1-2 spray per nostril daily.  May use Pataday (olopatadine 0.2%) one drop per eye once daily as needed for itchy/watery eyes.  Nasal saline spray (i.e., Simply Saline) or nasal saline lavage (i.e., NeilMed) is recommended as needed and prior to medicated nasal sprays.   May use over the counter antihistamines such as Allegra (fexofenadine) daily as needed.  Recommend that you get the COVID-19 vaccine when available to you. Just make sure you have at least 48 hours between the vaccine and your allergy shot.  Follow up in 6 months or sooner if needed.   Reducing Pollen Exposure . Pollen seasons: trees (spring), grass (summer) and ragweed/weeds (fall). Marland Kitchen Keep windows closed in your home and car to lower pollen exposure.  Susa Simmonds air conditioning in the bedroom and throughout the house if possible.  . Avoid going out in dry windy days - especially early morning. . Pollen counts are highest between 5 - 10 AM and on dry, hot and windy days.  . Save outside activities for late afternoon or after a heavy rain, when pollen levels are lower.  . Avoid mowing of grass if you have grass pollen allergy. Marland Kitchen Be aware that pollen can also be transported indoors on people and pets.  . Dry your clothes in an automatic dryer rather than hanging them outside where they might collect pollen.  . Rinse hair and eyes before bedtime. Pet Allergen Avoidance: . Contrary to popular opinion, there are no "hypoallergenic" breeds of dogs or cats. That is because people are not allergic to an animal's hair, but to an allergen found in the animal's saliva, dander (dead skin flakes) or urine. Pet allergy symptoms  typically occur within minutes. For some people, symptoms can build up and become most severe 8 to 12 hours after contact with the animal. People with severe allergies can experience reactions in public places if dander has been transported on the pet owners' clothing. Marland Kitchen Keeping an animal outdoors is only a partial solution, since homes with pets in the yard still have higher concentrations of animal allergens. . Before getting a pet, ask your allergist to determine if you are allergic to animals. If your pet is already considered part of your family, try to minimize contact and keep the pet out of the bedroom and other rooms where you spend a great deal of time. . As with dust mites, vacuum carpets often or replace carpet with a hardwood floor, tile or linoleum. . High-efficiency particulate air (HEPA) cleaners can reduce allergen levels over time. . While dander and saliva are the source of cat and dog allergens, urine is the source of allergens from rabbits, hamsters, mice and [place] pigs; so ask a non-allergic family member to clean the animal's cage. . If you have a pet allergy, talk to your allergist about the potential for allergy immunotherapy (allergy shots). This strategy can often provide long-term relief. Control of House Dust Mite Allergen . Dust mite allergens are a common trigger of allergy and asthma symptoms. While they can be found throughout the house, these microscopic creatures thrive in warm, humid environments such as bedding, upholstered furniture and carpeting. . Because so much time is spent in the bedroom, it is essential to reduce mite levels there.  Marland Kitchen  Encase pillows, mattresses, and box springs in special allergen-proof fabric covers or airtight, zippered plastic covers.  . Bedding should be washed weekly in hot water (130 F) and dried in a hot dryer. Allergen-proof covers are available for comforters and pillows that can't be regularly washed.  Wendee Copp the allergy-proof  covers every few months. Minimize clutter in the bedroom. Keep pets out of the bedroom.  Marland Kitchen Keep humidity less than 50% by using a dehumidifier or air conditioning. You can buy a humidity measuring device called a hygrometer to monitor this.  . If possible, replace carpets with hardwood, linoleum, or washable area rugs. If that's not possible, vacuum frequently with a vacuum that has a HEPA filter. . Remove all upholstered furniture and non-washable window drapes from the bedroom. . Remove all non-washable stuffed toys from the bedroom.  Wash stuffed toys weekly. Mold Control . Mold and fungi can grow on a variety of surfaces provided certain temperature and moisture conditions exist.  . Outdoor molds grow on plants, decaying vegetation and soil. The major outdoor mold, Alternaria and Cladosporium, are found in very high numbers during hot and dry conditions. Generally, a late summer - fall peak is seen for common outdoor fungal spores. Rain will temporarily lower outdoor mold spore count, but counts rise rapidly when the rainy period ends. . The most important indoor molds are Aspergillus and Penicillium. Dark, humid and poorly ventilated basements are ideal sites for mold growth. The next most common sites of mold growth are the bathroom and the kitchen. Outdoor (Seasonal) Mold Control . Use air conditioning and keep windows closed. . Avoid exposure to decaying vegetation. Marland Kitchen Avoid leaf raking. . Avoid grain handling. . Consider wearing a face mask if working in moldy areas.  Indoor (Perennial) Mold Control  . Maintain humidity below 50%. . Get rid of mold growth on hard surfaces with water, detergent and, if necessary, 5% bleach (do not mix with other cleaners). Then dry the area completely. If mold covers an area more than 10 square feet, consider hiring an indoor environmental professional. . For clothing, washing with soap and water is best. If moldy items cannot be cleaned and dried, throw  them away. . Remove sources e.g. contaminated carpets. . Repair and seal leaking roofs or pipes. Using dehumidifiers in damp basements may be helpful, but empty the water and clean units regularly to prevent mildew from forming. All rooms, especially basements, bathrooms and kitchens, require ventilation and cleaning to deter mold and mildew growth. Avoid carpeting on concrete or damp floors, and storing items in damp areas.

## 2019-10-31 NOTE — Assessment & Plan Note (Signed)
Past history -2019 skin testing was positive to cat, grass, weed, tree, mold, dog, dust mites. Interim history - started AIT on 05/15/2019 (M-DM & G-W-T-C-D) and tolerating it well with no issues. Noticing some improvement already.   Continue environmental control measures.  Continue allergy injections.  Continue Singulair (montelukast) 10mg  once daily.  Continue Flonase (fluticasone) 1-2 spray per nostril daily.  May use Pataday (olopatadine 0.2%) one drop per eye once daily as needed for itchy/watery eyes.  Nasal saline spray (i.e., Simply Saline) or nasal saline lavage (i.e., NeilMed) is recommended as needed and prior to medicated nasal sprays.   May use over the counter antihistamines such as Allegra (fexofenadine) daily as needed.  Recommend that you get the COVID-19 vaccine when available to you. Just make sure you have at least 48 hours between the vaccine and your allergy shot.

## 2019-11-04 ENCOUNTER — Ambulatory Visit (INDEPENDENT_AMBULATORY_CARE_PROVIDER_SITE_OTHER): Payer: BC Managed Care – PPO

## 2019-11-04 DIAGNOSIS — J309 Allergic rhinitis, unspecified: Secondary | ICD-10-CM | POA: Diagnosis not present

## 2019-11-13 ENCOUNTER — Other Ambulatory Visit: Payer: Self-pay | Admitting: Internal Medicine

## 2019-11-19 ENCOUNTER — Ambulatory Visit (INDEPENDENT_AMBULATORY_CARE_PROVIDER_SITE_OTHER): Payer: BC Managed Care – PPO

## 2019-11-19 DIAGNOSIS — J309 Allergic rhinitis, unspecified: Secondary | ICD-10-CM | POA: Diagnosis not present

## 2019-11-26 ENCOUNTER — Ambulatory Visit (INDEPENDENT_AMBULATORY_CARE_PROVIDER_SITE_OTHER): Payer: BC Managed Care – PPO | Admitting: *Deleted

## 2019-11-26 DIAGNOSIS — J309 Allergic rhinitis, unspecified: Secondary | ICD-10-CM

## 2019-12-03 ENCOUNTER — Ambulatory Visit (INDEPENDENT_AMBULATORY_CARE_PROVIDER_SITE_OTHER): Payer: BC Managed Care – PPO

## 2019-12-03 DIAGNOSIS — J309 Allergic rhinitis, unspecified: Secondary | ICD-10-CM | POA: Diagnosis not present

## 2019-12-10 ENCOUNTER — Ambulatory Visit (INDEPENDENT_AMBULATORY_CARE_PROVIDER_SITE_OTHER): Payer: BC Managed Care – PPO

## 2019-12-10 DIAGNOSIS — J309 Allergic rhinitis, unspecified: Secondary | ICD-10-CM | POA: Diagnosis not present

## 2019-12-24 ENCOUNTER — Ambulatory Visit (INDEPENDENT_AMBULATORY_CARE_PROVIDER_SITE_OTHER): Payer: BC Managed Care – PPO | Admitting: *Deleted

## 2019-12-24 DIAGNOSIS — J309 Allergic rhinitis, unspecified: Secondary | ICD-10-CM | POA: Diagnosis not present

## 2019-12-26 ENCOUNTER — Other Ambulatory Visit: Payer: Self-pay | Admitting: Internal Medicine

## 2020-01-01 ENCOUNTER — Ambulatory Visit (INDEPENDENT_AMBULATORY_CARE_PROVIDER_SITE_OTHER): Payer: BC Managed Care – PPO

## 2020-01-01 DIAGNOSIS — J309 Allergic rhinitis, unspecified: Secondary | ICD-10-CM

## 2020-01-08 ENCOUNTER — Ambulatory Visit (INDEPENDENT_AMBULATORY_CARE_PROVIDER_SITE_OTHER): Payer: BC Managed Care – PPO

## 2020-01-08 DIAGNOSIS — J309 Allergic rhinitis, unspecified: Secondary | ICD-10-CM | POA: Diagnosis not present

## 2020-01-14 ENCOUNTER — Ambulatory Visit (INDEPENDENT_AMBULATORY_CARE_PROVIDER_SITE_OTHER): Payer: BC Managed Care – PPO | Admitting: *Deleted

## 2020-01-14 DIAGNOSIS — J309 Allergic rhinitis, unspecified: Secondary | ICD-10-CM | POA: Diagnosis not present

## 2020-01-23 ENCOUNTER — Other Ambulatory Visit: Payer: Self-pay | Admitting: Internal Medicine

## 2020-01-23 ENCOUNTER — Ambulatory Visit (INDEPENDENT_AMBULATORY_CARE_PROVIDER_SITE_OTHER): Payer: BC Managed Care – PPO | Admitting: *Deleted

## 2020-01-23 DIAGNOSIS — J309 Allergic rhinitis, unspecified: Secondary | ICD-10-CM | POA: Diagnosis not present

## 2020-01-27 ENCOUNTER — Other Ambulatory Visit: Payer: Self-pay | Admitting: Allergy

## 2020-01-27 ENCOUNTER — Other Ambulatory Visit: Payer: Self-pay | Admitting: Internal Medicine

## 2020-01-30 ENCOUNTER — Ambulatory Visit (INDEPENDENT_AMBULATORY_CARE_PROVIDER_SITE_OTHER): Payer: BC Managed Care – PPO

## 2020-01-30 DIAGNOSIS — J309 Allergic rhinitis, unspecified: Secondary | ICD-10-CM | POA: Diagnosis not present

## 2020-02-04 ENCOUNTER — Telehealth (INDEPENDENT_AMBULATORY_CARE_PROVIDER_SITE_OTHER): Payer: BC Managed Care – PPO | Admitting: Internal Medicine

## 2020-02-04 ENCOUNTER — Encounter: Payer: Self-pay | Admitting: Internal Medicine

## 2020-02-04 VITALS — Ht 64.0 in | Wt 235.0 lb

## 2020-02-04 DIAGNOSIS — E785 Hyperlipidemia, unspecified: Secondary | ICD-10-CM

## 2020-02-04 DIAGNOSIS — R748 Abnormal levels of other serum enzymes: Secondary | ICD-10-CM

## 2020-02-04 DIAGNOSIS — Z79899 Other long term (current) drug therapy: Secondary | ICD-10-CM

## 2020-02-04 DIAGNOSIS — E559 Vitamin D deficiency, unspecified: Secondary | ICD-10-CM | POA: Diagnosis not present

## 2020-02-04 DIAGNOSIS — R945 Abnormal results of liver function studies: Secondary | ICD-10-CM

## 2020-02-04 DIAGNOSIS — Z6841 Body Mass Index (BMI) 40.0 and over, adult: Secondary | ICD-10-CM

## 2020-02-04 DIAGNOSIS — Z9989 Dependence on other enabling machines and devices: Secondary | ICD-10-CM

## 2020-02-04 DIAGNOSIS — K760 Fatty (change of) liver, not elsewhere classified: Secondary | ICD-10-CM

## 2020-02-04 DIAGNOSIS — G4733 Obstructive sleep apnea (adult) (pediatric): Secondary | ICD-10-CM

## 2020-02-04 MED ORDER — ESCITALOPRAM OXALATE 10 MG PO TABS: 10.0000 mg | ORAL_TABLET | Freq: Every day | ORAL | 1 refills | Status: DC

## 2020-02-04 MED ORDER — VITAMIN D (ERGOCALCIFEROL) 1.25 MG (50000 UNIT) PO CAPS: 50000.0000 [IU] | ORAL_CAPSULE | ORAL | 0 refills | Status: DC

## 2020-02-04 MED ORDER — ATORVASTATIN CALCIUM 80 MG PO TABS: 80.0000 mg | ORAL_TABLET | Freq: Every day | ORAL | 0 refills | Status: DC

## 2020-02-04 NOTE — Progress Notes (Signed)
Virtual Visit via Video Note  I connected with@ on 02/04/20 at  4:00 PM EDT by a video enabled telemedicine application and verified that I am speaking with the correct person using two identifiers. Location patient: home Location provider:work office Persons participating in the virtual visit: patient, provider  [place]th national recommendations  regarding COVID 19 pandemic   video visit is advised over in office visit for this patient.  Patient aware  of the limitations of evaluation and management by telemedicine and  availability of in person appointments. and agreed to proceed.   HPI: Audrey Jenkins presents for video visit She is due for yearly labs visit and medication evaluation. Last year has not been optimal for her.  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  utd on covid vaccine   hld taking atorvastatin without problem  Taking Lexapro that has been helpful in the past and would like to remain on this without side effect.  bp gone up since   Weigh tup  120/80   Has been evaluated and has fatty liver but ultrasound showed change reduction and right kidney size from years ago and radiologist question whether she could have renal stenosis and hypertension.  cpap     Doing well with this   Allergy shots once a week   She has been taking vitamin D at 50,000 units a week for a while and then ran out but has been back on it this month.  No recent vitamin D level.  Past Medical History:  Diagnosis Date  . Abnormal LFTs   . Allergy   . Depression   . Fatty liver    on [place] Jan 2011  . Headache(784.0)   . Hyperlipidemia    Inital 400's  . Mood disorder ([place])   . Osteoarthritis   . Primiparous   . Snoring    ENT [facility] Jan 2011  nasal obstruction and cryptic tonsils    Past Surgical History:  Procedure Laterality Date  . BREAST SURGERY  1995   bx  . dental implant Left 11/2016  . TIBIA FRACTURE SURGERY     september 2020    Family History  Problem Relation Age of Onset  . Arthritis Mother   . COPD Mother   . Dementia Mother   . Endometrial cancer Mother   . Skin cancer Mother   . Osteoporosis Mother   . Other Mother        dementia  . Heart attack Father   . Hypertension Father        cabd Mi 64 died CHF  . Heart failure Father   . Heart disease Father   . Sleep apnea Sister        improved with weight loss  . Arthritis Sister   . Allergic rhinitis Sister   . Coronary artery disease Sister   . Vasculitis Sister   . Allergic rhinitis Sister   . Angioedema Neg Hx   . Asthma Neg Hx   . Eczema Neg Hx   . Immunodeficiency Neg Hx   . Urticaria Neg Hx     Social History   Tobacco Use  . Smoking status: Never Smoker  . Smokeless tobacco: Never Used  Substance Use Topics  . Alcohol use: Yes    Alcohol/week: 0.0 standard drinks    Comment: socially  . Drug use: No      Current Outpatient Medications:  .  acetaminophen (TYLENOL) 500 MG tablet, Take 500-1,000 mg by mouth every 6 (six) hours as needed., Disp: , Rfl:  .  atorvastatin (LIPITOR) 80 MG tablet, Take 1 tablet (80 mg total) by mouth daily., Disp: 90 tablet, Rfl: 0 .  eletriptan (RELPAX) 40 MG tablet, TAKE 1 TABLET BY MOUTH AS NEEDED FOR MIGRAINE OR HEADACHE. MAY REPEAT IN 2 HOURS IF NEEDED, Disp: 9 tablet, Rfl: 1 .  EPINEPHrine (AUVI-Q) 0.3 mg/0.3 mL IJ SOAJ injection, Inject 0.3 mLs (0.3 mg total) into the muscle as needed for anaphylaxis., Disp: 2 each, Rfl: 1 .  escitalopram (LEXAPRO) 10 MG tablet, Take 1 tablet (10 mg total) by mouth daily., Disp: 90 tablet, Rfl: 1 .  estradiol (ESTRACE) 0.1 MG/GM vaginal cream, Place 1 Applicatorful vaginally 3 (three) times a week. PRN. Schedule follow up for further refills. [phone],  Disp: 42.5 g, Rfl: 0 .  fexofenadine (ALLEGRA) 180 MG tablet, Take 180 mg by mouth daily., Disp: , Rfl:  .  fluticasone (FLONASE) 50 MCG/ACT nasal spray, USE 1-2 SPRAYS PER NOSTRIL FOR SINUS ISSUES., Disp: 48 mL, Rfl: 2 .  montelukast (SINGULAIR) 10 MG tablet, Take 1 tablet (10 mg total) by mouth at bedtime., Disp: 90 tablet, Rfl: 3 .  Olopatadine HCl 0.2 % SOLN, Apply 2 drops to eye daily as needed., Disp: 2.5 mL, Rfl: 5 .  Vitamin D, Ergocalciferol, (DRISDOL) 1.25 MG (50000 UNIT) CAPS capsule, Take 1 capsule (50,000 Units total) by mouth every 7 (seven) days., Disp: 4 capsule, Rfl: 0  EXAM: BP Readings from Last 3 Encounters:  10/31/19 122/74  04/22/19 114/80  04/18/19 108/60   Wt Readings from Last 3 Encounters:  02/04/20 235 lb (106.6 kg)  10/31/19 235 lb (106.6 kg)  04/22/19 236 lb (107 kg)     VITALS per patient if applicable: States her blood pressures in the 120/80 range.  Has a monitor at home  GENERAL: alert, oriented, appears well and in no acute distress  HEENT: atraumatic, conjunttiva clear, no obvious abnormalities on inspection of external nose and ears  NECK: normal movements of the head and neck  LUNGS: on inspection no signs of respiratory distress, breathing rate appears normal, no obvious gross SOB, gasping or wheezing  CV: no obvious cyanosis  MS: moves all visible extremities without noticeable abnormality  PSYCH/NEURO: pleasant and cooperative, no obvious depression or anxiety, speech and thought processing grossly intact Lab Results  Component Value Date   WBC 7.6 01/17/2019   HGB 13.0 01/17/2019   HCT 38.9 01/17/2019   PLT 266.0 01/17/2019   GLUCOSE 90 04/25/2019   CHOL 250 (H) 01/17/2019   TRIG 150.0 (H) 01/17/2019   HDL 62.50 01/17/2019   LDLDIRECT 157.4 02/19/2014   LDLCALC 157 (H) 01/17/2019   ALT 32 04/25/2019   AST 28 04/25/2019   NA 142 01/17/2019   K 4.5 01/17/2019   CL 106 01/17/2019   CREATININE 0.72 01/17/2019   BUN 11  01/17/2019   CO2 25 01/17/2019   TSH 2.74 03/18/2018   BP Readings from Last 3 Encounters:  10/31/19 122/74  04/22/19 114/80  04/18/19 108/60  CLINICAL DATA:  Elevated liver enzymes  EXAM: ABDOMEN ULTRASOUND COMPLETE  COMPARISON:  Abdominal ultrasound September 06, 2009  FINDINGS: Gallbladder: No gallstones or wall thickening visualized. There is  no pericholecystic fluid. No sonographic Murphy sign noted by sonographer.  Common bile duct: Diameter: 3 mm. No intrahepatic, common hepatic, or common bile duct dilatation.  Liver: No focal lesion identified. Liver echotexture is increased diffusely. Portal vein is patent on color Doppler imaging with normal direction of blood flow towards the liver.  IVC: No abnormality visualized.  Pancreas: No pancreatic mass or inflammatory focus.  Spleen: Size and appearance within normal limits.  Right Kidney: Length: 8.0 cm. Echogenicity within normal limits. No mass or hydronephrosis visualized.  Left Kidney: Length: 0.6 cm. Echogenicity within normal limits. No mass or hydronephrosis visualized.  Abdominal aorta: No aneurysm visualized.  Other findings: No demonstrable ascites.  IMPRESSION: 1. Diffuse increase in liver echogenicity, a finding indicative of hepatic steatosis. While no focal liver lesions are evident on this study, it must be cautioned that the sensitivity of ultrasound for detection of focal liver lesions is diminished in this circumstance.  2. Right kidney is significantly smaller than left kidney, a change from prior study. The right kidney is normal in contour without focal renal lesion evident on the right. This size discrepancy raises concern for potential renal artery stenosis on the right. In this regard, question whether patient is hypertensive.  3.  Study otherwise unremarkable.   Electronically Signed   By: Lowella Grip III M.D.   On: 04/25/2019 11:55   ASSESSMENT AND  PLAN:  Discussed the following assessment and plan:    ICD-10-CM   1. Hyperlipidemia, unspecified hyperlipidemia type  K53.9 Basic metabolic panel    CBC with Differential/Platelet    Hemoglobin A1c    Hepatic function panel    Lipid panel    TSH    T4, free    Microalbumin / creatinine urine ratio    VITAMIN D 25 Hydroxy (Vit-D Deficiency, Fractures)  2. Medication management  J67.341 Basic metabolic panel    CBC with Differential/Platelet    Hemoglobin A1c    Hepatic function panel    Lipid panel    TSH    T4, free    Microalbumin / creatinine urine ratio    VITAMIN D 25 Hydroxy (Vit-D Deficiency, Fractures)  3. Alkaline phosphatase elevation  P37.9 Basic metabolic panel    CBC with Differential/Platelet    Hemoglobin A1c    Hepatic function panel    Lipid panel    TSH    T4, free    Microalbumin / creatinine urine ratio    VITAMIN D 25 Hydroxy (Vit-D Deficiency, Fractures)  4. Vitamin D deficiency  K24.0 Basic metabolic panel    CBC with Differential/Platelet    Hemoglobin A1c    Hepatic function panel    Lipid panel    TSH    T4, free    Microalbumin / creatinine urine ratio    VITAMIN D 25 Hydroxy (Vit-D Deficiency, Fractures)  5. LIVER FUNCTION TESTS, ABNORMAL  X73.5 Basic metabolic panel    CBC with Differential/Platelet    Hemoglobin A1c    Hepatic function panel    Lipid panel    TSH    T4, free    Microalbumin / creatinine urine ratio    VITAMIN D 25 Hydroxy (Vit-D Deficiency, Fractures)  6. Fatty liver  K76.0   7. OSA on CPAP  G47.33    Z99.89   8. BMI 40.0-44.9, adult (HCC)  Z68.41    Fasting lab and urine at [facility]  Send in bp readings twice daily for 5 days and go from there .  Not sure what to make of the change in renal size ( I think her kidney size meant to be 6 cm and not 0.6 cm) without hypertension will document and urine protein.  Her renal function is apparently been normal in the past on review we will plan after labs done and blood  pressure readings.  At this time maintain her medications and work on healthy eating activity and healthy weight loss   Counseled.   Expectant management and discussion of plan and treatment with opportunity to ask questions and all were answered. The patient agreed with the plan and demonstrated an understanding of the instructions.   Advised to call back or seek an in-person evaluation if worsening  or having  further concerns .  In the interim Return for depending on results and  eventually in person. . 32 minutes record review  Visit plan counseling  and ordering    Shanon Ace, MD

## 2020-02-05 ENCOUNTER — Ambulatory Visit (INDEPENDENT_AMBULATORY_CARE_PROVIDER_SITE_OTHER): Payer: BC Managed Care – PPO

## 2020-02-05 DIAGNOSIS — J309 Allergic rhinitis, unspecified: Secondary | ICD-10-CM | POA: Diagnosis not present

## 2020-02-10 ENCOUNTER — Ambulatory Visit (INDEPENDENT_AMBULATORY_CARE_PROVIDER_SITE_OTHER): Payer: BC Managed Care – PPO

## 2020-02-10 ENCOUNTER — Other Ambulatory Visit (INDEPENDENT_AMBULATORY_CARE_PROVIDER_SITE_OTHER): Payer: BC Managed Care – PPO

## 2020-02-10 DIAGNOSIS — E785 Hyperlipidemia, unspecified: Secondary | ICD-10-CM | POA: Diagnosis not present

## 2020-02-10 DIAGNOSIS — R945 Abnormal results of liver function studies: Secondary | ICD-10-CM

## 2020-02-10 DIAGNOSIS — E559 Vitamin D deficiency, unspecified: Secondary | ICD-10-CM | POA: Diagnosis not present

## 2020-02-10 DIAGNOSIS — R748 Abnormal levels of other serum enzymes: Secondary | ICD-10-CM | POA: Diagnosis not present

## 2020-02-10 DIAGNOSIS — Z79899 Other long term (current) drug therapy: Secondary | ICD-10-CM | POA: Diagnosis not present

## 2020-02-10 DIAGNOSIS — J309 Allergic rhinitis, unspecified: Secondary | ICD-10-CM

## 2020-02-10 LAB — T4, FREE: Free T4: 0.72 ng/dL (ref 0.60–1.60)

## 2020-02-10 LAB — MICROALBUMIN / CREATININE URINE RATIO
Creatinine,U: 134.7 mg/dL
Microalb Creat Ratio: 0.5 mg/g (ref 0.0–30.0)
Microalb, Ur: <0.7 mg/dL (ref 0.0–1.9)

## 2020-02-10 LAB — HEPATIC FUNCTION PANEL
ALT: 17 U/L (ref 0–35)
AST: 18 U/L (ref 0–37)
Albumin: 4.3 g/dL (ref 3.5–5.2)
Alkaline Phosphatase: 121 U/L — ABNORMAL HIGH (ref 39–117)
Bilirubin, Direct: 0.1 mg/dL (ref 0.0–0.3)
Total Bilirubin: 0.5 mg/dL (ref 0.2–1.2)
Total Protein: 7.5 g/dL (ref 6.0–8.3)

## 2020-02-10 LAB — HEMOGLOBIN A1C: Hgb A1c MFr Bld: 6.1 % (ref 4.6–6.5)

## 2020-02-10 LAB — CBC WITH DIFFERENTIAL/PLATELET
Basophils Absolute: 0.1 10*3/uL (ref 0.0–0.1)
Basophils Relative: 0.9 % (ref 0.0–3.0)
Eosinophils Absolute: 0.2 10*3/uL (ref 0.0–0.7)
Eosinophils Relative: 2.1 % (ref 0.0–5.0)
HCT: 31 % — ABNORMAL LOW (ref 36.0–46.0)
Hemoglobin: 9.6 g/dL — ABNORMAL LOW (ref 12.0–15.0)
Lymphocytes Relative: 24 % (ref 12.0–46.0)
Lymphs Abs: 2.1 10*3/uL (ref 0.7–4.0)
MCHC: 31 g/dL (ref 30.0–36.0)
MCV: 75.3 fl — ABNORMAL LOW (ref 78.0–100.0)
Monocytes Absolute: 0.8 10*3/uL (ref 0.1–1.0)
Monocytes Relative: 9.2 % (ref 3.0–12.0)
Neutro Abs: 5.7 10*3/uL (ref 1.4–7.7)
Neutrophils Relative %: 63.8 % (ref 43.0–77.0)
Platelets: 353 10*3/uL (ref 150.0–400.0)
RBC: 4.11 Mil/uL (ref 3.87–5.11)
RDW: 16.6 % — ABNORMAL HIGH (ref 11.5–15.5)
WBC: 8.9 10*3/uL (ref 4.0–10.5)

## 2020-02-10 LAB — BASIC METABOLIC PANEL
BUN: 7 mg/dL (ref 6–23)
CO2: 27 mEq/L (ref 19–32)
Calcium: 9.4 mg/dL (ref 8.4–10.5)
Chloride: 105 mEq/L (ref 96–112)
Creatinine, Ser: 0.78 mg/dL (ref 0.40–1.20)
GFR: 74.73 mL/min (ref >60.00)
Glucose, Bld: 94 mg/dL (ref 70–99)
Potassium: 4.6 mEq/L (ref 3.5–5.1)
Sodium: 140 mEq/L (ref 135–145)

## 2020-02-10 LAB — LIPID PANEL
Cholesterol: 203 mg/dL — ABNORMAL HIGH (ref 0–200)
HDL: 55.5 mg/dL (ref >39.00)
LDL Cholesterol: 122 mg/dL — ABNORMAL HIGH (ref 0–99)
NonHDL: 147.47
Total CHOL/HDL Ratio: 4
Triglycerides: 127 mg/dL (ref 0.0–149.0)
VLDL: 25.4 mg/dL (ref 0.0–40.0)

## 2020-02-10 LAB — TSH: TSH: 1.85 u[IU]/mL (ref 0.35–4.50)

## 2020-02-10 LAB — VITAMIN D 25 HYDROXY (VIT D DEFICIENCY, FRACTURES): VITD: 36.77 ng/mL (ref 30.00–100.00)

## 2020-02-12 ENCOUNTER — Other Ambulatory Visit: Payer: Self-pay | Admitting: Internal Medicine

## 2020-02-12 DIAGNOSIS — D649 Anemia, unspecified: Secondary | ICD-10-CM

## 2020-02-12 DIAGNOSIS — R93421 Abnormal radiologic findings on diagnostic imaging of right kidney: Secondary | ICD-10-CM

## 2020-02-12 DIAGNOSIS — Q603 Renal hypoplasia, unilateral: Secondary | ICD-10-CM

## 2020-02-12 NOTE — Progress Notes (Signed)
Orders placed for  Blood work in a month  And  Duplex US of kidney renal artery

## 2020-02-12 NOTE — Progress Notes (Signed)
Blood work  stable except anemia ( could be from the fracture? )  We need to   fu with this  .   Your kidney function appears normal ( reassuring)  Please send in readings of your BP when available.   I reviewed the  kidney ultrasound  I suggest we get a duplex ultrasound of the kidneys to check for  artery flow to the kidneys  .   I will place an order  no emergency to this .  Also will order fu blood count   at [place] to be done in  the next 2-4 weeks as possible  will check iron levels etc .  Megan I will place orders  for above

## 2020-02-18 ENCOUNTER — Other Ambulatory Visit: Payer: Self-pay | Admitting: Internal Medicine

## 2020-02-18 ENCOUNTER — Ambulatory Visit (INDEPENDENT_AMBULATORY_CARE_PROVIDER_SITE_OTHER): Payer: BC Managed Care – PPO

## 2020-02-18 DIAGNOSIS — J309 Allergic rhinitis, unspecified: Secondary | ICD-10-CM | POA: Diagnosis not present

## 2020-02-18 NOTE — Telephone Encounter (Signed)
Recent lab from 02/10/2020 shows lower end of normal. OK to continue to fill?

## 2020-02-20 ENCOUNTER — Telehealth: Payer: Self-pay

## 2020-02-20 NOTE — Telephone Encounter (Signed)
Patient called and I gave her the message. Patient verbalized an understanding.

## 2020-02-20 NOTE — Telephone Encounter (Signed)
Called and left patient a detailed voice message.to give her message per Dr. Regis Bill: I would like her to try OTC Vit D 3  4000 IU per day  And then we can check level   In 3-4 months or as indicated

## 2020-02-20 NOTE — Telephone Encounter (Signed)
I would like her to try OTC Vit D 3  4000 IU per day  And then we can check level   In 3-4 months or as indicated

## 2020-02-25 ENCOUNTER — Ambulatory Visit (INDEPENDENT_AMBULATORY_CARE_PROVIDER_SITE_OTHER): Payer: BC Managed Care – PPO

## 2020-02-25 ENCOUNTER — Other Ambulatory Visit (INDEPENDENT_AMBULATORY_CARE_PROVIDER_SITE_OTHER): Payer: BC Managed Care – PPO

## 2020-02-25 DIAGNOSIS — J309 Allergic rhinitis, unspecified: Secondary | ICD-10-CM

## 2020-02-25 DIAGNOSIS — D649 Anemia, unspecified: Secondary | ICD-10-CM

## 2020-02-25 LAB — CBC WITH DIFFERENTIAL/PLATELET
Basophils Absolute: 0.1 10*3/uL (ref 0.0–0.1)
Basophils Relative: 0.9 % (ref 0.0–3.0)
Eosinophils Absolute: 0.2 10*3/uL (ref 0.0–0.7)
Eosinophils Relative: 2.1 % (ref 0.0–5.0)
HCT: 29.7 % — ABNORMAL LOW (ref 36.0–46.0)
Hemoglobin: 9.4 g/dL — ABNORMAL LOW (ref 12.0–15.0)
Lymphocytes Relative: 30.1 % (ref 12.0–46.0)
Lymphs Abs: 2.3 10*3/uL (ref 0.7–4.0)
MCHC: 31.8 g/dL (ref 30.0–36.0)
MCV: 74.7 fl — ABNORMAL LOW (ref 78.0–100.0)
Monocytes Absolute: 0.6 10*3/uL (ref 0.1–1.0)
Monocytes Relative: 8.1 % (ref 3.0–12.0)
Neutro Abs: 4.6 10*3/uL (ref 1.4–7.7)
Neutrophils Relative %: 58.8 % (ref 43.0–77.0)
Platelets: 330 10*3/uL (ref 150.0–400.0)
RBC: 3.98 Mil/uL (ref 3.87–5.11)
RDW: 17.7 % — ABNORMAL HIGH (ref 11.5–15.5)
WBC: 7.8 10*3/uL (ref 4.0–10.5)

## 2020-02-25 LAB — IBC PANEL
Iron: 21 ug/dL — ABNORMAL LOW (ref 42–145)
Saturation Ratios: 4 % — ABNORMAL LOW (ref 20.0–50.0)
Transferrin: 372 mg/dL — ABNORMAL HIGH (ref 212.0–360.0)

## 2020-02-25 NOTE — Telephone Encounter (Signed)
Thanks for the  Readings they look good

## 2020-02-25 NOTE — Addendum Note (Signed)
Addended by: Boris Lown B on: 02/25/2020 04:54 PM   Modules accepted: Orders

## 2020-02-25 NOTE — Telephone Encounter (Signed)
No need to fast

## 2020-02-26 LAB — RETICULOCYTES
ABS Retic: 73800 cells/uL (ref 20000–8000)
Retic Ct Pct: 1.8 %

## 2020-02-26 LAB — FERRITIN: Ferritin: 7.9 ng/mL — ABNORMAL LOW (ref 10.0–291.0)

## 2020-02-27 NOTE — Progress Notes (Signed)
Anemia is no better  and is from iron deficiency.   Take iron pill OTC  once or twice a day or every other day  and   refer to GI    to make sure no gi loss of blood   with continued anemia.   Plan   fu  cbc and diff  in 1-2 months to be sure not  dropping   This level indeed can cause you to feel tired

## 2020-03-02 ENCOUNTER — Other Ambulatory Visit: Payer: Self-pay

## 2020-03-02 DIAGNOSIS — D649 Anemia, unspecified: Secondary | ICD-10-CM

## 2020-03-02 NOTE — Telephone Encounter (Signed)
I am concerns about a slow blood leak and then not absorbing  Iron in foods ?   Could have and stomach ulcer if having  Upper stomach pain  .  don't think the food or acid foods  themselves causing the anemia

## 2020-03-05 ENCOUNTER — Other Ambulatory Visit: Payer: BC Managed Care – PPO

## 2020-03-08 ENCOUNTER — Other Ambulatory Visit: Payer: Self-pay | Admitting: Internal Medicine

## 2020-03-08 ENCOUNTER — Other Ambulatory Visit: Payer: Self-pay | Admitting: Allergy

## 2020-03-10 ENCOUNTER — Encounter: Payer: Self-pay | Admitting: Internal Medicine

## 2020-03-12 ENCOUNTER — Ambulatory Visit (INDEPENDENT_AMBULATORY_CARE_PROVIDER_SITE_OTHER): Payer: BC Managed Care – PPO

## 2020-03-12 DIAGNOSIS — J309 Allergic rhinitis, unspecified: Secondary | ICD-10-CM | POA: Diagnosis not present

## 2020-03-16 NOTE — Progress Notes (Signed)
VIALS EXP 03-16-21

## 2020-03-18 ENCOUNTER — Ambulatory Visit (INDEPENDENT_AMBULATORY_CARE_PROVIDER_SITE_OTHER): Payer: BC Managed Care – PPO

## 2020-03-18 DIAGNOSIS — J309 Allergic rhinitis, unspecified: Secondary | ICD-10-CM

## 2020-03-22 ENCOUNTER — Ambulatory Visit (INDEPENDENT_AMBULATORY_CARE_PROVIDER_SITE_OTHER): Payer: BC Managed Care – PPO

## 2020-03-22 DIAGNOSIS — J309 Allergic rhinitis, unspecified: Secondary | ICD-10-CM

## 2020-03-23 DIAGNOSIS — J3089 Other allergic rhinitis: Secondary | ICD-10-CM | POA: Diagnosis not present

## 2020-03-23 NOTE — Progress Notes (Signed)
Good news  the kidney  test you have done at [facility] is normal . And kidney size is  good. So does not confirm any problem with kidney size and circulation is good.

## 2020-03-25 NOTE — Progress Notes (Signed)
Her updated vascular US was normal FYI Dont think allergy shots would cause anemia  Can always ask the allergist

## 2020-04-01 ENCOUNTER — Ambulatory Visit: Payer: BC Managed Care – PPO | Admitting: Nurse Practitioner

## 2020-04-01 ENCOUNTER — Ambulatory Visit (INDEPENDENT_AMBULATORY_CARE_PROVIDER_SITE_OTHER): Payer: BC Managed Care – PPO

## 2020-04-01 ENCOUNTER — Encounter: Payer: Self-pay | Admitting: Nurse Practitioner

## 2020-04-01 VITALS — BP 102/70 | HR 83 | Ht 63.0 in | Wt 230.0 lb

## 2020-04-01 DIAGNOSIS — J309 Allergic rhinitis, unspecified: Secondary | ICD-10-CM

## 2020-04-01 DIAGNOSIS — R1012 Left upper quadrant pain: Secondary | ICD-10-CM

## 2020-04-01 DIAGNOSIS — D509 Iron deficiency anemia, unspecified: Secondary | ICD-10-CM

## 2020-04-01 MED ORDER — SUPREP BOWEL PREP KIT 17.5-3.13-1.6 GM/177ML PO SOLN
1.0000 | ORAL | 0 refills | Status: DC
Start: 2020-04-01 — End: 2020-04-26

## 2020-04-01 NOTE — Patient Instructions (Addendum)
If you are age 62 or older, your body mass index should be between 23-30. Your Body mass index is 40.74 kg/m. If this is out of the aforementioned range listed, please consider follow up with your Primary Care Provider.  If you are age 30 or younger, your body mass index should be between 19-25. Your Body mass index is 40.74 kg/m. If this is out of the aformentioned range listed, please consider follow up with your Primary Care Provider.    Pick up your colon prep at your pharmacy. (SUPREP)  Stop taking your iron supplement 1 week prior to your procedure.  Due to recent changes in healthcare laws, you may see the results of your imaging and laboratory studies on MyChart before your provider has had a chance to review them.  We understand that in some cases there may be results that are confusing or concerning to you. Not all laboratory results come back in the same time frame and the provider may be waiting for multiple results in order to interpret others.  Please give [place] 48 hours in order for your provider to thoroughly review all the results before contacting the office for clarification of your results.   Thank you for choosing [facility] Noralyn Pick, CRNP

## 2020-04-01 NOTE — Progress Notes (Signed)
04/01/2020 Audrey Jenkins [id number] Dec 19, 1955   Chief Complaint: IDA  History of Present Illness: Audrey Jenkins. Player is a 48 female with a past medical history of depression, osteoarthritis, fatty liver, elevated LFTs and Alk phos level. She was initially seen in our office by Dr. Tarri Glenn on 04/22/2019 for evaluation regarding elevated Alk phos and GGT levels. See hepatic evaluation below.   She presents today for further evaluation regarding iron deficiency anemia.  She developed fatigue with dyspnea with exertion early June 2021.  She underwent laboratory studies by her PCP which identified iron deficiency anemia. Hg 9.6 (baseline Hg 13 on 01/17/2019). Iron 21. Ferritin 7.9.  She was prescribed Slow-Fe 62m one daily otc. She reports having left upper quadrant mild discomfort which occurs after she eats for the past few months.  She describes hearing a noise to the left upper quadrant area which occurs after eating food then takes a deep breath in.  She stated her husband hears the same noise.  She denies any associated cough or shortness of breath when she hears this noise.  She has mild nausea without vomiting.  Decreased appetite.  She is passing a normal brown bowel movement daily.  Her stools are a bit darker since starting p.o. iron.  No rectal bleeding or melena.  She reported having a normal colonoscopy by Dr. BMaurene CapesNovember 2010.  She has lost 5 pounds over the past 3 months.  She was started on oral iron approximately 6 weeks ago.  Her energy level has improved since that time.  He is less dyspneic as well.  No NSAIDs for the past 1 to 2 years.  No family history of gastric or colon cancer.  Labs 02/10/2020: Hemoglobin 9.6. Hematocrit 31.0. MCV 75.3. Platelet 353. Labs 02/25/2020: Iron 21. Ferritin 7.9.  CMP Latest Ref Rng & Units 02/10/2020 04/25/2019 04/25/2019  Glucose 70 - 99 mg/dL 94 - 90  BUN 6 - 23 mg/dL 7 - -  Creatinine 0.40 - 1.20 mg/dL 0.78 - -  Sodium 135 - 145 mEq/L 140 - -    Potassium 3.5 - 5.1 mEq/L 4.6 - -  Chloride 96 - 112 mEq/L 105 - -  CO2 19 - 32 mEq/L 27 - -  Calcium 8.4 - 10.5 mg/dL 9.4 - -  Total Protein 6.0 - 8.3 g/dL 7.5 - 7.2  Total Bilirubin 0.2 - 1.2 mg/dL 0.5 - 0.5  Alkaline Phos 39 - 117 U/L 121(H) 119(H) 120(H)  AST 0 - 37 U/L 18 - 28  ALT 0 - 35 U/L 17 - 32   CBC Latest Ref Rng & Units 02/25/2020 02/10/2020 01/17/2019  WBC 4.0 - 10.5 K/uL 7.8 8.9 7.6  Hemoglobin 12.0 - 15.0 g/dL 9.4(L) 9.6(L) 13.0  Hematocrit 36 - 46 % 29.7(L) 31.0(L) 38.9  Platelets 150 - 400 K/uL 330.0 353.0 266.0     11/25/2010: AMA normal at 0.54 01/17/2019: GGT 56.  CRP less than 1, vitamin D 23.74, triglycerides 150, platelets 266, sedimentation rate 46 03/18/2018:  ANA positive in a 1-80 homogenous pattern. 07/05/2018: AMA less than 20 04/25/2019: 5 Nucleotidase level 5.   Abdominal sonogram 04/25/2019: 1. Diffuse increase in liver echogenicity, a finding indicative of hepatic steatosis. While no focal liver lesions are evident on this study, it must be cautioned that the sensitivity of ultrasound for detection of focal liver lesions is diminished in this circumstance.  2. Right kidney is significantly smaller than left kidney, a change from prior study. The right  kidney is normal in contour without focal renal lesion evident on the right. This size discrepancy raises concern for potential renal artery stenosis on the right. In this regard, question whether patient is hypertensive.  3.  Study otherwise unremarkable.  Current Outpatient Medications on File Prior to Visit  Medication Sig Dispense Refill  . acetaminophen (TYLENOL) 500 MG tablet Take 500-1,000 mg by mouth every 6 (six) hours as needed.    Marland Kitchen atorvastatin (LIPITOR) 80 MG tablet Take 1 tablet (80 mg total) by mouth daily. 90 tablet 0  . eletriptan (RELPAX) 40 MG tablet TAKE 1 TABLET BY MOUTH AS NEEDED FOR MIGRAINE OR HEADACHE. MAY REPEAT IN 2 HOURS IF NEEDED 9 tablet 1  . EPINEPHrine (AUVI-Q) 0.3  mg/0.3 mL IJ SOAJ injection Inject 0.3 mLs (0.3 mg total) into the muscle as needed for anaphylaxis. 2 each 1  . escitalopram (LEXAPRO) 10 MG tablet Take 1 tablet (10 mg total) by mouth daily. 90 tablet 1  . estradiol (ESTRACE) 0.1 MG/GM vaginal cream Place 1 Applicatorful vaginally 3 (three) times a week. PRN. Schedule follow up for further refills. [phone] 42.5 g 0  . Ferrous Sulfate 142 (45 Fe) MG TBCR Take 1 tablet by mouth daily.    . fexofenadine (ALLEGRA) 180 MG tablet Take 180 mg by mouth daily.    . fluticasone (FLONASE) 50 MCG/ACT nasal spray USE 1-2 SPRAYS PER NOSTRIL FOR SINUS ISSUES. 48 mL 2  . montelukast (SINGULAIR) 10 MG tablet TAKE 1 TABLET BY MOUTH EVERYDAY AT BEDTIME 90 tablet 3  . Olopatadine HCl 0.2 % SOLN Apply 2 drops to eye daily as needed. 2.5 mL 5  . Vitamin D, Ergocalciferol, (DRISDOL) 1.25 MG (50000 UNIT) CAPS capsule Take 1 capsule (50,000 Units total) by mouth every 7 (seven) days. 4 capsule 0   No current facility-administered medications on file prior to visit.    Allergies  Allergen Reactions  . Cetirizine Hcl     REACTION: flu symptoms  . Codeine   . Pravastatin Sodium     REACTION: Diarrhea  . Propoxyphene N-Acetaminophen     REACTION: extreme nausea  . Rhinocort [Budesonide]     Nose sores  . Zetia [Ezetimibe]     Body aches, and "sick feeling"     Current Medications, Allergies, Past Medical History, Past Surgical History, Family History and Social History were reviewed in [employer] record.   Physical Exam: BP 102/70   Pulse 83   Ht 5' 3" (1.6 m)   Wt 230 lb (104.3 kg)   BMI 40.74 kg/m   General: Well developed 65 year old female in no acute distress. Head: Normocephalic and atraumatic. Eyes: No scleral icterus. Conjunctiva pink . Ears: Normal auditory acuity. Mouth: Dentition intact. No ulcers or lesions.  Lungs: Clear throughout to auscultation. Heart: Regular rate and rhythm, no murmur. Abdomen: Soft,  nontender and nondistended. No masses or hepatomegaly. Normal bowel sounds x 4 quadrants.  Rectal: Deferred.  Musculoskeletal: Symmetrical with no gross deformities. Extremities: No edema. Neurological: Alert oriented x 4. No focal deficits.  Psychological: Alert and cooperative. Normal mood and affect  Assessment and Recommendations:  60. 64 year old female with IDA. -EGD and colonoscopy benefits and risks discussed including risk with sedation, risk of bleeding, perforation and infection  -Hold oral iron for 1 week prior to colonoscopy prep day  2.  Left upper quadrant abdominal pain -See plan in #1 -Consider scheduling an abd/pelvic CT If left upper quadrant pain persists with  associated "noise" with inspiration  3. Elevated alk phos level -Consider MRCP to further evaluate the common bile duct   Patient to follow up with Dr. Tarri Glenn 2 weeks after her EGD and colonoscopy are completed to review the procedure results and for elevated alk phos follow-up

## 2020-04-06 ENCOUNTER — Ambulatory Visit (INDEPENDENT_AMBULATORY_CARE_PROVIDER_SITE_OTHER): Payer: BC Managed Care – PPO

## 2020-04-06 DIAGNOSIS — J309 Allergic rhinitis, unspecified: Secondary | ICD-10-CM | POA: Diagnosis not present

## 2020-04-12 ENCOUNTER — Ambulatory Visit: Payer: BC Managed Care – PPO | Admitting: Nurse Practitioner

## 2020-04-14 ENCOUNTER — Other Ambulatory Visit: Payer: Self-pay | Admitting: Internal Medicine

## 2020-04-14 ENCOUNTER — Ambulatory Visit (INDEPENDENT_AMBULATORY_CARE_PROVIDER_SITE_OTHER): Payer: BC Managed Care – PPO | Admitting: *Deleted

## 2020-04-14 DIAGNOSIS — J309 Allergic rhinitis, unspecified: Secondary | ICD-10-CM

## 2020-04-16 ENCOUNTER — Encounter: Payer: BC Managed Care – PPO | Admitting: Gastroenterology

## 2020-04-19 ENCOUNTER — Ambulatory Visit (INDEPENDENT_AMBULATORY_CARE_PROVIDER_SITE_OTHER): Payer: BC Managed Care – PPO

## 2020-04-19 DIAGNOSIS — J309 Allergic rhinitis, unspecified: Secondary | ICD-10-CM

## 2020-04-19 NOTE — Progress Notes (Signed)
Reviewed and agree with management plans. Will plan cross-sectional imaging after endoscopic evaluation.   Sharona Rovner L. Tarri Glenn, MD, MPH

## 2020-04-20 ENCOUNTER — Encounter: Payer: Self-pay | Admitting: Gastroenterology

## 2020-04-21 ENCOUNTER — Other Ambulatory Visit: Payer: Self-pay

## 2020-04-21 DIAGNOSIS — D649 Anemia, unspecified: Secondary | ICD-10-CM

## 2020-04-21 DIAGNOSIS — Z1389 Encounter for screening for other disorder: Secondary | ICD-10-CM

## 2020-04-21 NOTE — Telephone Encounter (Signed)
I dont see a recent  Urinalysis   For blood in the system   ( just the protein negative )    But  I agree we shoujd get a complete urinalysis .    Not a common cause  But always possible  Please order urinalysis  with micro  At the [facility]

## 2020-04-26 ENCOUNTER — Other Ambulatory Visit (INDEPENDENT_AMBULATORY_CARE_PROVIDER_SITE_OTHER): Payer: BC Managed Care – PPO

## 2020-04-26 ENCOUNTER — Encounter: Payer: Self-pay | Admitting: Gastroenterology

## 2020-04-26 ENCOUNTER — Ambulatory Visit (AMBULATORY_SURGERY_CENTER): Payer: BC Managed Care – PPO | Admitting: Gastroenterology

## 2020-04-26 ENCOUNTER — Other Ambulatory Visit: Payer: Self-pay

## 2020-04-26 VITALS — BP 128/56 | HR 51 | Temp 99.5°F | Resp 18 | Ht 63.0 in | Wt 230.0 lb

## 2020-04-26 DIAGNOSIS — R1012 Left upper quadrant pain: Secondary | ICD-10-CM

## 2020-04-26 DIAGNOSIS — K2100 Gastro-esophageal reflux disease with esophagitis, without bleeding: Secondary | ICD-10-CM

## 2020-04-26 DIAGNOSIS — D128 Benign neoplasm of rectum: Secondary | ICD-10-CM

## 2020-04-26 DIAGNOSIS — D129 Benign neoplasm of anus and anal canal: Secondary | ICD-10-CM

## 2020-04-26 DIAGNOSIS — D509 Iron deficiency anemia, unspecified: Secondary | ICD-10-CM | POA: Diagnosis not present

## 2020-04-26 DIAGNOSIS — D124 Benign neoplasm of descending colon: Secondary | ICD-10-CM | POA: Diagnosis not present

## 2020-04-26 DIAGNOSIS — D649 Anemia, unspecified: Secondary | ICD-10-CM

## 2020-04-26 DIAGNOSIS — D122 Benign neoplasm of ascending colon: Secondary | ICD-10-CM

## 2020-04-26 DIAGNOSIS — D120 Benign neoplasm of cecum: Secondary | ICD-10-CM

## 2020-04-26 DIAGNOSIS — K219 Gastro-esophageal reflux disease without esophagitis: Secondary | ICD-10-CM

## 2020-04-26 DIAGNOSIS — K449 Diaphragmatic hernia without obstruction or gangrene: Secondary | ICD-10-CM

## 2020-04-26 DIAGNOSIS — Z1389 Encounter for screening for other disorder: Secondary | ICD-10-CM | POA: Diagnosis not present

## 2020-04-26 DIAGNOSIS — K3189 Other diseases of stomach and duodenum: Secondary | ICD-10-CM

## 2020-04-26 HISTORY — PX: COLONOSCOPY W/ BIOPSIES: SHX1374

## 2020-04-26 LAB — CBC WITH DIFFERENTIAL/PLATELET
Basophils Absolute: 0.1 10*3/uL (ref 0.0–0.1)
Basophils Relative: 0.6 % (ref 0.0–3.0)
Eosinophils Absolute: 0.2 10*3/uL (ref 0.0–0.7)
Eosinophils Relative: 2.6 % (ref 0.0–5.0)
HCT: 41.1 % (ref 36.0–46.0)
Hemoglobin: 13.2 g/dL (ref 12.0–15.0)
Lymphocytes Relative: 23.7 % (ref 12.0–46.0)
Lymphs Abs: 2.2 10*3/uL (ref 0.7–4.0)
MCHC: 32.1 g/dL (ref 30.0–36.0)
MCV: 82.9 fl (ref 78.0–100.0)
Monocytes Absolute: 0.4 10*3/uL (ref 0.1–1.0)
Monocytes Relative: 4.4 % (ref 3.0–12.0)
Neutro Abs: 6.5 10*3/uL (ref 1.4–7.7)
Neutrophils Relative %: 68.7 % (ref 43.0–77.0)
Platelets: 248 10*3/uL (ref 150.0–400.0)
RBC: 4.96 Mil/uL (ref 3.87–5.11)
RDW: 20.6 % — ABNORMAL HIGH (ref 11.5–15.5)
WBC: 9.4 10*3/uL (ref 4.0–10.5)

## 2020-04-26 LAB — URINALYSIS, ROUTINE W REFLEX MICROSCOPIC
Bilirubin Urine: NEGATIVE
Hgb urine dipstick: NEGATIVE
Ketones, ur: NEGATIVE
Leukocytes,Ua: NEGATIVE
Nitrite: NEGATIVE
RBC / HPF: NONE SEEN
Specific Gravity, Urine: 1.02 (ref 1.000–1.030)
Total Protein, Urine: NEGATIVE
Urine Glucose: NEGATIVE
Urobilinogen, UA: 0.2 (ref 0.0–1.0)
pH: 5.5 (ref 5.0–8.0)

## 2020-04-26 MED ORDER — SODIUM CHLORIDE 0.9 % IV SOLN: 500.0000 mL | Freq: Once | INTRAVENOUS | Status: DC

## 2020-04-26 NOTE — Progress Notes (Signed)
Called to room to assist during endoscopic procedure.  Patient ID and intended procedure confirmed with present staff. Received instructions for my participation in the procedure from the performing physician.  

## 2020-04-26 NOTE — Op Note (Signed)
[facility] Patient Name: Audrey Jenkins Procedure Date: 04/26/2020 9:56 AM MRN: [id number] Endoscopist: Thornton Park MD, MD Age: 64 Referring MD:  Date of Birth: September 08, 1955 Gender: Female Account #: [id number] Procedure:                Upper GI endoscopy Indications:              Unexplained iron deficiency anemia Medicines:                Monitored Anesthesia Care Procedure:                Pre-Anesthesia Assessment:                           - Prior to the procedure, a History and Physical                            was performed, and patient medications and                            allergies were reviewed. The patient's tolerance of                            previous anesthesia was also reviewed. The risks                            and benefits of the procedure and the sedation                            options and risks were discussed with the patient.                            All questions were answered, and informed consent                            was obtained. Prior Anticoagulants: The patient has                            taken no previous anticoagulant or antiplatelet                            agents. ASA Grade Assessment: III - A patient with                            severe systemic disease. After reviewing the risks                            and benefits, the patient was deemed in                            satisfactory condition to undergo the procedure.                           After obtaining informed consent, the endoscope was  passed under direct vision. Throughout the                            procedure, the patient's blood pressure, pulse, and                            oxygen saturations were monitored continuously. The                            Endoscope was introduced through the mouth, and                            advanced to the second part of duodenum. The upper                            GI endoscopy was  accomplished without difficulty.                            The patient tolerated the procedure well. Scope In: Scope Out: Findings:                 The examined esophagus was normal. Biopsies were                            taken with a cold forceps for histology. Estimated                            blood loss was minimal.                           Localized erythematous mucosa was found in the                            gastric fundus. This was must likely due to scope                            trauma with advance of the gastroscope through the                            hernia. Estimated blood loss was minimal.                           A medium-sized hiatal hernia was present. No                            Cameron's ulcers seen.                           The examined duodenum was normal. Biopsies were                            taken with a cold forceps for histology. Estimated                            blood  loss was minimal.                           The cardia and gastric fundus were normal on                            retroflexion.                           The exam was otherwise without abnormality. Complications:            No immediate complications. Estimated blood loss:                            Minimal. Estimated Blood Loss:     Estimated blood loss was minimal. Impression:               - Normal esophagus. Biopsied.                           - Erythematous mucosa in the gastric fundus.                           - Medium-sized hiatal hernia.                           - Normal examined duodenum. Biopsied.                           - The examination was otherwise normal. No source                            of anemia identified. Await duodenal biopsy results. Recommendation:           - Patient has a contact number available for                            emergencies. The signs and symptoms of potential                            delayed complications were discussed  with the                            patient. Return to normal activities tomorrow.                            Written discharge instructions were provided to the                            patient.                           - Resume previous diet.                           - Continue present medications.                           -  No aspirin, ibuprofen, naproxen, or other                            non-steroidal anti-inflammatory drugs.                           - Await pathology results. Thornton Park MD, MD 04/26/2020 10:54:48 AM This report has been signed electronically.

## 2020-04-26 NOTE — Progress Notes (Signed)
pt tolerated well. VSS. awake and to recovery. Report given to RN.  

## 2020-04-26 NOTE — Op Note (Signed)
[facility] Patient Name: Audrey Jenkins Procedure Date: 04/26/2020 9:56 AM MRN: [id number] Endoscopist: Thornton Park MD, MD Age: 64 Referring MD:  Date of Birth: 1956/01/28 Gender: Female Account #: [id number] Procedure:                Colonoscopy Indications:              Unexplained iron deficiency anemia                           Normal colonoscopy with Dr. Olevia Perches 2010 Medicines:                Monitored Anesthesia Care Procedure:                Pre-Anesthesia Assessment:                           - Prior to the procedure, a History and Physical                            was performed, and patient medications and                            allergies were reviewed. The patient's tolerance of                            previous anesthesia was also reviewed. The risks                            and benefits of the procedure and the sedation                            options and risks were discussed with the patient.                            All questions were answered, and informed consent                            was obtained. Prior Anticoagulants: The patient has                            taken no previous anticoagulant or antiplatelet                            agents. ASA Grade Assessment: III - A patient with                            severe systemic disease. After reviewing the risks                            and benefits, the patient was deemed in                            satisfactory condition to undergo the procedure.  After obtaining informed consent, the colonoscope                            was passed under direct vision. Throughout the                            procedure, the patient's blood pressure, pulse, and                            oxygen saturations were monitored continuously. The                            Colonoscope was introduced through the anus and                            advanced to the 3 cm into the  ileum. A second                            forward view of the right colon was performed. The                            colonoscopy was performed without difficulty. The                            patient tolerated the procedure well. The quality                            of the bowel preparation was good. The terminal                            ileum, ileocecal valve, appendiceal orifice, and                            rectum were photographed. Scope In: 10:25:25 AM Scope Out: 10:45:45 AM Scope Withdrawal Time: 0 hours 18 minutes 52 seconds  Total Procedure Duration: 0 hours 20 minutes 20 seconds  Findings:                 The perianal and digital rectal examinations were                            normal.                           Multiple small and large-mouthed diverticula were                            found in the sigmoid colon and descending colon.                           A 10 mm polyp was found in the descending colon                            located 48 cm from the anal verge. The polyp was  flat. The polyp was removed with a cold snare.                            Resection and retrieval were complete. Estimated                            blood loss was minimal.                           Seven sessile polyps were found in the rectum                            (33mm), descending colon (24mm, 29mm, 55mm), ascending                            colon (17mm, 53mm) and ileocecal valve (41mm). The                            polyps were 1 to 4 mm in size. These polyps were                            removed with a cold snare. Resection and retrieval                            were complete. Estimated blood loss was minimal.                           The exam was otherwise without abnormality on                            direct and retroflexion views. Complications:            No immediate complications. Estimated blood loss:                             Minimal. Estimated Blood Loss:     Estimated blood loss was minimal. Impression:               - Diverticulosis in the sigmoid colon and in the                            descending colon.                           - One 10 mm polyp in the descending colon, removed                            with a cold snare. Resected and retrieved.                           - Seven 1 to 4 mm polyps in the rectum, in the                            descending colon, in the ascending colon  and at the                            ileocecal valve, removed with a cold snare.                            Resected and retrieved.                           - These polyps are small and are unllikely to be                            the cause of anemia. Recommendation:           - Patient has a contact number available for                            emergencies. The signs and symptoms of potential                            delayed complications were discussed with the                            patient. Return to normal activities tomorrow.                            Written discharge instructions were provided to the                            patient.                           - Follow a high fiber diet. Drink at least 64                            ounces of water daily. Add a daily stool bulking                            agent such as psyllium (an exampled would be                            Metamucil).                           - Continue present medications.                           - Await pathology results.                           - Repeat colonoscopy date to be determined after                            pending pathology results are reviewed for  surveillance.                           - Emerging evidence supports eating a diet of                            fruits, vegetables, grains, calcium, and yogurt                            while reducing red meat and alcohol may reduce  the                            risk of colon cancer.                           - Given these results, all first degree relatives                            (brothers, sisters, children, parents) should start                            colon cancer screening at age 63.                           - Thank you for allowing me to be involved in your                            colon cancer prevention. Thornton Park MD, MD 04/26/2020 11:02:12 AM This report has been signed electronically.

## 2020-04-26 NOTE — Progress Notes (Signed)
Vitals by AG 

## 2020-04-26 NOTE — Progress Notes (Signed)
Anemia much better    urine is clear  of blood

## 2020-04-26 NOTE — Patient Instructions (Signed)
Handouts given:  Hiatal Hernia, Diverticulosis, Polyps, High Fiber Diet Start to follow a high fiber diet Drink at least 64 oz water daily Add metamucil daily to your diet Continue present medications Await pathology results    YOU HAD AN ENDOSCOPIC PROCEDURE TODAY AT [place] [facility]:   Refer to the procedure report that was given to you for any specific questions about what was found during the examination.  If the procedure report does not answer your questions, please call your gastroenterologist to clarify.  If you requested that your care partner not be given the details of your procedure findings, then the procedure report has been included in a sealed envelope for you to review at your convenience later.  YOU SHOULD EXPECT: Some feelings of bloating in the abdomen. Passage of more gas than usual.  Walking can help get rid of the air that was put into your GI tract during the procedure and reduce the bloating. If you had a lower endoscopy (such as a colonoscopy or flexible sigmoidoscopy) you may notice spotting of blood in your stool or on the toilet paper. If you underwent a bowel prep for your procedure, you may not have a normal bowel movement for a few days.  Please Note:  You might notice some irritation and congestion in your nose or some drainage.  This is from the oxygen used during your procedure.  There is no need for concern and it should clear up in a day or so.  SYMPTOMS TO REPORT IMMEDIATELY:   Following lower endoscopy (colonoscopy or flexible sigmoidoscopy):  Excessive amounts of blood in the stool  Significant tenderness or worsening of abdominal pains  Swelling of the abdomen that is new, acute  Fever of 100F or higher   Following upper endoscopy (EGD)  Vomiting of blood or coffee ground material  New chest pain or pain under the shoulder blades  Painful or persistently difficult swallowing  New shortness of breath  Fever of 100F or higher  Black,  tarry-looking stools  For urgent or emergent issues, a gastroenterologist can be reached at any hour by calling [phone]. Do not use MyChart messaging for urgent concerns.    DIET:  We do recommend a small meal at first, but then you may proceed to your regular diet.  Drink plenty of fluids but you should avoid alcoholic beverages for 24 hours.  ACTIVITY:  You should plan to take it easy for the rest of today and you should NOT DRIVE or use heavy machinery until tomorrow (because of the sedation medicines used during the test).    FOLLOW UP: Our staff will call the number listed on your records 48-72 hours following your procedure to check on you and address any questions or concerns that you may have regarding the information given to you following your procedure. If we do not reach you, we will leave a message.  We will attempt to reach you two times.  During this call, we will ask if you have developed any symptoms of COVID 19. If you develop any symptoms (ie: fever, flu-like symptoms, shortness of breath, cough etc.) before then, please call [phone].  If you test positive for Covid 19 in the 2 weeks post procedure, please call and report this information to [place].    If any biopsies were taken you will be contacted by phone or by letter within the next 1-3 weeks.  Please call us at [phone] if you have not heard about  the biopsies in 3 weeks.    SIGNATURES/CONFIDENTIALITY: You and/or your care partner have signed paperwork which will be entered into your electronic medical record.  These signatures attest to the fact that that the information above on your After Visit Summary has been reviewed and is understood.  Full responsibility of the confidentiality of this discharge information lies with you and/or your care-partner.YOU HAD AN ENDOSCOPIC PROCEDURE TODAY AT [facility]:   Refer to the procedure report that was given to you for any specific questions about  what was found during the examination.  If the procedure report does not answer your questions, please call your gastroenterologist to clarify.  If you requested that your care partner not be given the details of your procedure findings, then the procedure report has been included in a sealed envelope for you to review at your convenience later.  YOU SHOULD EXPECT: Some feelings of bloating in the abdomen. Passage of more gas than usual.  Walking can help get rid of the air that was put into your GI tract during the procedure and reduce the bloating. If you had a lower endoscopy (such as a colonoscopy or flexible sigmoidoscopy) you may notice spotting of blood in your stool or on the toilet paper. If you underwent a bowel prep for your procedure, you may not have a normal bowel movement for a few days.  Please Note:  You might notice some irritation and congestion in your nose or some drainage.  This is from the oxygen used during your procedure.  There is no need for concern and it should clear up in a day or so.  SYMPTOMS TO REPORT IMMEDIATELY:   Following lower endoscopy (colonoscopy or flexible sigmoidoscopy):  Excessive amounts of blood in the stool  Significant tenderness or worsening of abdominal pains  Swelling of the abdomen that is new, acute  Fever of 100F or higher   Following upper endoscopy (EGD)  Vomiting of blood or coffee ground material  New chest pain or pain under the shoulder blades  Painful or persistently difficult swallowing  New shortness of breath  Fever of 100F or higher  Black, tarry-looking stools  For urgent or emergent issues, a gastroenterologist can be reached at any hour by calling [phone]. Do not use MyChart messaging for urgent concerns.    DIET:  We do recommend a small meal at first, but then you may proceed to your regular diet.  Drink plenty of fluids but you should avoid alcoholic beverages for 24 hours.  ACTIVITY:  You should plan to  take it easy for the rest of today and you should NOT DRIVE or use heavy machinery until tomorrow (because of the sedation medicines used during the test).    FOLLOW UP: Our staff will call the number listed on your records 48-72 hours following your procedure to check on you and address any questions or concerns that you may have regarding the information given to you following your procedure. If we do not reach you, we will leave a message.  We will attempt to reach you two times.  During this call, we will ask if you have developed any symptoms of COVID 19. If you develop any symptoms (ie: fever, flu-like symptoms, shortness of breath, cough etc.) before then, please call [phone].  If you test positive for Covid 19 in the 2 weeks post procedure, please call and report this information to [place].    If any biopsies were taken you  will be contacted by phone or by letter within the next 1-3 weeks.  Please call us at [phone] if you have not heard about the biopsies in 3 weeks.    SIGNATURES/CONFIDENTIALITY: You and/or your care partner have signed paperwork which will be entered into your electronic medical record.  These signatures attest to the fact that that the information above on your After Visit Summary has been reviewed and is understood.  Full responsibility of the confidentiality of this discharge information lies with you and/or your care-partner.

## 2020-04-28 ENCOUNTER — Ambulatory Visit (INDEPENDENT_AMBULATORY_CARE_PROVIDER_SITE_OTHER): Payer: BC Managed Care – PPO

## 2020-04-28 ENCOUNTER — Telehealth: Payer: Self-pay

## 2020-04-28 DIAGNOSIS — J309 Allergic rhinitis, unspecified: Secondary | ICD-10-CM | POA: Diagnosis not present

## 2020-04-28 NOTE — Telephone Encounter (Signed)
°  Follow up Call-  Call back number 04/26/2020  Post procedure Call Back phone  # [phone]  Permission to leave phone message Yes  Some recent data might be hidden     Patient questions:  Do you have a fever, pain , or abdominal swelling? No. Pain Score  0 *  Have you tolerated food without any problems? Yes.    Have you been able to return to your normal activities? Yes.    Do you have any questions about your discharge instructions: Diet   No. Medications  No. Follow up visit  No.  Do you have questions or concerns about your Care? No.  Actions: * If pain score is 4 or above: 1. No action needed, pain <4.Have you developed a fever since your procedure? no  2.   Have you had an respiratory symptoms (SOB or cough) since your procedure? no  3.   Have you tested positive for COVID 19 since your procedure no  4.   Have you had any family members/close contacts diagnosed with the COVID 19 since your procedure?  no   If yes to any of these questions please route to Joylene John, RN and Joella Prince, RN

## 2020-05-07 ENCOUNTER — Ambulatory Visit: Payer: BC Managed Care – PPO | Admitting: Allergy

## 2020-05-07 ENCOUNTER — Other Ambulatory Visit: Payer: Self-pay

## 2020-05-07 ENCOUNTER — Ambulatory Visit: Payer: Self-pay | Admitting: *Deleted

## 2020-05-07 ENCOUNTER — Encounter: Payer: Self-pay | Admitting: Allergy

## 2020-05-07 VITALS — BP 122/84 | HR 68 | Temp 98.1°F | Resp 16 | Ht 64.0 in | Wt 230.0 lb

## 2020-05-07 DIAGNOSIS — J302 Other seasonal allergic rhinitis: Secondary | ICD-10-CM | POA: Diagnosis not present

## 2020-05-07 DIAGNOSIS — H1010 Acute atopic conjunctivitis, unspecified eye: Secondary | ICD-10-CM | POA: Diagnosis not present

## 2020-05-07 DIAGNOSIS — J3089 Other allergic rhinitis: Secondary | ICD-10-CM

## 2020-05-07 DIAGNOSIS — J309 Allergic rhinitis, unspecified: Secondary | ICD-10-CM

## 2020-05-07 MED ORDER — EPINEPHRINE 0.3 MG/0.3ML IJ SOAJ: 0.3000 mg | INTRAMUSCULAR | 1 refills | Status: DC | PRN

## 2020-05-07 MED ORDER — MONTELUKAST SODIUM 10 MG PO TABS
ORAL_TABLET | ORAL | 2 refills | Status: DC
Start: 2020-05-07 — End: 2020-11-29

## 2020-05-07 NOTE — Assessment & Plan Note (Signed)
Past history -2019 skin testing was positive to cat, grass, weed, tree, mold, dog, dust mites. Started AIT on 05/15/2019 (M-DM & G-W-T-C-D) Interim history - doing well with below regimen. Improvement noted in the spring which is the worst season for her.   Continue environmental control measures.  Continue allergy injections.  You may slowly wean off the following medications:  May use Flonase (fluticasone) nasal spray 1 spray per nostril twice a day as needed for nasal congestion.   May use Singulair (montelukast) 10mg  once daily.  May use Pataday (olopatadine 0.2%) one drop per eye once daily as needed for itchy/watery eyes.  Nasal saline spray (i.e., Simply Saline) or nasal saline lavage (i.e., NeilMed) is recommended as needed and prior to medicated nasal sprays.   May use over the counter antihistamines such as Zyrtec (cetirizine), Claritin (loratadine), Allegra (fexofenadine), or Xyzal (levocetirizine) daily as needed.  During the spring months if allergies flare may take the above medications on a daily basis if needed.

## 2020-05-07 NOTE — Patient Instructions (Addendum)
Seasonal and perennial allergic rhinoconjunctivitis Past history -2019 skin testing was positive to cat, grass, weed, tree, mold, dog, dust mites.  Continue environmental control measures.  Continue allergy injections.  You may slowly wean off the following medications:  May use Flonase (fluticasone) nasal spray 1 spray per nostril twice a day as needed for nasal congestion.   May use Singulair (montelukast) 10mg  once daily.  May use Pataday (olopatadine 0.2%) one drop per eye once daily as needed for itchy/watery eyes.  Nasal saline spray (i.e., Simply Saline) or nasal saline lavage (i.e., NeilMed) is recommended as needed and prior to medicated nasal sprays.   May use over the counter antihistamines such as Zyrtec (cetirizine), Claritin (loratadine), Allegra (fexofenadine), or Xyzal (levocetirizine) daily as needed.  During the spring months if you notice your allergies flare then you may take the above medications on a daily basis if needed.   Follow up in 7 months or sooner if needed.    After September 2021, I will not be in the [facility] office on a regular basis.   You can continue your care and follow up with Dr. Verlin Fester or nurse practitioner Gareth Morgan in [facility].  OR you can follow up with me in our [place] ([address]) or [employer] [place] [phone]) location.  Sincerely,  Rexene Alberts, DO  [facility] office: [phone] [facility] office: [place] office: [phone]   Reducing Pollen Exposure . Pollen seasons: trees (spring), grass (summer) and ragweed/weeds (fall). Marland Kitchen Keep windows closed in your home and car to lower pollen exposure.  Susa Simmonds air conditioning in the bedroom and throughout the house if possible.  . Avoid going out in dry windy days - especially early morning. . Pollen counts are highest between 5 - 10 AM and on dry, hot and windy days.  . Save outside activities for  late afternoon or after a heavy rain, when pollen levels are lower.  . Avoid mowing of grass if you have grass pollen allergy. Marland Kitchen Be aware that pollen can also be transported indoors on people and pets.  . Dry your clothes in an automatic dryer rather than hanging them outside where they might collect pollen.  . Rinse hair and eyes before bedtime. Pet Allergen Avoidance: . Contrary to popular opinion, there are no "hypoallergenic" breeds of dogs or cats. That is because people are not allergic to an animal's hair, but to an allergen found in the animal's saliva, dander (dead skin flakes) or urine. Pet allergy symptoms typically occur within minutes. For some people, symptoms can build up and become most severe 8 to 12 hours after contact with the animal. People with severe allergies can experience reactions in public places if dander has been transported on the pet owners' clothing. Marland Kitchen Keeping an animal outdoors is only a partial solution, since homes with pets in the yard still have higher concentrations of animal allergens. . Before getting a pet, ask your allergist to determine if you are allergic to animals. If your pet is already considered part of your family, try to minimize contact and keep the pet out of the bedroom and other rooms where you spend a great deal of time. . As with dust mites, vacuum carpets often or replace carpet with a hardwood floor, tile or linoleum. . High-efficiency particulate air (HEPA) cleaners can reduce allergen levels over time. . While dander and saliva are the source of cat and  dog allergens, urine is the source of allergens from rabbits, hamsters, mice and [place] pigs; so ask a non-allergic family member to clean the animal's cage. . If you have a pet allergy, talk to your allergist about the potential for allergy immunotherapy (allergy shots). This strategy can often provide long-term relief. Control of House Dust Mite Allergen . Dust mite allergens are a common  trigger of allergy and asthma symptoms. While they can be found throughout the house, these microscopic creatures thrive in warm, humid environments such as bedding, upholstered furniture and carpeting. . Because so much time is spent in the bedroom, it is essential to reduce mite levels there.  . Encase pillows, mattresses, and box springs in special allergen-proof fabric covers or airtight, zippered plastic covers.  . Bedding should be washed weekly in hot water (130 F) and dried in a hot dryer. Allergen-proof covers are available for comforters and pillows that can't be regularly washed.  Wendee Copp the allergy-proof covers every few months. Minimize clutter in the bedroom. Keep pets out of the bedroom.  Marland Kitchen Keep humidity less than 50% by using a dehumidifier or air conditioning. You can buy a humidity measuring device called a hygrometer to monitor this.  . If possible, replace carpets with hardwood, linoleum, or washable area rugs. If that's not possible, vacuum frequently with a vacuum that has a HEPA filter. . Remove all upholstered furniture and non-washable window drapes from the bedroom. . Remove all non-washable stuffed toys from the bedroom.  Wash stuffed toys weekly. Mold Control . Mold and fungi can grow on a variety of surfaces provided certain temperature and moisture conditions exist.  . Outdoor molds grow on plants, decaying vegetation and soil. The major outdoor mold, Alternaria and Cladosporium, are found in very high numbers during hot and dry conditions. Generally, a late summer - fall peak is seen for common outdoor fungal spores. Rain will temporarily lower outdoor mold spore count, but counts rise rapidly when the rainy period ends. . The most important indoor molds are Aspergillus and Penicillium. Dark, humid and poorly ventilated basements are ideal sites for mold growth. The next most common sites of mold growth are the bathroom and the kitchen. Outdoor (Seasonal) Mold  Control . Use air conditioning and keep windows closed. . Avoid exposure to decaying vegetation. Marland Kitchen Avoid leaf raking. . Avoid grain handling. . Consider wearing a face mask if working in moldy areas.  Indoor (Perennial) Mold Control  . Maintain humidity below 50%. . Get rid of mold growth on hard surfaces with water, detergent and, if necessary, 5% bleach (do not mix with other cleaners). Then dry the area completely. If mold covers an area more than 10 square feet, consider hiring an indoor environmental professional. . For clothing, washing with soap and water is best. If moldy items cannot be cleaned and dried, throw them away. . Remove sources e.g. contaminated carpets. . Repair and seal leaking roofs or pipes. Using dehumidifiers in damp basements may be helpful, but empty the water and clean units regularly to prevent mildew from forming. All rooms, especially basements, bathrooms and kitchens, require ventilation and cleaning to deter mold and mildew growth. Avoid carpeting on concrete or damp floors, and storing items in damp areas.

## 2020-05-07 NOTE — Progress Notes (Signed)
Follow Up Note  RE: Audrey Jenkins MRN: [id number] DOB: Jun 07, 1956 Date of Office Visit: 05/07/2020  Referring provider: Burnis Medin, MD Primary care provider: Burnis Medin, MD  Chief Complaint: Allergic Rhinitis   History of Present Illness: I had the pleasure of seeing Audrey Jenkins for a follow up visit at the [facility] [place] [facility] [place] on 05/07/2020. She is a 64 y.o. female, who is being followed for allergic rhino conjunctivitis on AIT. Her previous allergy office visit was on 10/31/2019 with Dr. Maudie Mercury. Today is a regular follow up visit. Up to date with COVID-19 vaccine: yes  Seasonal and perennial allergic rhinoconjunctivitis Patient is currently on allergy injections and doing well on it. Noticed improvement in her symptoms in the spring.   Currently on Flonase 2 sprays per nostril daily, Singulair QHS and sometimes uses pataday eye drops as needed with good benefit. Sometimes takes additional OTC antihistamines as needed for rhinorrhea.   No nosebleeds.  Patient was diagnosed with anemia which is doing better and takes iron pills.   Assessment and Plan: Audrey Jenkins is a 64 y.o. female with: Seasonal and perennial allergic rhinoconjunctivitis Past history -2019 skin testing was positive to cat, grass, weed, tree, mold, dog, dust mites. Started AIT on 05/15/2019 (M-DM & G-W-T-C-D) Interim history - doing well with below regimen. Improvement noted in the spring which is the worst season for her.   Continue environmental control measures.  Continue allergy injections.  You may slowly wean off the following medications:  May use Flonase (fluticasone) nasal spray 1 spray per nostril twice a day as needed for nasal congestion.   May use Singulair (montelukast) 10mg  once daily.  May use Pataday (olopatadine 0.2%) one drop per eye once daily as needed for itchy/watery eyes.  Nasal saline spray (i.e., Simply Saline) or nasal saline lavage (i.e., NeilMed) is recommended as  needed and prior to medicated nasal sprays.   May use over the counter antihistamines such as Zyrtec (cetirizine), Claritin (loratadine), Allegra (fexofenadine), or Xyzal (levocetirizine) daily as needed.  During the spring months if allergies flare may take the above medications on a daily basis if needed.   Return in about 7 months (around 12/05/2020).  Meds ordered this encounter  Medications  . montelukast (SINGULAIR) 10 MG tablet    Sig: Take 1 tablet once at night for coughing or wheezing    Dispense:  90 tablet    Refill:  2  . EPINEPHrine (AUVI-Q) 0.3 mg/0.3 mL IJ SOAJ injection    Sig: Inject 0.3 mg into the muscle as needed for anaphylaxis.    Dispense:  1 each    Refill:  1   Diagnostics: None.   Medication List:  Current Outpatient Medications  Medication Sig Dispense Refill  . acetaminophen (TYLENOL) 500 MG tablet Take 500-1,000 mg by mouth every 6 (six) hours as needed.    Marland Kitchen atorvastatin (LIPITOR) 80 MG tablet Take 1 tablet (80 mg total) by mouth daily. 90 tablet 0  . eletriptan (RELPAX) 40 MG tablet TAKE 1 TABLET BY MOUTH AS NEEDED FOR MIGRAINE OR HEADACHE. MAY REPEAT IN 2 HOURS IF NEEDED 9 tablet 1  . EPINEPHrine (AUVI-Q) 0.3 mg/0.3 mL IJ SOAJ injection Inject 0.3 mg into the muscle as needed for anaphylaxis. 1 each 1  . escitalopram (LEXAPRO) 10 MG tablet Take 1 tablet (10 mg total) by mouth daily. 90 tablet 1  . estradiol (ESTRACE) 0.1 MG/GM vaginal cream Place 1 Applicatorful vaginally 3 (three) times a  week. PRN. Schedule follow up for further refills. [phone] 42.5 g 0  . Ferrous Sulfate 142 (45 Fe) MG TBCR Take 1 tablet by mouth daily.    . fexofenadine (ALLEGRA) 180 MG tablet Take 180 mg by mouth daily.    . fluticasone (FLONASE) 50 MCG/ACT nasal spray USE 1-2 SPRAYS PER NOSTRIL FOR SINUS ISSUES. 48 mL 2  . montelukast (SINGULAIR) 10 MG tablet Take 1 tablet once at night for coughing or wheezing 90 tablet 2  . Olopatadine HCl 0.2 % SOLN Apply 2 drops to  eye daily as needed. 2.5 mL 5  . VITAMIN D PO Take by mouth.     No current facility-administered medications for this visit.   Allergies: Allergies  Allergen Reactions  . Cetirizine Hcl     REACTION: flu symptoms  . Codeine   . Pravastatin Sodium     REACTION: Diarrhea  . Propoxyphene N-Acetaminophen     REACTION: extreme nausea  . Rhinocort [Budesonide]     Nose sores  . Zetia [Ezetimibe]     Body aches, and "sick feeling"   I reviewed her past medical history, social history, family history, and environmental history and no significant changes have been reported from her previous visit.  Review of Systems  Constitutional: Negative for appetite change, chills, fever and unexpected weight change.  HENT: Negative for congestion and rhinorrhea.   Eyes: Negative for itching.  Respiratory: Negative for cough, chest tightness, shortness of breath and wheezing.   Gastrointestinal: Negative for abdominal pain.  Skin: Negative for rash.  Allergic/Immunologic: Positive for environmental allergies.  Neurological: Negative for headaches.   Objective: BP 122/84 (BP Location: Right Arm, Patient Position: Sitting, Cuff Size: Small)   Pulse 68   Temp 98.1 F (36.7 C) (Oral)   Resp 16   Ht 5\' 4"  (1.626 m)   Wt 230 lb (104.3 kg)   SpO2 98%   BMI 39.48 kg/m  Body mass index is 39.48 kg/m. Physical Exam Vitals and nursing note reviewed.  Constitutional:      Appearance: Normal appearance. She is well-developed.  HENT:     Head: Normocephalic and atraumatic.     Right Ear: Tympanic membrane and external ear normal.     Left Ear: Tympanic membrane and external ear normal.     Nose: Nose normal.     Mouth/Throat:     Mouth: Mucous membranes are moist.     Pharynx: Oropharynx is clear.  Eyes:     Conjunctiva/sclera: Conjunctivae normal.  Cardiovascular:     Rate and Rhythm: Normal rate and regular rhythm.     Heart sounds: Normal heart sounds. No murmur heard.  No friction  rub. No gallop.   Pulmonary:     Effort: Pulmonary effort is normal.     Breath sounds: Normal breath sounds. No wheezing, rhonchi or rales.  Musculoskeletal:     Cervical back: Neck supple.  Skin:    General: Skin is warm.     Findings: No rash.  Neurological:     Mental Status: She is alert and oriented to person, place, and time.  Psychiatric:        Behavior: Behavior normal.    Previous notes and tests were reviewed. The plan was reviewed with the patient/family, and all questions/concerned were addressed.  It was my pleasure to see Audrey Jenkins today and participate in her care. Please feel free to contact me with any questions or concerns.  Sincerely,  Rexene Alberts, DO Allergy & Immunology  [facility] office: [phone] [facility] office: [place] office: [phone]

## 2020-05-10 ENCOUNTER — Other Ambulatory Visit: Payer: Self-pay

## 2020-05-10 ENCOUNTER — Encounter: Payer: Self-pay | Admitting: Gastroenterology

## 2020-05-10 ENCOUNTER — Other Ambulatory Visit: Payer: BC Managed Care – PPO

## 2020-05-10 DIAGNOSIS — D509 Iron deficiency anemia, unspecified: Secondary | ICD-10-CM

## 2020-05-10 MED ORDER — PANTOPRAZOLE SODIUM 40 MG PO TBEC: 40.0000 mg | DELAYED_RELEASE_TABLET | Freq: Every day | ORAL | 3 refills | Status: DC

## 2020-05-10 MED ORDER — PANTOPRAZOLE SODIUM 40 MG PO TBEC: 40.0000 mg | DELAYED_RELEASE_TABLET | Freq: Every day | ORAL | 2 refills | Status: DC

## 2020-05-14 ENCOUNTER — Ambulatory Visit (INDEPENDENT_AMBULATORY_CARE_PROVIDER_SITE_OTHER): Payer: BC Managed Care – PPO

## 2020-05-14 DIAGNOSIS — J309 Allergic rhinitis, unspecified: Secondary | ICD-10-CM | POA: Diagnosis not present

## 2020-05-18 ENCOUNTER — Ambulatory Visit (INDEPENDENT_AMBULATORY_CARE_PROVIDER_SITE_OTHER): Payer: BC Managed Care – PPO | Admitting: Gastroenterology

## 2020-05-18 ENCOUNTER — Ambulatory Visit (INDEPENDENT_AMBULATORY_CARE_PROVIDER_SITE_OTHER): Payer: BC Managed Care – PPO

## 2020-05-18 ENCOUNTER — Encounter: Payer: Self-pay | Admitting: Gastroenterology

## 2020-05-18 DIAGNOSIS — D500 Iron deficiency anemia secondary to blood loss (chronic): Secondary | ICD-10-CM | POA: Diagnosis not present

## 2020-05-18 DIAGNOSIS — J309 Allergic rhinitis, unspecified: Secondary | ICD-10-CM

## 2020-05-18 NOTE — Progress Notes (Signed)
SN: [id number] Exp: 2021-09-16 LOT: 09-18-23  Patient arrived for Capsule Endoscopy. Reported the prep went well. explained capsule retrieval process and dietary restrictions for the next few hours. Patient verbalized understanding. Opened capsule, ensured capsule was flashing prior to the patient swallowing the capsule. Patient swallowed capsule without difficulty. Patient given retrieval supplies. Patient told to call the office with any questions and if no capsule was retrieved after 72 hours. No further questions by the conclusion of the visit

## 2020-05-18 NOTE — Patient Instructions (Signed)

## 2020-05-25 ENCOUNTER — Ambulatory Visit (INDEPENDENT_AMBULATORY_CARE_PROVIDER_SITE_OTHER): Payer: BC Managed Care – PPO

## 2020-05-25 DIAGNOSIS — J309 Allergic rhinitis, unspecified: Secondary | ICD-10-CM | POA: Diagnosis not present

## 2020-06-01 NOTE — Addendum Note (Signed)
Addended by: Lucio Edward T on: 06/01/2020 08:44 AM   Modules accepted: Level of Service

## 2020-06-03 ENCOUNTER — Ambulatory Visit (INDEPENDENT_AMBULATORY_CARE_PROVIDER_SITE_OTHER): Payer: BC Managed Care – PPO

## 2020-06-03 DIAGNOSIS — J309 Allergic rhinitis, unspecified: Secondary | ICD-10-CM

## 2020-06-04 ENCOUNTER — Telehealth: Payer: Self-pay | Admitting: Gastroenterology

## 2020-06-04 NOTE — Telephone Encounter (Signed)
Please call the patient.  I reviewed the results from her capsule endoscopy study performed 05/18/2020.  It is essentially normal.  There were no findings to explain iron deficiency anemia.  Given these results she should continue taking iron supplements.  Please arrange office follow-up with me or Colleen for routine ongoing care.  Thank you.

## 2020-06-04 NOTE — Telephone Encounter (Signed)
Pt notified via mychart. Pt scheduled to see Dr. Tarri Glenn 07/27/20 at 8:50am.

## 2020-06-09 ENCOUNTER — Ambulatory Visit (INDEPENDENT_AMBULATORY_CARE_PROVIDER_SITE_OTHER): Payer: BC Managed Care – PPO

## 2020-06-09 DIAGNOSIS — J309 Allergic rhinitis, unspecified: Secondary | ICD-10-CM

## 2020-06-09 NOTE — Progress Notes (Signed)
Vials exp 06-10-21

## 2020-06-10 DIAGNOSIS — J3081 Allergic rhinitis due to animal (cat) (dog) hair and dander: Secondary | ICD-10-CM | POA: Diagnosis not present

## 2020-06-17 ENCOUNTER — Ambulatory Visit (INDEPENDENT_AMBULATORY_CARE_PROVIDER_SITE_OTHER): Payer: BC Managed Care – PPO

## 2020-06-17 DIAGNOSIS — J309 Allergic rhinitis, unspecified: Secondary | ICD-10-CM

## 2020-06-24 ENCOUNTER — Ambulatory Visit (INDEPENDENT_AMBULATORY_CARE_PROVIDER_SITE_OTHER): Payer: BC Managed Care – PPO

## 2020-06-24 DIAGNOSIS — J309 Allergic rhinitis, unspecified: Secondary | ICD-10-CM | POA: Diagnosis not present

## 2020-06-29 ENCOUNTER — Ambulatory Visit (INDEPENDENT_AMBULATORY_CARE_PROVIDER_SITE_OTHER): Payer: BC Managed Care – PPO

## 2020-06-29 DIAGNOSIS — J309 Allergic rhinitis, unspecified: Secondary | ICD-10-CM | POA: Diagnosis not present

## 2020-07-04 ENCOUNTER — Other Ambulatory Visit: Payer: Self-pay | Admitting: Internal Medicine

## 2020-07-06 ENCOUNTER — Ambulatory Visit (INDEPENDENT_AMBULATORY_CARE_PROVIDER_SITE_OTHER): Payer: BC Managed Care – PPO | Admitting: *Deleted

## 2020-07-06 DIAGNOSIS — J309 Allergic rhinitis, unspecified: Secondary | ICD-10-CM | POA: Diagnosis not present

## 2020-07-14 ENCOUNTER — Ambulatory Visit (INDEPENDENT_AMBULATORY_CARE_PROVIDER_SITE_OTHER): Payer: BC Managed Care – PPO | Admitting: *Deleted

## 2020-07-14 DIAGNOSIS — J309 Allergic rhinitis, unspecified: Secondary | ICD-10-CM

## 2020-07-21 ENCOUNTER — Ambulatory Visit (INDEPENDENT_AMBULATORY_CARE_PROVIDER_SITE_OTHER): Payer: BC Managed Care – PPO

## 2020-07-21 DIAGNOSIS — J309 Allergic rhinitis, unspecified: Secondary | ICD-10-CM | POA: Diagnosis not present

## 2020-07-27 ENCOUNTER — Ambulatory Visit: Payer: BC Managed Care – PPO | Admitting: Gastroenterology

## 2020-07-27 ENCOUNTER — Ambulatory Visit (INDEPENDENT_AMBULATORY_CARE_PROVIDER_SITE_OTHER): Payer: BC Managed Care – PPO

## 2020-07-27 DIAGNOSIS — J309 Allergic rhinitis, unspecified: Secondary | ICD-10-CM | POA: Diagnosis not present

## 2020-08-11 ENCOUNTER — Ambulatory Visit (INDEPENDENT_AMBULATORY_CARE_PROVIDER_SITE_OTHER): Payer: BC Managed Care – PPO

## 2020-08-11 DIAGNOSIS — J309 Allergic rhinitis, unspecified: Secondary | ICD-10-CM | POA: Diagnosis not present

## 2020-08-25 ENCOUNTER — Ambulatory Visit (INDEPENDENT_AMBULATORY_CARE_PROVIDER_SITE_OTHER): Payer: BC Managed Care – PPO

## 2020-08-25 DIAGNOSIS — J309 Allergic rhinitis, unspecified: Secondary | ICD-10-CM | POA: Diagnosis not present

## 2020-09-06 NOTE — Progress Notes (Signed)
VIALS EXP 09-07-21 

## 2020-09-08 DIAGNOSIS — J3089 Other allergic rhinitis: Secondary | ICD-10-CM | POA: Diagnosis not present

## 2020-09-09 ENCOUNTER — Ambulatory Visit (INDEPENDENT_AMBULATORY_CARE_PROVIDER_SITE_OTHER): Payer: BC Managed Care – PPO

## 2020-09-09 DIAGNOSIS — J309 Allergic rhinitis, unspecified: Secondary | ICD-10-CM

## 2020-09-23 ENCOUNTER — Ambulatory Visit (INDEPENDENT_AMBULATORY_CARE_PROVIDER_SITE_OTHER): Payer: BC Managed Care – PPO

## 2020-09-23 DIAGNOSIS — J309 Allergic rhinitis, unspecified: Secondary | ICD-10-CM

## 2020-09-30 ENCOUNTER — Other Ambulatory Visit: Payer: Self-pay | Admitting: Internal Medicine

## 2020-10-08 ENCOUNTER — Ambulatory Visit (INDEPENDENT_AMBULATORY_CARE_PROVIDER_SITE_OTHER): Payer: BC Managed Care – PPO

## 2020-10-08 DIAGNOSIS — J309 Allergic rhinitis, unspecified: Secondary | ICD-10-CM | POA: Diagnosis not present

## 2020-10-20 ENCOUNTER — Ambulatory Visit (INDEPENDENT_AMBULATORY_CARE_PROVIDER_SITE_OTHER): Payer: BC Managed Care – PPO

## 2020-10-20 DIAGNOSIS — J309 Allergic rhinitis, unspecified: Secondary | ICD-10-CM

## 2020-10-27 ENCOUNTER — Ambulatory Visit (INDEPENDENT_AMBULATORY_CARE_PROVIDER_SITE_OTHER): Payer: BC Managed Care – PPO

## 2020-10-27 DIAGNOSIS — J309 Allergic rhinitis, unspecified: Secondary | ICD-10-CM

## 2020-11-04 ENCOUNTER — Ambulatory Visit (INDEPENDENT_AMBULATORY_CARE_PROVIDER_SITE_OTHER): Payer: BC Managed Care – PPO

## 2020-11-04 DIAGNOSIS — J309 Allergic rhinitis, unspecified: Secondary | ICD-10-CM

## 2020-11-10 ENCOUNTER — Ambulatory Visit (INDEPENDENT_AMBULATORY_CARE_PROVIDER_SITE_OTHER): Payer: BC Managed Care – PPO | Admitting: *Deleted

## 2020-11-10 DIAGNOSIS — J309 Allergic rhinitis, unspecified: Secondary | ICD-10-CM | POA: Diagnosis not present

## 2020-11-18 ENCOUNTER — Ambulatory Visit (INDEPENDENT_AMBULATORY_CARE_PROVIDER_SITE_OTHER): Payer: BC Managed Care – PPO

## 2020-11-18 DIAGNOSIS — J309 Allergic rhinitis, unspecified: Secondary | ICD-10-CM

## 2020-11-29 ENCOUNTER — Encounter: Payer: Self-pay | Admitting: Allergy & Immunology

## 2020-11-29 ENCOUNTER — Ambulatory Visit: Payer: BC Managed Care – PPO | Admitting: Allergy & Immunology

## 2020-11-29 ENCOUNTER — Other Ambulatory Visit: Payer: Self-pay

## 2020-11-29 VITALS — BP 114/70 | HR 72 | Temp 98.3°F | Resp 20 | Ht 63.0 in | Wt 228.2 lb

## 2020-11-29 DIAGNOSIS — J309 Allergic rhinitis, unspecified: Secondary | ICD-10-CM

## 2020-11-29 DIAGNOSIS — J302 Other seasonal allergic rhinitis: Secondary | ICD-10-CM

## 2020-11-29 DIAGNOSIS — J3089 Other allergic rhinitis: Secondary | ICD-10-CM | POA: Diagnosis not present

## 2020-11-29 MED ORDER — MONTELUKAST SODIUM 10 MG PO TABS: ORAL_TABLET | ORAL | 2 refills | Status: DC

## 2020-11-29 NOTE — Patient Instructions (Addendum)
1. Seasonal and perennial allergic rhinitis - Continue with allergy shots at the same schedule. - We are not going to make any changes at all. - Continue with Flonase one spray per nostril daily. - Continue with Allegra one tablet daily. - Continue with Singulair (montelukast) 10mg  daily.  - We will plan to continue through May 2026 (five years total).  2. Return in about 1 year (around 11/29/2021).    Please inform us of any Emergency Department visits, hospitalizations, or changes in symptoms. Call us before going to the ED for breathing or allergy symptoms since we might be able to fit you in for a sick visit. Feel free to contact us anytime with any questions, problems, or concerns.  It was a pleasure to meet you today!  Websites that have reliable patient information: 1. American Academy of Asthma, Allergy, and Immunology: www.aaaai.org 2. Food Allergy Research and Education (FARE): foodallergy.org 3. Mothers of Asthmatics: http://www.asthmacommunitynetwork.org 4. American College of Allergy, Asthma, and Immunology: www.acaai.org   COVID-19 Vaccine Information can be found at: ShippingScam.co.uk For questions related to vaccine distribution or appointments, please email [email or web address] or call [phone].   We realize that you might be concerned about having an allergic reaction to the COVID19 vaccines. To help with that concern, WE ARE OFFERING THE COVID19 VACCINES IN OUR OFFICE! Ask the front desk for dates!     "Like" [place] on Facebook and Instagram for our latest updates!      A healthy democracy works best when [employer] participate! Make sure you are registered to vote! If you have moved or changed any of your contact information, you will need to get this updated before voting!  In some cases, you MAY be able to register to vote online: [email or web address]

## 2020-11-29 NOTE — Progress Notes (Signed)
FOLLOW UP  Date of Service/Encounter:  11/29/20   Assessment:   Seasonal and perennial allergic rhinitis (grasses, weeds, trees, mold, dog, cat, dust mite) - on allergen immunotherapy   Overall, Audrey Jenkins is doing quite well with her allergen immunotherapy.  She is very hesitant to change her medications at all since she is doing so well, and I completely understand that.  I did tell her maybe in the winter we can think about decreasing some of her medications.  She has tolerated her up doses without any adverse event.  We are to make any changes at this time.  Plan/Recommendations:   1. Seasonal and perennial allergic rhinitis - Continue with allergy shots at the same schedule. - We are not going to make any changes at all. - Continue with Flonase one spray per nostril daily. - Continue with Allegra one tablet daily. - Continue with Singulair (montelukast) 10mg  daily.  - We will plan to continue through May 2026 (five years total).  2. Return in about 1 year (around 11/29/2021).   Subjective:   Audrey Jenkins is a 65 y.o. female presenting today for follow up of allergic rhinitis.    Audrey Jenkins has a history of the following: Patient Active Problem List   Diagnosis Date Noted  . Seasonal and perennial allergic rhinoconjunctivitis 04/18/2019  . OSA on CPAP 01/17/2019  . 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 Migraine without aura and without status migrainosus, not intractable 02/23/2014  . Shoulder pain, left 02/23/2014  . Obesity (BMI 30-39.9) 02/23/2014  . Visit for preventive health examination 02/06/2013  . Epicondylitis, lateral (tennis elbow) 02/06/2013  . Medication management 02/06/2013  . Routine gynecological examination 01/31/2012  . Routine general medical examination at a health care facility 01/31/2012  . OBESITY 02/01/2010  . SNORING 02/01/2010  . LIVER FUNCTION TESTS, ABNORMAL 02/01/2010  . OTHER CHRONIC DISEASE OF TONSILS AND ADENOIDS  07/13/2009  . FOOT PAIN, RIGHT 07/13/2009  . ADJUSTMENT DISORDER WITH ANXIOUS MOOD 11/19/2007  . OSTEOARTHRITIS 11/19/2007  . Vitamin D deficiency 07/31/2007  . ARTHROPATHY NEC, MULTIPLE SITES 05/22/2007  . Hyperlipidemia 03/06/2007  . ALLERGIC RHINITIS 03/06/2007  . HEADACHE 03/06/2007    History obtained from: chart review and patient.  Audrey Jenkins is a 65 y.o. female presenting for a follow up visit. She has a history of perennial and seasonal allergic rhinitis.  She was last seen in September 2021.  At that time, she was on allergy shots.  Weaning of her medications as recommended.  She was continued on Flonase 1 spray per nostril twice daily as needed, Singulair 10 mg once daily, Pataday as needed, and over-the-counter antihistamine as needed, and nasal saline rinses.  Since the last visit, she has done well. The shots and the medications are working very well. She was coughing and unable to breathe prior to this. Now she is on the Flonase, Allegra, and montelukast every day. She did decrease her Flonase to one spray per nostril daily. She is very nervous about decreasing more of her medications. She does not want to do anything until the winter in any case and especially not in April.   Audrey Jenkins is on allergen immunotherapy. She receives two injections. Immunotherapy script #1 contains molds and dust mites. She currently receives 0.10mL of the RED vial (1/100). Immunotherapy script #2 contains trees, weeds, grasses, cat and dog. She currently receives 0.42mL of the RED vial (1/100). She started shots September of 2020 and reached maintenance in May of  2021.  Otherwise, there have been no changes to her past medical history, surgical history, family history, or social history.   Review of Systems  Constitutional: Negative.  Negative for chills, fever, malaise/fatigue and weight loss.  HENT: Negative for congestion, ear discharge, ear pain and sinus pain.   Eyes: Negative for pain, discharge and  redness.  Respiratory: Negative for cough, sputum production, shortness of breath and wheezing.   Cardiovascular: Negative.  Negative for chest pain and palpitations.  Gastrointestinal: Negative for abdominal pain, constipation, diarrhea, heartburn, nausea and vomiting.  Skin: Negative.  Negative for itching and rash.  Neurological: Negative for dizziness and headaches.  Endo/Heme/Allergies: Negative for environmental allergies.       Objective:   Today's Vitals   11/29/20 1501  BP: 114/70  Pulse: 72  Resp: 20  Temp: 98.3 F (36.8 C)  TempSrc: Temporal  SpO2: 97%  Weight: 228 lb 3.2 oz (103.5 kg)  Height: 5\' 3"  (1.6 m)   Body mass index is 40.42 kg/m.   General:  alert, active, in no acute distress Head:  normocephalic, no masses, lesions, tenderness or abnormalities Eyes:  conjunctiva clear without injection or discharge, EOMI, PERL Ears:  TM's pearly white bilaterally, external auditory canals are clear, external ears are normally set and rotated Nose:  External nose within normal limits, normal appearing turbinates, clear-colored discharge, septum midline Throat:  moist mucous membranes without erythema, exudates or petechiae, no thrush Neck:  Supple without thyromegaly or adenopathy appreciated Lymphatic: no adenopathy appreciated in the cervical, posterior auricular, axillary, epitrochlear, inguinal, or popliteal regions Lungs:  clear to auscultation, no wheezing, crackles or rhonchi, breathing unlabored, moving air well in all lung fields Heart:  regular rate and rhythm, normal S1/S2, no murmurs or gallops, normal peripheral perfusion Musculoskeletal:  no cyanosis, clubbing or edema Skin:  skin color, texture and turgor are normal; no bruising, rashes or lesions noted.   Diagnostic studies: none      Salvatore Marvel, MD  [facility]

## 2020-12-06 ENCOUNTER — Ambulatory Visit: Payer: BC Managed Care – PPO | Admitting: Allergy and Immunology

## 2020-12-13 NOTE — Progress Notes (Signed)
VIALS EXP 12-13-21

## 2020-12-14 DIAGNOSIS — J3089 Other allergic rhinitis: Secondary | ICD-10-CM | POA: Diagnosis not present

## 2020-12-23 ENCOUNTER — Ambulatory Visit (INDEPENDENT_AMBULATORY_CARE_PROVIDER_SITE_OTHER): Payer: BC Managed Care – PPO

## 2020-12-23 DIAGNOSIS — J309 Allergic rhinitis, unspecified: Secondary | ICD-10-CM

## 2021-01-01 ENCOUNTER — Other Ambulatory Visit: Payer: Self-pay | Admitting: Internal Medicine

## 2021-01-05 ENCOUNTER — Ambulatory Visit (INDEPENDENT_AMBULATORY_CARE_PROVIDER_SITE_OTHER): Payer: BC Managed Care – PPO

## 2021-01-05 DIAGNOSIS — J309 Allergic rhinitis, unspecified: Secondary | ICD-10-CM

## 2021-01-20 ENCOUNTER — Ambulatory Visit (INDEPENDENT_AMBULATORY_CARE_PROVIDER_SITE_OTHER): Payer: BC Managed Care – PPO

## 2021-01-20 DIAGNOSIS — J309 Allergic rhinitis, unspecified: Secondary | ICD-10-CM

## 2021-02-04 ENCOUNTER — Ambulatory Visit (INDEPENDENT_AMBULATORY_CARE_PROVIDER_SITE_OTHER): Payer: BC Managed Care – PPO

## 2021-02-04 DIAGNOSIS — J309 Allergic rhinitis, unspecified: Secondary | ICD-10-CM | POA: Diagnosis not present

## 2021-02-18 ENCOUNTER — Ambulatory Visit (INDEPENDENT_AMBULATORY_CARE_PROVIDER_SITE_OTHER): Payer: BC Managed Care – PPO

## 2021-02-18 DIAGNOSIS — J309 Allergic rhinitis, unspecified: Secondary | ICD-10-CM | POA: Diagnosis not present

## 2021-02-21 ENCOUNTER — Other Ambulatory Visit: Payer: Self-pay | Admitting: Internal Medicine

## 2021-02-23 ENCOUNTER — Other Ambulatory Visit: Payer: BC Managed Care – PPO

## 2021-02-23 ENCOUNTER — Ambulatory Visit (INDEPENDENT_AMBULATORY_CARE_PROVIDER_SITE_OTHER): Payer: BC Managed Care – PPO

## 2021-02-23 DIAGNOSIS — J309 Allergic rhinitis, unspecified: Secondary | ICD-10-CM | POA: Diagnosis not present

## 2021-03-01 NOTE — Progress Notes (Signed)
Virtual Visit via Video Note  I connected withNAME@ on 03/02/21 at 12:30 PM EDT by a video enabled telemedicine application and verified that I am speaking with the correct person using two identifiers. Location patient: home Location provider:work ooffice Persons participating in the virtual visit: patient, provider  WIth national recommendations  regarding COVID 19 pandemic   video visit is advised over in office visit for this patient.  Patient aware  of the limitations of evaluation and management by telemedicine and  availability of in person appointments. and agreed to proceed.   HPI: Audrey Jenkins presents for video visit for medicine check Due for lab monitoring will come in tomorrow morning. HLD has been on atorvastatin 80 mg ever since she was 33 denies any side effects needs refill Mood medication on Lexapro 10 mg "think she likes it a lot" is also help with the hot flashes and good for her anxiety wants to remain on this. Feels pretty good except she is tired a lot not very active not like when she was sick seriously anemic Treatment for CPAP for her OSA but still feels tired and "sleeps too much". 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 Allergy shots have been helpful.  Last weight she is aware of was 230 in April. Blood pressure appears to be fine Some joint symptoms AM stiffness that she thinks is older age and not related to other.    ROS: See pertinent positives and negatives per HPI.  Past Medical History:  Diagnosis Date   Abnormal LFTs    Allergy    Anemia    Anxiety    Depression    Fatty liver    on [place] Jan 2011   Headache(784.0)    Hyperlipidemia    Inital 400's   Mood disorder ([place])    Osteoarthritis    Primiparous    Sleep apnea    uses cpap   Snoring    ENT [facility] Jan 2011 nasal obstruction and cryptic tonsils    Past Surgical History:  Procedure Laterality Date   BREAST SURGERY  1995   bx    COLONOSCOPY W/ BIOPSIES  04/26/2020   with removal of 8 polyps(polypectomy   dental implant Left 11/2016   TIBIA FRACTURE SURGERY     september 2020, no surgery    Family History  Problem Relation Age of Onset   Arthritis Mother    COPD Mother    Dementia Mother    Endometrial cancer Mother    Skin cancer Mother    Osteoporosis Mother    Other Mother        dementia   Heart attack Father    Hypertension Father        cabd Mi 34 died CHF   Heart failure Father    Heart disease Father    Sleep apnea Sister        improved with weight loss   Arthritis Sister    Allergic rhinitis Sister    Coronary artery disease Sister    Vasculitis Sister    Allergic rhinitis Sister    Stomach cancer Maternal Grandfather    Angioedema Neg Hx    Asthma Neg Hx    Eczema Neg Hx    Immunodeficiency Neg Hx    Urticaria Neg Hx    Colon cancer Neg Hx    Colon polyps Neg Hx    Esophageal cancer Neg Hx     Social History  Tobacco Use   Smoking status: Never   Smokeless tobacco: Never  Vaping Use   Vaping Use: Never used  Substance Use Topics   Alcohol use: Not Currently    Alcohol/week: 0.0 standard drinks    Comment: socially   Drug use: No      Current Outpatient Medications:    acetaminophen (TYLENOL) 500 MG tablet, Take 500-1,000 mg by mouth every 6 (six) hours as needed., Disp: , Rfl:    eletriptan (RELPAX) 40 MG tablet, TAKE 1 TABLET BY MOUTH AS NEEDED FOR MIGRAINE OR HEADACHE. MAY REPEAT IN 2 HOURS IF NEEDED, Disp: 9 tablet, Rfl: 1   EPINEPHrine (AUVI-Q) 0.3 mg/0.3 mL IJ SOAJ injection, Inject 0.3 mg into the muscle as needed for anaphylaxis., Disp: 1 each, Rfl: 1   estradiol (ESTRACE) 0.1 MG/GM vaginal cream, Place 1 Applicatorful vaginally 3 (three) times a week. PRN. Schedule follow up for further refills. [phone], Disp: 42.5 g, Rfl: 0   Ferrous Sulfate 142 (45 Fe) MG TBCR, Take 1 tablet by mouth daily., Disp: , Rfl:    fexofenadine (ALLEGRA) 180 MG tablet, Take 180  mg by mouth daily., Disp: , Rfl:    fluticasone (FLONASE) 50 MCG/ACT nasal spray, USE 1-2 SPRAYS PER NOSTRIL FOR SINUS ISSUES., Disp: 48 mL, Rfl: 2   montelukast (SINGULAIR) 10 MG tablet, Take 1 tablet once at night for coughing or wheezing, Disp: 90 tablet, Rfl: 2   NON FORMULARY, 2 injections every week then at 0.50 cc every 2 weeks, Disp: , Rfl:    Olopatadine HCl 0.2 % SOLN, Apply 2 drops to eye daily as needed., Disp: 2.5 mL, Rfl: 5   VITAMIN D PO, Take by mouth., Disp: , Rfl:    atorvastatin (LIPITOR) 80 MG tablet, Take 1 tablet (80 mg total) by mouth daily., Disp: 90 tablet, Rfl: 1   escitalopram (LEXAPRO) 10 MG tablet, Take 1 tablet (10 mg total) by mouth daily., Disp: 90 tablet, Rfl: 1  EXAM: BP Readings from Last 3 Encounters:  11/29/20 114/70  05/07/20 122/84  04/26/20 (!) 128/56    VITALS per patient if applicable: ? 517# in Ap[ril   GENERAL: alert, oriented, appears well and in no acute distress  HEENT: atraumatic, conjunttiva clear, no obvious abnormalities on inspection of external nose and ears  NECK: normal movements of the head and neck  LUNGS: on inspection no signs of respiratory distress, breathing rate appears normal, no obvious gross SOB, gasping or wheezing  CV: no obvious cyanosis   PSYCH/NEURO: pleasant and cooperative, no obvious depression or anxiety, speech and thought processing grossly intact Lab Results  Component Value Date   WBC 9.4 04/26/2020   HGB 13.2 04/26/2020   HCT 41.1 04/26/2020   PLT 248.0 04/26/2020   GLUCOSE 94 02/10/2020   CHOL 203 (H) 02/10/2020   TRIG 127.0 02/10/2020   HDL 55.50 02/10/2020   LDLDIRECT 157.4 02/19/2014   LDLCALC 122 (H) 02/10/2020   ALT 17 02/10/2020   AST 18 02/10/2020   NA 140 02/10/2020   K 4.6 02/10/2020   CL 105 02/10/2020   CREATININE 0.78 02/10/2020   BUN 7 02/10/2020   CO2 27 02/10/2020   TSH 1.85 02/10/2020   HGBA1C 6.1 02/10/2020   MICROALBUR <0.7 02/10/2020    ASSESSMENT AND  PLAN:  Discussed the following assessment and plan:    ICD-10-CM   1. Hyperlipidemia, unspecified hyperlipidemia type  O16.0 Basic metabolic panel    CBC with Differential/Platelet    Hemoglobin A1c  Hepatic function panel    Lipid panel    TSH    T4, free    C-reactive protein    Iron, TIBC and Ferritin Panel    VITAMIN D 25 Hydroxy (Vit-D Deficiency, Fractures)    2. Medication management  A21.308 Basic metabolic panel    CBC with Differential/Platelet    Hemoglobin A1c    Hepatic function panel    Lipid panel    TSH    T4, free    C-reactive protein    Iron, TIBC and Ferritin Panel    VITAMIN D 25 Hydroxy (Vit-D Deficiency, Fractures)    3. OSA on CPAP  M57.84 Basic metabolic panel   O96.29 CBC with Differential/Platelet    Hemoglobin A1c    Hepatic function panel    Lipid panel    TSH    T4, free    C-reactive protein    Iron, TIBC and Ferritin Panel    VITAMIN D 25 Hydroxy (Vit-D Deficiency, Fractures)    4. Fatty liver  B28.4 Basic metabolic panel    CBC with Differential/Platelet    Hemoglobin A1c    Hepatic function panel    Lipid panel    TSH    T4, free    C-reactive protein    Iron, TIBC and Ferritin Panel    VITAMIN D 25 Hydroxy (Vit-D Deficiency, Fractures)    5. Alkaline phosphatase elevation  X32.4 Basic metabolic panel    CBC with Differential/Platelet    Hemoglobin A1c    Hepatic function panel    Lipid panel    TSH    T4, free    C-reactive protein    Iron, TIBC and Ferritin Panel    VITAMIN D 25 Hydroxy (Vit-D Deficiency, Fractures)    6. Anemia, unspecified type  M01.0 Basic metabolic panel    CBC with Differential/Platelet    Hemoglobin A1c    Hepatic function panel    Lipid panel    TSH    T4, free    C-reactive protein    Iron, TIBC and Ferritin Panel    VITAMIN D 25 Hydroxy (Vit-D Deficiency, Fractures)    7. Vitamin D deficiency  U72.5 Basic metabolic panel    CBC with Differential/Platelet    Hemoglobin A1c     Hepatic function panel    Lipid panel    TSH    T4, free    C-reactive protein    Iron, TIBC and Ferritin Panel    VITAMIN D 25 Hydroxy (Vit-D Deficiency, Fractures)    8. Joint stiffness  D66.44 Basic metabolic panel    CBC with Differential/Platelet    Hemoglobin A1c    Hepatic function panel    Lipid panel    TSH    T4, free    C-reactive protein    Iron, TIBC and Ferritin Panel    VITAMIN D 25 Hydroxy (Vit-D Deficiency, Fractures)     Continue medication benefit more than rest fasting labs tomorrow Plan in person physical visit before the end of the year. Will refill medications Lexapro and atorvastatin at this time Uncertain if atorvastatin is giving any side effects but she thinks it is "old age". Counseled.   Expectant management and discussion of plan and treatment with opportunity to ask questions and all were answered. The patient agreed with the plan and demonstrated an understanding of the instructions. Record review counseling connection time follow-up 34 minutes Advised to call back or seek an in-person evaluation if worsening  or having  further concerns . Return for fasting lab tomorrow cpx in person visit before end of year. Shanon Ace, MD

## 2021-03-02 ENCOUNTER — Encounter: Payer: Self-pay | Admitting: Internal Medicine

## 2021-03-02 ENCOUNTER — Telehealth: Payer: BC Managed Care – PPO | Admitting: Internal Medicine

## 2021-03-02 ENCOUNTER — Ambulatory Visit (INDEPENDENT_AMBULATORY_CARE_PROVIDER_SITE_OTHER): Payer: BC Managed Care – PPO

## 2021-03-02 VITALS — Ht 63.0 in | Wt 228.2 lb

## 2021-03-02 DIAGNOSIS — Z79899 Other long term (current) drug therapy: Secondary | ICD-10-CM | POA: Diagnosis not present

## 2021-03-02 DIAGNOSIS — K760 Fatty (change of) liver, not elsewhere classified: Secondary | ICD-10-CM | POA: Diagnosis not present

## 2021-03-02 DIAGNOSIS — G4733 Obstructive sleep apnea (adult) (pediatric): Secondary | ICD-10-CM | POA: Diagnosis not present

## 2021-03-02 DIAGNOSIS — E559 Vitamin D deficiency, unspecified: Secondary | ICD-10-CM

## 2021-03-02 DIAGNOSIS — D649 Anemia, unspecified: Secondary | ICD-10-CM

## 2021-03-02 DIAGNOSIS — E785 Hyperlipidemia, unspecified: Secondary | ICD-10-CM

## 2021-03-02 DIAGNOSIS — M2560 Stiffness of unspecified joint, not elsewhere classified: Secondary | ICD-10-CM

## 2021-03-02 DIAGNOSIS — J309 Allergic rhinitis, unspecified: Secondary | ICD-10-CM | POA: Diagnosis not present

## 2021-03-02 DIAGNOSIS — Z9989 Dependence on other enabling machines and devices: Secondary | ICD-10-CM

## 2021-03-02 DIAGNOSIS — R748 Abnormal levels of other serum enzymes: Secondary | ICD-10-CM

## 2021-03-02 MED ORDER — ESCITALOPRAM OXALATE 10 MG PO TABS: 10.0000 mg | ORAL_TABLET | Freq: Every day | ORAL | 1 refills | Status: DC

## 2021-03-02 MED ORDER — ATORVASTATIN CALCIUM 80 MG PO TABS: 80.0000 mg | ORAL_TABLET | Freq: Every day | ORAL | 1 refills | Status: DC

## 2021-03-03 ENCOUNTER — Other Ambulatory Visit (INDEPENDENT_AMBULATORY_CARE_PROVIDER_SITE_OTHER): Payer: BC Managed Care – PPO

## 2021-03-03 ENCOUNTER — Other Ambulatory Visit: Payer: Self-pay

## 2021-03-03 DIAGNOSIS — M2560 Stiffness of unspecified joint, not elsewhere classified: Secondary | ICD-10-CM

## 2021-03-03 DIAGNOSIS — D649 Anemia, unspecified: Secondary | ICD-10-CM

## 2021-03-03 DIAGNOSIS — E559 Vitamin D deficiency, unspecified: Secondary | ICD-10-CM

## 2021-03-03 DIAGNOSIS — Z79899 Other long term (current) drug therapy: Secondary | ICD-10-CM

## 2021-03-03 DIAGNOSIS — Z9989 Dependence on other enabling machines and devices: Secondary | ICD-10-CM

## 2021-03-03 DIAGNOSIS — E785 Hyperlipidemia, unspecified: Secondary | ICD-10-CM

## 2021-03-03 DIAGNOSIS — K760 Fatty (change of) liver, not elsewhere classified: Secondary | ICD-10-CM

## 2021-03-03 DIAGNOSIS — R748 Abnormal levels of other serum enzymes: Secondary | ICD-10-CM

## 2021-03-03 DIAGNOSIS — G4733 Obstructive sleep apnea (adult) (pediatric): Secondary | ICD-10-CM | POA: Diagnosis not present

## 2021-03-03 LAB — CBC WITH DIFFERENTIAL/PLATELET
Basophils Absolute: 0.1 10*3/uL (ref 0.0–0.1)
Basophils Relative: 0.7 % (ref 0.0–3.0)
Eosinophils Absolute: 0.2 10*3/uL (ref 0.0–0.7)
Eosinophils Relative: 3.4 % (ref 0.0–5.0)
HCT: 35.1 % — ABNORMAL LOW (ref 36.0–46.0)
Hemoglobin: 11.8 g/dL — ABNORMAL LOW (ref 12.0–15.0)
Lymphocytes Relative: 28.6 % (ref 12.0–46.0)
Lymphs Abs: 2 10*3/uL (ref 0.7–4.0)
MCHC: 33.8 g/dL (ref 30.0–36.0)
MCV: 84.5 fl (ref 78.0–100.0)
Monocytes Absolute: 0.6 10*3/uL (ref 0.1–1.0)
Monocytes Relative: 8.1 % (ref 3.0–12.0)
Neutro Abs: 4.2 10*3/uL (ref 1.4–7.7)
Neutrophils Relative %: 59.2 % (ref 43.0–77.0)
Platelets: 271 10*3/uL (ref 150.0–400.0)
RBC: 4.15 Mil/uL (ref 3.87–5.11)
RDW: 14.5 % (ref 11.5–15.5)
WBC: 7 10*3/uL (ref 4.0–10.5)

## 2021-03-03 LAB — IBC + FERRITIN
Ferritin: 28.3 ng/mL (ref 10.0–291.0)
Iron: 49 ug/dL (ref 42–145)
Saturation Ratios: 13.2 % — ABNORMAL LOW (ref 20.0–50.0)
Transferrin: 265 mg/dL (ref 212.0–360.0)

## 2021-03-03 LAB — HEPATIC FUNCTION PANEL
ALT: 27 U/L (ref 0–35)
AST: 24 U/L (ref 0–37)
Albumin: 4 g/dL (ref 3.5–5.2)
Alkaline Phosphatase: 114 U/L (ref 39–117)
Bilirubin, Direct: 0.1 mg/dL (ref 0.0–0.3)
Total Bilirubin: 0.5 mg/dL (ref 0.2–1.2)
Total Protein: 6.7 g/dL (ref 6.0–8.3)

## 2021-03-03 LAB — LIPID PANEL
Cholesterol: 245 mg/dL — ABNORMAL HIGH (ref 0–200)
HDL: 52.3 mg/dL (ref >39.00)
LDL Cholesterol: 156 mg/dL — ABNORMAL HIGH (ref 0–99)
NonHDL: 192.2
Total CHOL/HDL Ratio: 5
Triglycerides: 179 mg/dL — ABNORMAL HIGH (ref 0.0–149.0)
VLDL: 35.8 mg/dL (ref 0.0–40.0)

## 2021-03-03 LAB — BASIC METABOLIC PANEL
BUN: 15 mg/dL (ref 6–23)
CO2: 28 mEq/L (ref 19–32)
Calcium: 9.2 mg/dL (ref 8.4–10.5)
Chloride: 105 mEq/L (ref 96–112)
Creatinine, Ser: 0.83 mg/dL (ref 0.40–1.20)
GFR: 75 mL/min (ref >60.00)
Glucose, Bld: 92 mg/dL (ref 70–99)
Potassium: 4.5 mEq/L (ref 3.5–5.1)
Sodium: 142 mEq/L (ref 135–145)

## 2021-03-03 LAB — TSH: TSH: 2.1 u[IU]/mL (ref 0.35–5.50)

## 2021-03-03 LAB — HEMOGLOBIN A1C: Hgb A1c MFr Bld: 5.6 % (ref 4.6–6.5)

## 2021-03-03 LAB — C-REACTIVE PROTEIN: CRP: <1 mg/dL (ref 0.5–20.0)

## 2021-03-03 LAB — VITAMIN D 25 HYDROXY (VIT D DEFICIENCY, FRACTURES): VITD: 25.15 ng/mL — ABNORMAL LOW (ref 30.00–100.00)

## 2021-03-03 LAB — T4, FREE: Free T4: 0.65 ng/dL (ref 0.60–1.60)

## 2021-03-06 NOTE — Progress Notes (Signed)
Mild anemia   lower iron . Vit d slightly low   cholesterol not as good as in past .  Liver  fine and no diabetes . Advise taking iron supplement as well as  vit d  . 2000- 4000 iu per day  Plan to recheck these findings at   your cpx OV in person in the next 3-6 months

## 2021-03-10 ENCOUNTER — Ambulatory Visit (INDEPENDENT_AMBULATORY_CARE_PROVIDER_SITE_OTHER): Payer: BC Managed Care – PPO

## 2021-03-10 DIAGNOSIS — J309 Allergic rhinitis, unspecified: Secondary | ICD-10-CM | POA: Diagnosis not present

## 2021-03-17 ENCOUNTER — Ambulatory Visit (INDEPENDENT_AMBULATORY_CARE_PROVIDER_SITE_OTHER): Payer: BC Managed Care – PPO

## 2021-03-17 DIAGNOSIS — J309 Allergic rhinitis, unspecified: Secondary | ICD-10-CM | POA: Diagnosis not present

## 2021-04-08 ENCOUNTER — Ambulatory Visit (INDEPENDENT_AMBULATORY_CARE_PROVIDER_SITE_OTHER): Payer: BC Managed Care – PPO

## 2021-04-08 DIAGNOSIS — J309 Allergic rhinitis, unspecified: Secondary | ICD-10-CM | POA: Diagnosis not present

## 2021-04-18 NOTE — Progress Notes (Signed)
VIALS MADE. EXP 04-18-22

## 2021-04-21 ENCOUNTER — Ambulatory Visit (INDEPENDENT_AMBULATORY_CARE_PROVIDER_SITE_OTHER): Payer: BC Managed Care – PPO

## 2021-04-21 DIAGNOSIS — J309 Allergic rhinitis, unspecified: Secondary | ICD-10-CM

## 2021-04-22 DIAGNOSIS — J3081 Allergic rhinitis due to animal (cat) (dog) hair and dander: Secondary | ICD-10-CM | POA: Diagnosis not present

## 2021-05-05 ENCOUNTER — Ambulatory Visit (INDEPENDENT_AMBULATORY_CARE_PROVIDER_SITE_OTHER): Payer: BC Managed Care – PPO

## 2021-05-05 DIAGNOSIS — J309 Allergic rhinitis, unspecified: Secondary | ICD-10-CM

## 2021-05-20 ENCOUNTER — Ambulatory Visit (INDEPENDENT_AMBULATORY_CARE_PROVIDER_SITE_OTHER): Payer: BC Managed Care – PPO

## 2021-05-20 DIAGNOSIS — J309 Allergic rhinitis, unspecified: Secondary | ICD-10-CM

## 2021-06-03 ENCOUNTER — Ambulatory Visit (INDEPENDENT_AMBULATORY_CARE_PROVIDER_SITE_OTHER): Payer: BC Managed Care – PPO

## 2021-06-03 DIAGNOSIS — J309 Allergic rhinitis, unspecified: Secondary | ICD-10-CM | POA: Diagnosis not present

## 2021-06-15 ENCOUNTER — Ambulatory Visit (INDEPENDENT_AMBULATORY_CARE_PROVIDER_SITE_OTHER): Payer: BC Managed Care – PPO

## 2021-06-15 ENCOUNTER — Other Ambulatory Visit: Payer: Self-pay | Admitting: Internal Medicine

## 2021-06-15 DIAGNOSIS — J309 Allergic rhinitis, unspecified: Secondary | ICD-10-CM

## 2021-06-22 ENCOUNTER — Ambulatory Visit (INDEPENDENT_AMBULATORY_CARE_PROVIDER_SITE_OTHER): Payer: BC Managed Care – PPO

## 2021-06-22 DIAGNOSIS — J309 Allergic rhinitis, unspecified: Secondary | ICD-10-CM | POA: Diagnosis not present

## 2021-06-28 ENCOUNTER — Other Ambulatory Visit: Payer: Self-pay | Admitting: Internal Medicine

## 2021-07-01 ENCOUNTER — Ambulatory Visit (INDEPENDENT_AMBULATORY_CARE_PROVIDER_SITE_OTHER): Payer: BC Managed Care – PPO

## 2021-07-01 DIAGNOSIS — J309 Allergic rhinitis, unspecified: Secondary | ICD-10-CM | POA: Diagnosis not present

## 2021-07-05 ENCOUNTER — Other Ambulatory Visit: Payer: Self-pay

## 2021-07-05 ENCOUNTER — Telehealth: Payer: Self-pay | Admitting: Internal Medicine

## 2021-07-05 ENCOUNTER — Encounter: Payer: Self-pay | Admitting: Internal Medicine

## 2021-07-05 ENCOUNTER — Ambulatory Visit: Payer: BC Managed Care – PPO | Admitting: Internal Medicine

## 2021-07-05 VITALS — BP 112/72 | HR 74 | Temp 97.9°F | Ht 63.0 in | Wt 238.8 lb

## 2021-07-05 DIAGNOSIS — D649 Anemia, unspecified: Secondary | ICD-10-CM

## 2021-07-05 DIAGNOSIS — K760 Fatty (change of) liver, not elsewhere classified: Secondary | ICD-10-CM | POA: Diagnosis not present

## 2021-07-05 DIAGNOSIS — Z79899 Other long term (current) drug therapy: Secondary | ICD-10-CM

## 2021-07-05 DIAGNOSIS — G43009 Migraine without aura, not intractable, without status migrainosus: Secondary | ICD-10-CM

## 2021-07-05 DIAGNOSIS — R519 Headache, unspecified: Secondary | ICD-10-CM

## 2021-07-05 DIAGNOSIS — G4733 Obstructive sleep apnea (adult) (pediatric): Secondary | ICD-10-CM

## 2021-07-05 DIAGNOSIS — E785 Hyperlipidemia, unspecified: Secondary | ICD-10-CM

## 2021-07-05 DIAGNOSIS — Z9989 Dependence on other enabling machines and devices: Secondary | ICD-10-CM

## 2021-07-05 DIAGNOSIS — R293 Abnormal posture: Secondary | ICD-10-CM

## 2021-07-05 NOTE — Progress Notes (Signed)
Chief Complaint  Patient presents with   Medication Refill     HPI: Audrey Jenkins 66 y.o. come in for Chronic disease management  New problem recently her hands in her mouth expression do not do what her brain says and she does not realize she is in that position other people of noticed it and it makes her feel uncomfortable.  States that it is not really of movement but a position.  Her hands are both  fisted with thumbs between the second and third and mouth is lip out in a frown like position. No tremor or rigidity following dysfunction.  Headaches are stable at about 2/month no recent falling although past history of such Worries about dementia negative family history think she is okay does get stressed but doing well at her current jobs.  Feels that the Lexapro is helpful.  Has gained some weight because of mood availability at her workplace.   On going  not sure why and others noted.  HLD atorva  Mood lexapro HAs  :  once in a while    broken sleep is trigger .   2 per month  Hx anemia iron deficiency Allergy  under rx  shots .  On iron per day .     Gi polyps   taken out.  Had no other cause of anemia. Work from Field seismologist.   Hx fall in past Sleep:   ok  ?    ROS: See pertinent positives and negatives per HPI.  Past Medical History:  Diagnosis Date   Abnormal LFTs    Allergy    Anemia    Anxiety    Depression    Fatty liver    on [place] Jan 2011   Headache(784.0)    Hyperlipidemia    Inital 400's   Mood disorder ([place])    Osteoarthritis    Primiparous    Sleep apnea    uses cpap   Snoring    [facility] Jan 2011 nasal obstruction and cryptic tonsils    Family History  Problem Relation Age of Onset   Arthritis Mother    COPD Mother    Dementia Mother    Endometrial cancer Mother    Skin cancer Mother    Osteoporosis Mother    Other Mother        dementia   Heart attack Father    Hypertension Father        cabd Mi 52 died CHF   Heart failure  Father    Heart disease Father    Sleep apnea Sister        improved with weight loss   Arthritis Sister    Allergic rhinitis Sister    Coronary artery disease Sister    Vasculitis Sister    Allergic rhinitis Sister    Stomach cancer Maternal Grandfather    Angioedema Neg Hx    Asthma Neg Hx    Eczema Neg Hx    Immunodeficiency Neg Hx    Urticaria Neg Hx    Colon cancer Neg Hx    Colon polyps Neg Hx    Esophageal cancer Neg Hx     Social History   Socioeconomic History   Marital status: Married    Spouse name: Not on file   Number of children: Not on file   Years of education: Not on file   Highest education level: Not on file  Occupational History   Not  on file  Tobacco Use   Smoking status: Never   Smokeless tobacco: Never  Vaping Use   Vaping Use: Never used  Substance and Sexual Activity   Alcohol use: Not Currently    Alcohol/week: 0.0 standard drinks    Comment: socially   Drug use: No   Sexual activity: Not on file  Other Topics Concern   Not on file  Social History Narrative   Married   HH of 2   Pet Kittens   Works with Geophysicist/field seismologist    employed [employer]  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     atorvastatin (LIPITOR) 80 MG tablet TAKE 1 TABLET BY MOUTH EVERY DAY 90 tablet 0   eletriptan (RELPAX) 40 MG tablet TAKE 1 TABLET BY MOUTH AS NEEDED FOR MIGRAINE OR HEADACHE. MAY REPEAT IN 2 HOURS IF NEEDED 9 tablet 1   EPINEPHrine (AUVI-Q) 0.3 mg/0.3 mL IJ SOAJ injection Inject 0.3 mg into  the muscle as needed for anaphylaxis. 1 each 1   escitalopram (LEXAPRO) 10 MG tablet Take 1 tablet (10 mg total) by mouth daily. 90 tablet 1   estradiol (ESTRACE) 0.1 MG/GM vaginal cream Place 1 Applicatorful vaginally 3 (three) times a week. PRN. Schedule follow up for further refills. [phone] 42.5 g 0   Ferrous Sulfate 142 (45 Fe) MG TBCR Take 1 tablet by mouth daily.     fexofenadine (ALLEGRA) 180 MG tablet Take 180 mg by mouth daily.     fluticasone (FLONASE) 50 MCG/ACT nasal spray USE 1-2 SPRAYS PER NOSTRIL FOR SINUS ISSUES. 48 mL 2   montelukast (SINGULAIR) 10 MG tablet Take 1 tablet once at night for coughing or wheezing 90 tablet 2   NON FORMULARY 2 injections every week then at 0.50 cc every 2 weeks     Olopatadine HCl 0.2 % SOLN Apply 2 drops to eye daily as needed. 2.5 mL 5   VITAMIN D PO Take by mouth.     No facility-administered medications prior to visit.     EXAM:  BP 112/72 (BP Location: Left Arm, Patient Position: Sitting, Cuff Size: Normal)   Pulse 74   Temp 97.9 F (36.6 C) (Oral)   Ht 5\' 3"  (1.6 m)   Wt 238 lb 12.8 oz (108.3 kg)   SpO2 95%   BMI 42.30 kg/m   Body mass index is 42.3 kg/m. Wt Readings from Last 3 Encounters:  07/05/21 238 lb 12.8 oz (108.3 kg)  03/02/21 228 lb 3.2 oz (103.5 kg)  11/29/20 228 lb 3.2 oz (103.5 kg)    GENERAL: vitals reviewed and listed above, alert, oriented, appears well hydrated and in no acute distress HEENT: atraumatic, conjunctiva  clear, no obvious abnormalities on inspection of external nose and ears OP : Masked NECK: no obvious masses on inspection palpation  LUNGS: clear to auscultation bilaterally, no wheezes, rales or rhonchi, good air movement CV: HRRR, no clubbing cyanosis or  peripheral edema nl cap refill  MS: moves all extremities without noticeable focal  abnormality PSYCH: pleasant and cooperative, no obvious depression or anxiety Neuro no rigidity no obvious tonic reaction but sitting held her fist  with her thumbs between the second and third bilaterally.  No cogwheeling gait appears to be adequate normal Lab Results  Component Value Date   WBC 7.0 03/03/2021   HGB 11.8 (L) 03/03/2021   HCT 35.1 (L) 03/03/2021   PLT 271.0 03/03/2021   GLUCOSE 92 03/03/2021   CHOL 245 (H) 03/03/2021   TRIG 179.0 (H) 03/03/2021   HDL 52.30 03/03/2021   LDLDIRECT 157.4 02/19/2014   LDLCALC 156 (H) 03/03/2021   ALT 27 03/03/2021   AST 24 03/03/2021   NA 142 03/03/2021   K 4.5 03/03/2021   CL 105 03/03/2021   CREATININE 0.83 03/03/2021   BUN 15 03/03/2021   CO2 28 03/03/2021   TSH 2.10 03/03/2021   HGBA1C 5.6 03/03/2021   MICROALBUR <0.7 02/10/2020   BP Readings from Last 3 Encounters:  07/05/21 112/72  11/29/20 114/70  05/07/20 122/84   Vit d 25  ASSESSMENT AND PLAN:  Discussed the following assessment and plan:  Medication management - Plan: CBC with Differential/Platelet, Basic metabolic panel, TSH, T4, free, Hepatic function panel, IBC Panel([facility]), Vitamin B12, Celiac Disease Comprehensive Panel with Reflexes, Celiac Disease Comprehensive Panel with Reflexes, Vitamin B12, IBC Panel([facility]), Hepatic function panel, T4, free, TSH, Basic metabolic panel, CBC with Differential/Platelet  Hyperlipidemia, unspecified hyperlipidemia type - Plan: CBC with Differential/Platelet, Basic metabolic panel, TSH, T4, free, Hepatic function panel, IBC Panel([facility]), Vitamin B12, Celiac Disease Comprehensive Panel with Reflexes, Celiac Disease Comprehensive Panel with Reflexes, Vitamin B12, IBC Panel([facility]), Hepatic function panel, T4, free, TSH, Basic metabolic panel, CBC with Differential/Platelet  Anemia, unspecified type - low iron saturation - Plan: CBC with Differential/Platelet, Basic metabolic panel, TSH, T4, free, Hepatic function panel, IBC Panel([facility]), Vitamin B12, Celiac Disease Comprehensive Panel with Reflexes, Celiac Disease Comprehensive Panel with Reflexes, Vitamin B12, IBC  Panel([facility]), Hepatic function panel, T4, free, TSH, Basic metabolic panel, CBC with Differential/Platelet  Fatty liver - Plan: CBC with Differential/Platelet, Basic metabolic panel, TSH, T4, free, Hepatic function panel, IBC Panel([facility]), Vitamin B12, Celiac Disease Comprehensive Panel with Reflexes, Celiac Disease Comprehensive Panel with Reflexes, Vitamin B12, IBC Panel([facility]), Hepatic function panel, T4, free, TSH, Basic metabolic panel, CBC with Differential/Platelet  OSA on CPAP - Plan: CBC with Differential/Platelet, Basic metabolic panel, TSH, T4, free, Hepatic function panel, IBC Panel([facility]), Vitamin B12, Celiac Disease Comprehensive Panel with Reflexes, Celiac Disease Comprehensive Panel with Reflexes, Vitamin B12, IBC Panel([facility]), Hepatic function panel, T4, free, TSH, Basic metabolic panel, CBC with Differential/Platelet  Headache, unspecified headache type - Plan: CBC with Differential/Platelet, Basic metabolic panel, TSH, T4, free, Hepatic function panel, IBC Panel([facility]), Vitamin B12, Celiac Disease Comprehensive Panel with Reflexes, Celiac Disease Comprehensive Panel with Reflexes, Vitamin B12, IBC Panel([facility]), Hepatic function panel, T4, free, TSH, Basic metabolic panel, CBC with Differential/Platelet  Abnormal posturing face and hands  - normal exam - Plan: CBC with Differential/Platelet, Basic metabolic panel, TSH, T4, free, Hepatic function panel, IBC Panel([facility]), Vitamin B12, Celiac Disease Comprehensive Panel with Reflexes, Celiac Disease Comprehensive Panel with Reflexes, Vitamin B12, IBC Panel([facility]), Hepatic function panel, T4, free, TSH, Basic metabolic panel, CBC with Differential/Platelet, Ambulatory referral to Neurology  Migraine without aura and without status migrainosus, not intractable - Plan: Ambulatory referral to Neurology Can get td at some point   Headaches appear stable mood appears better only  thing new is abnormal posturing  face and hands  that is unconscious and she is not aware of and does not like.not have an explanation   Question anxiety question related to medication? I would like neuro opinion . Referral to be placed  Cont iron and fu today   Continue same meds at this time 6 mos fu depending on labs   -Patient advised to return or notify health care team  if  new concerns arise.  Patient Instructions  Good to see  you today .  Exam is good  Work on losing weight . Not sure why you have the   postural  hand  mouth expression sx .  I will do referral to neurology to get opinion  ? If medication effect other.  Get the weight back off. Checking the iron today .  And anemia status   Than plan follow up      Standley Brooking. Cid Agena M.D.

## 2021-07-05 NOTE — Telephone Encounter (Signed)
Patient wanted to let Dr. Regis Bill know that she found her gripping her thumb with her left hand during her blood draw just now. She says that they discussed this during her appointment and was told she probably does this under stress. She says that she just noticed herself doing so while under stress and wanted to pass the message along to Dr. Regis Bill.

## 2021-07-05 NOTE — Telephone Encounter (Signed)
noted 

## 2021-07-05 NOTE — Patient Instructions (Addendum)
Good to see  you today .  Exam is good  Work on losing weight . Not sure why you have the   postural  hand  mouth expression sx .  I will do referral to neurology to get opinion  ? If medication effect other.  Get the weight back off. Checking the iron today .  And anemia status   Than plan follow up

## 2021-07-06 ENCOUNTER — Encounter: Payer: Self-pay | Admitting: Neurology

## 2021-07-06 LAB — CBC WITH DIFFERENTIAL/PLATELET
Basophils Absolute: 0 10*3/uL (ref 0.0–0.1)
Basophils Relative: 0.6 % (ref 0.0–3.0)
Eosinophils Absolute: 0.2 10*3/uL (ref 0.0–0.7)
Eosinophils Relative: 2.9 % (ref 0.0–5.0)
HCT: 39.7 % (ref 36.0–46.0)
Hemoglobin: 12.9 g/dL (ref 12.0–15.0)
Lymphocytes Relative: 25.7 % (ref 12.0–46.0)
Lymphs Abs: 2.1 10*3/uL (ref 0.7–4.0)
MCHC: 32.5 g/dL (ref 30.0–36.0)
MCV: 86.4 fl (ref 78.0–100.0)
Monocytes Absolute: 0.6 10*3/uL (ref 0.1–1.0)
Monocytes Relative: 7.7 % (ref 3.0–12.0)
Neutro Abs: 5.2 10*3/uL (ref 1.4–7.7)
Neutrophils Relative %: 63.1 % (ref 43.0–77.0)
Platelets: 273 10*3/uL (ref 150.0–400.0)
RBC: 4.59 Mil/uL (ref 3.87–5.11)
RDW: 14.4 % (ref 11.5–15.5)
WBC: 8.2 10*3/uL (ref 4.0–10.5)

## 2021-07-06 LAB — BASIC METABOLIC PANEL
BUN: 12 mg/dL (ref 6–23)
CO2: 27 mEq/L (ref 19–32)
Calcium: 9.5 mg/dL (ref 8.4–10.5)
Chloride: 103 mEq/L (ref 96–112)
Creatinine, Ser: 0.75 mg/dL (ref 0.40–1.20)
GFR: 84.49 mL/min (ref >60.00)
Glucose, Bld: 107 mg/dL — ABNORMAL HIGH (ref 70–99)
Potassium: 4.1 mEq/L (ref 3.5–5.1)
Sodium: 140 mEq/L (ref 135–145)

## 2021-07-06 LAB — IBC PANEL
Iron: 53 ug/dL (ref 42–145)
Saturation Ratios: 12.9 % — ABNORMAL LOW (ref 20.0–50.0)
TIBC: 410.2 ug/dL (ref 250.0–450.0)
Transferrin: 293 mg/dL (ref 212.0–360.0)

## 2021-07-06 LAB — TSH: TSH: 1.84 u[IU]/mL (ref 0.35–5.50)

## 2021-07-06 LAB — VITAMIN B12: Vitamin B-12: 475 pg/mL (ref 211–911)

## 2021-07-06 LAB — CELIAC DISEASE COMPREHENSIVE PANEL WITH REFLEXES
(tTG) Ab, IgA: <1 U/mL
Immunoglobulin A: 452 mg/dL — ABNORMAL HIGH (ref 70–320)

## 2021-07-06 LAB — HEPATIC FUNCTION PANEL
ALT: 23 U/L (ref 0–35)
AST: 23 U/L (ref 0–37)
Albumin: 4.4 g/dL (ref 3.5–5.2)
Alkaline Phosphatase: 116 U/L (ref 39–117)
Bilirubin, Direct: 0 mg/dL (ref 0.0–0.3)
Total Bilirubin: 0.5 mg/dL (ref 0.2–1.2)
Total Protein: 7.8 g/dL (ref 6.0–8.3)

## 2021-07-06 LAB — T4, FREE: Free T4: 0.61 ng/dL (ref 0.60–1.60)

## 2021-07-07 ENCOUNTER — Ambulatory Visit (INDEPENDENT_AMBULATORY_CARE_PROVIDER_SITE_OTHER): Payer: BC Managed Care – PPO

## 2021-07-07 DIAGNOSIS — J309 Allergic rhinitis, unspecified: Secondary | ICD-10-CM | POA: Diagnosis not present

## 2021-07-10 NOTE — Progress Notes (Signed)
No anemia , iron slightly low but better    b12 nl  iron slightly low .  Liver and blood count normal

## 2021-07-14 ENCOUNTER — Ambulatory Visit (INDEPENDENT_AMBULATORY_CARE_PROVIDER_SITE_OTHER): Payer: BC Managed Care – PPO

## 2021-07-14 DIAGNOSIS — J309 Allergic rhinitis, unspecified: Secondary | ICD-10-CM

## 2021-07-28 ENCOUNTER — Ambulatory Visit (INDEPENDENT_AMBULATORY_CARE_PROVIDER_SITE_OTHER): Payer: BC Managed Care – PPO

## 2021-07-28 DIAGNOSIS — J309 Allergic rhinitis, unspecified: Secondary | ICD-10-CM | POA: Diagnosis not present

## 2021-08-02 DIAGNOSIS — J3081 Allergic rhinitis due to animal (cat) (dog) hair and dander: Secondary | ICD-10-CM

## 2021-08-02 NOTE — Progress Notes (Signed)
VIALS MADE. EXP 08-02-22

## 2021-08-11 ENCOUNTER — Ambulatory Visit (INDEPENDENT_AMBULATORY_CARE_PROVIDER_SITE_OTHER): Payer: BC Managed Care – PPO

## 2021-08-11 DIAGNOSIS — J309 Allergic rhinitis, unspecified: Secondary | ICD-10-CM | POA: Diagnosis not present

## 2021-08-25 ENCOUNTER — Ambulatory Visit (INDEPENDENT_AMBULATORY_CARE_PROVIDER_SITE_OTHER): Payer: BC Managed Care – PPO

## 2021-08-25 DIAGNOSIS — J309 Allergic rhinitis, unspecified: Secondary | ICD-10-CM

## 2021-08-29 ENCOUNTER — Other Ambulatory Visit: Payer: Self-pay | Admitting: Internal Medicine

## 2021-08-29 ENCOUNTER — Other Ambulatory Visit: Payer: Self-pay | Admitting: Allergy & Immunology

## 2021-08-31 ENCOUNTER — Other Ambulatory Visit: Payer: Self-pay

## 2021-08-31 MED ORDER — ATORVASTATIN CALCIUM 80 MG PO TABS: 80.0000 mg | ORAL_TABLET | Freq: Every day | ORAL | 0 refills | Status: DC

## 2021-09-09 ENCOUNTER — Ambulatory Visit (INDEPENDENT_AMBULATORY_CARE_PROVIDER_SITE_OTHER): Payer: BC Managed Care – PPO

## 2021-09-09 DIAGNOSIS — J309 Allergic rhinitis, unspecified: Secondary | ICD-10-CM | POA: Diagnosis not present

## 2021-09-22 ENCOUNTER — Ambulatory Visit (INDEPENDENT_AMBULATORY_CARE_PROVIDER_SITE_OTHER): Payer: BC Managed Care – PPO

## 2021-09-22 DIAGNOSIS — J309 Allergic rhinitis, unspecified: Secondary | ICD-10-CM | POA: Diagnosis not present

## 2021-09-29 NOTE — Progress Notes (Signed)
NEUROLOGY CONSULTATION NOTE  Audrey Jenkins MRN: [id number] DOB: 09-13-1955  Referring provider: Shanon Ace, MD Primary care provider: Shanon Ace, MD  Reason for consult:  abnormal posturing  Assessment/Plan:   Behavioral vis tonic/dystonic tic.  If it becomes more problematic, would recommend referring to psychiatry.   Subjective:  Audrey Jenkins is a 66 year old female with OSA, anxiety/depression, fatty liver, HLD and migraines who presents for abnormal posturing.  History supplemented by referring provider's note.  Without realizing it, her face is sometimes distorted in a frown.  People will ask her why she is angry and then she is aware of it.  This has been going on for about a year and a half.  Over the past year, she will suddenly notice that she has her thumbs between her index and middle finger in a fist.  She is able to easily let go.  Sometimes when she wakes up in the middle of the night with her teeth clenched.  Over the past several months, both eyelids may twitch.  This all happens daily.  She reports that her anxiety and stress are not more severe than typical.      She does not appear to be taking medications that would be a potential cause.  She takes Lexapro for anxiety/depression.  She also takes eletriptan for migraine attacks.  FH - mother MCI, first cousin has MS  PAST MEDICAL HISTORY: Past Medical History:  Diagnosis Date   Abnormal LFTs    Allergy    Anemia    Anxiety    Depression    Fatty liver    on [place] Jan 2011   Headache(784.0)    Hyperlipidemia    Inital 400's   Mood disorder ([place])    Osteoarthritis    Primiparous    Sleep apnea    uses cpap   Snoring    ENT [facility] Jan 2011 nasal obstruction and cryptic tonsils    PAST SURGICAL HISTORY: Past Surgical History:  Procedure Laterality Date   BREAST SURGERY  1995   bx   COLONOSCOPY W/ BIOPSIES  04/26/2020   with removal of 8 polyps(polypectomy   dental implant Left 11/2016   TIBIA  FRACTURE SURGERY     september 2020, no surgery    MEDICATIONS: Current Outpatient Medications on File Prior to Visit  Medication Sig Dispense Refill   acetaminophen (TYLENOL) 500 MG tablet Take 500-1,000 mg by mouth every 6 (six) hours as needed.     atorvastatin (LIPITOR) 80 MG tablet TAKE 1 TABLET BY MOUTH EVERY DAY 90 tablet 0   atorvastatin (LIPITOR) 80 MG tablet Take 1 tablet (80 mg total) by mouth daily. 90 tablet 0   eletriptan (RELPAX) 40 MG tablet TAKE 1 TABLET BY MOUTH AS NEEDED FOR MIGRAINE OR HEADACHE. MAY REPEAT IN 2 HOURS IF NEEDED 9 tablet 1   EPINEPHrine (AUVI-Q) 0.3 mg/0.3 mL IJ SOAJ injection Inject 0.3 mg into the muscle as needed for anaphylaxis. 1 each 1   escitalopram (LEXAPRO) 10 MG tablet TAKE 1 TABLET BY MOUTH EVERY DAY 90 tablet 1   estradiol (ESTRACE) 0.1 MG/GM vaginal cream Place 1 Applicatorful vaginally 3 (three) times a week. PRN. Schedule follow up for further refills. [phone] 42.5 g 0   Ferrous Sulfate 142 (45 Fe) MG TBCR Take 1 tablet by mouth daily.     fexofenadine (ALLEGRA) 180 MG tablet Take 180 mg by mouth daily.     fluticasone (FLONASE) 50 MCG/ACT nasal spray USE  1-2 SPRAYS PER NOSTRIL FOR SINUS ISSUES. 48 mL 2   montelukast (SINGULAIR) 10 MG tablet TAKE 1 TABLET BY MOUTH EVERY DAY AT NIGHT FOR COUGHING/WHEEZING 90 tablet 2   NON FORMULARY 2 injections every week then at 0.50 cc every 2 weeks     Olopatadine HCl 0.2 % SOLN Apply 2 drops to eye daily as needed. 2.5 mL 5   VITAMIN D PO Take by mouth.     No current facility-administered medications on file prior to visit.    ALLERGIES: Allergies  Allergen Reactions   Cetirizine Hcl     REACTION: flu symptoms   Codeine    Pravastatin Sodium     REACTION: Diarrhea   Propoxyphene N-Acetaminophen     REACTION: extreme nausea   Rhinocort [Budesonide]     Nose sores   Zetia [Ezetimibe]     Body aches, and "sick feeling"    FAMILY HISTORY: Family History  Problem Relation Age of Onset    Arthritis Mother    COPD Mother    Dementia Mother    Endometrial cancer Mother    Skin cancer Mother    Osteoporosis Mother    Other Mother        dementia   Heart attack Father    Hypertension Father        cabd Mi 51 died CHF   Heart failure Father    Heart disease Father    Sleep apnea Sister        improved with weight loss   Arthritis Sister    Allergic rhinitis Sister    Coronary artery disease Sister    Vasculitis Sister    Allergic rhinitis Sister    Stomach cancer Maternal Grandfather    Angioedema Neg Hx    Asthma Neg Hx    Eczema Neg Hx    Immunodeficiency Neg Hx    Urticaria Neg Hx    Colon cancer Neg Hx    Colon polyps Neg Hx    Esophageal cancer Neg Hx     Objective:  Blood pressure (!) 136/59, pulse 77, height 5\' 5"  (1.651 m), weight 242 lb 9.6 oz (110 kg), SpO2 96 %. General: No acute distress.  Patient appears well-groomed.   Head:  Normocephalic/atraumatic Eyes:  fundi examined but not visualized Neck: supple, no paraspinal tenderness, full range of motion Back: No paraspinal tenderness Heart: regular rate and rhythm Lungs: Clear to auscultation bilaterally. Vascular: No carotid bruits. Neurological Exam: Mental status: alert and oriented to person, place, and time, recent and remote memory intact, fund of knowledge intact, attention and concentration intact, speech fluent and not dysarthric, language intact. Cranial nerves: CN I: not tested CN II: pupils equal, round and reactive to light, visual fields intact CN III, IV, VI:  full range of motion, no nystagmus, no ptosis CN V: facial sensation intact. CN VII: upper and lower face symmetric CN VIII: hearing intact CN IX, X: gag intact, uvula midline CN XI: sternocleidomastoid and trapezius muscles intact CN XII: tongue midline Bulk & Tone: normal, no fasciculations. Motor:  muscle strength 5/5 throughout Sensation:  Pinprick, temperature and vibratory sensation intact. Deep Tendon  Reflexes:  2+ throughout,  toes downgoing.   Finger to nose testing:  Without dysmetria.   Heel to shin:  Without dysmetria.   Gait:  Normal station and stride.  Romberg negative.    Thank you for allowing me to take part in the care of this patient.  Metta Clines, DO  CC:  Shanon Ace, MD

## 2021-10-01 ENCOUNTER — Other Ambulatory Visit: Payer: Self-pay | Admitting: Internal Medicine

## 2021-10-03 ENCOUNTER — Ambulatory Visit: Payer: BC Managed Care – PPO | Admitting: Neurology

## 2021-10-03 ENCOUNTER — Other Ambulatory Visit: Payer: Self-pay

## 2021-10-03 ENCOUNTER — Encounter: Payer: Self-pay | Admitting: Neurology

## 2021-10-03 VITALS — BP 136/59 | HR 77 | Ht 65.0 in | Wt 242.6 lb

## 2021-10-03 DIAGNOSIS — R253 Fasciculation: Secondary | ICD-10-CM | POA: Diagnosis not present

## 2021-10-14 ENCOUNTER — Ambulatory Visit (INDEPENDENT_AMBULATORY_CARE_PROVIDER_SITE_OTHER): Payer: BC Managed Care – PPO

## 2021-10-14 ENCOUNTER — Other Ambulatory Visit: Payer: Self-pay

## 2021-10-14 DIAGNOSIS — J309 Allergic rhinitis, unspecified: Secondary | ICD-10-CM | POA: Diagnosis not present

## 2021-10-14 MED ORDER — EPINEPHRINE 0.3 MG/0.3ML IJ SOAJ: INTRAMUSCULAR | 1 refills | Status: DC

## 2021-10-14 NOTE — Telephone Encounter (Signed)
Fax received needing refill for Auvi-Q. Medication sent to [place] x 1 with 1 refill.

## 2021-10-20 ENCOUNTER — Ambulatory Visit (INDEPENDENT_AMBULATORY_CARE_PROVIDER_SITE_OTHER): Payer: BC Managed Care – PPO

## 2021-10-20 DIAGNOSIS — J309 Allergic rhinitis, unspecified: Secondary | ICD-10-CM | POA: Diagnosis not present

## 2021-10-28 ENCOUNTER — Ambulatory Visit (INDEPENDENT_AMBULATORY_CARE_PROVIDER_SITE_OTHER): Payer: BC Managed Care – PPO | Admitting: *Deleted

## 2021-10-28 DIAGNOSIS — J309 Allergic rhinitis, unspecified: Secondary | ICD-10-CM

## 2021-11-04 ENCOUNTER — Ambulatory Visit (INDEPENDENT_AMBULATORY_CARE_PROVIDER_SITE_OTHER): Payer: BC Managed Care – PPO

## 2021-11-04 DIAGNOSIS — J309 Allergic rhinitis, unspecified: Secondary | ICD-10-CM

## 2021-11-11 ENCOUNTER — Ambulatory Visit (INDEPENDENT_AMBULATORY_CARE_PROVIDER_SITE_OTHER): Payer: BC Managed Care – PPO

## 2021-11-11 DIAGNOSIS — J309 Allergic rhinitis, unspecified: Secondary | ICD-10-CM

## 2021-12-01 ENCOUNTER — Ambulatory Visit (INDEPENDENT_AMBULATORY_CARE_PROVIDER_SITE_OTHER): Payer: BC Managed Care – PPO

## 2021-12-01 DIAGNOSIS — J309 Allergic rhinitis, unspecified: Secondary | ICD-10-CM | POA: Diagnosis not present

## 2021-12-03 ENCOUNTER — Other Ambulatory Visit: Payer: Self-pay | Admitting: Internal Medicine

## 2021-12-23 ENCOUNTER — Ambulatory Visit (INDEPENDENT_AMBULATORY_CARE_PROVIDER_SITE_OTHER): Payer: BC Managed Care – PPO

## 2021-12-23 DIAGNOSIS — J309 Allergic rhinitis, unspecified: Secondary | ICD-10-CM

## 2022-01-01 ENCOUNTER — Encounter: Payer: Self-pay | Admitting: Internal Medicine

## 2022-01-02 ENCOUNTER — Ambulatory Visit (INDEPENDENT_AMBULATORY_CARE_PROVIDER_SITE_OTHER): Payer: BC Managed Care – PPO

## 2022-01-02 ENCOUNTER — Encounter: Payer: Self-pay | Admitting: Internal Medicine

## 2022-01-02 ENCOUNTER — Ambulatory Visit: Payer: BC Managed Care – PPO | Admitting: Internal Medicine

## 2022-01-02 VITALS — BP 116/74 | HR 64 | Temp 98.6°F | Ht 65.0 in | Wt 240.2 lb

## 2022-01-02 DIAGNOSIS — Z79899 Other long term (current) drug therapy: Secondary | ICD-10-CM

## 2022-01-02 DIAGNOSIS — R0600 Dyspnea, unspecified: Secondary | ICD-10-CM

## 2022-01-02 DIAGNOSIS — M2560 Stiffness of unspecified joint, not elsewhere classified: Secondary | ICD-10-CM

## 2022-01-02 DIAGNOSIS — E611 Iron deficiency: Secondary | ICD-10-CM | POA: Diagnosis not present

## 2022-01-02 DIAGNOSIS — R5383 Other fatigue: Secondary | ICD-10-CM | POA: Diagnosis not present

## 2022-01-02 DIAGNOSIS — G4733 Obstructive sleep apnea (adult) (pediatric): Secondary | ICD-10-CM

## 2022-01-02 DIAGNOSIS — Z9989 Dependence on other enabling machines and devices: Secondary | ICD-10-CM

## 2022-01-02 NOTE — Progress Notes (Signed)
? ?Chief Complaint  ?Patient presents with  ? Follow-up  ? Fatigue  ? ? ?HPI: ?Audrey Jenkins 66 y.o. come in for Chronic disease management   last visit  nov 2022  ?Her biggest problem is that she is tired all the time has decreased exercise tolerance but thinks it could be from being out of shape but it is not getting better despite improvement after taking iron for her iron deficiency anemia. ?Allergy on shots is helped her allergies a good bit no history of documented asthma does get cough question wheezing at times ?Headaches  ?"I am tired ."  Is using her CPAP machine and get :-) is to know that it is okay but her last check was maybe 3 or 4 years ago before COVID.  Cannot say she is better rested first thing in the morning but uncertain if it is related to the CPAP ? ?Neuro no issues to see the neurologist to felt she did not have a neurologic or degenerative disease ?Lexapro continues when tried to wean off in the past had hot flashes and does not want to go through that feeling again.  Does not describe acute panic attacks.    ?Someore winded .  Catches breath.  And sometimes feels like she is not breathing deep.  She used to be able to walk up the stairs without stopping and now she has to stop on every step or 2.  This deterioration is occurred ever since her leg fracture 3 years ago. ?ROS: See pertinent positives and negatives per HPI. ?He has been on atorvastatin for many many years and does not relate any of this to side effects of the atorvastatin. ?He does have morning stiffness and is aware of arthritis is her family had arthritis when they were older. 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 ?Past Medical History:  ?Diagnosis Date  ? Abnormal LFTs   ? Allergy   ? Anemia   ? Anxiety   ? Depression   ? Fatty liver   ? on [place] Jan 2011  ? Headache(784.0)   ? Hyperlipidemia   ? Inital 400's  ? Mood disorder ([place])   ? Osteoarthritis   ? Primiparous   ? Sleep apnea   ? uses cpap  ?  Snoring   ? ENT [facility] Jan 2011 nasal obstruction and cryptic tonsils  ? ? ?Family History  ?Problem Relation Age of Onset  ? Arthritis Mother   ? COPD Mother   ? Dementia Mother   ? Endometrial cancer Mother   ? Skin cancer Mother   ? Osteoporosis Mother   ? Other Mother   ?     dementia  ? Heart attack Father   ? Hypertension Father   ?     cabd Mi 70 died CHF  ? Heart failure Father   ? Heart disease Father   ? Sleep apnea Sister   ?     improved with weight loss  ? Arthritis Sister   ? Allergic rhinitis Sister   ? Coronary artery disease Sister   ? Vasculitis Sister   ? Allergic rhinitis Sister   ? Stomach cancer Maternal Grandfather   ? Dementia Paternal Grandmother   ? Parkinsonism Cousin   ? Angioedema Neg Hx   ? Asthma Neg Hx   ? Eczema Neg Hx   ? Immunodeficiency Neg Hx   ? Urticaria Neg Hx   ? Colon cancer Neg Hx   ? Colon  polyps Neg Hx   ? Esophageal cancer Neg Hx   ? ? ?Social History  ? ?Socioeconomic History  ? Marital status: Married  ?  Spouse name: Not on file  ? Number of children: Not on file  ? Years of education: Not on file  ? Highest education level: Not on file  ?Occupational History  ? Not on file  ?Tobacco Use  ? Smoking status: Never  ? Smokeless tobacco: Never  ?Vaping Use  ? Vaping Use: Never used  ?Substance and Sexual Activity  ? Alcohol use: Not Currently  ?  Alcohol/week: 0.0 standard drinks  ?  Comment: socially  ? Drug use: No  ? Sexual activity: Not on file  ?Other Topics Concern  ? Not on file  ?Social History Narrative  ? Married  ? HH of 2  ? Pet Kittens  ? Works with Geophysicist/field seismologist  ?  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 6 cats  Rescue some older   ? Neg tobacco ets rare etoh  ?   ?   ? ?Social Determinants of Health  ? ?Financial Resource Strain: Not on file  ?Food Insecurity: Not on file  ?Transportation Needs: Not on file  ?Physical Activity: Not on file  ?Stress: Not on file  ?Social Connections: Not on file  ? ? ?Outpatient Medications Prior  to Visit  ?Medication Sig Dispense Refill  ? acetaminophen (TYLENOL) 500 MG tablet Take 500-1,000 mg by mouth every 6 (six) hours as needed.    ? atorvastatin (LIPITOR) 80 MG tablet TAKE 1 TABLET BY MOUTH EVERY DAY 90 tablet 0  ? atorvastatin (LIPITOR) 80 MG tablet Take 1 tablet (80 mg total) by mouth daily. 90 tablet 0  ? eletriptan (RELPAX) 40 MG tablet Take 1 tablet (40 mg total) by mouth every 2 (two) hours as needed for migraine or headache. 9 tablet 1  ? EPINEPHrine (AUVI-Q) 0.3 mg/0.3 mL IJ SOAJ injection Use as directed for severe allergic reactions 1 each 1  ? escitalopram (LEXAPRO) 10 MG tablet TAKE 1 TABLET BY MOUTH EVERY DAY 90 tablet 1  ? estradiol (ESTRACE) 0.1 MG/GM vaginal cream Place 1 Applicatorful vaginally 3 (three) times a week. PRN. Schedule follow up for further refills. [phone] 42.5 g 0  ? Ferrous Sulfate 142 (45 Fe) MG TBCR Take 1 tablet by mouth daily.    ? fexofenadine (ALLEGRA) 180 MG tablet Take 180 mg by mouth daily.    ? fluticasone (FLONASE) 50 MCG/ACT nasal spray USE 1-2 SPRAYS PER NOSTRIL FOR SINUS ISSUES. 48 mL 2  ? montelukast (SINGULAIR) 10 MG tablet TAKE 1 TABLET BY MOUTH EVERY DAY AT NIGHT FOR COUGHING/WHEEZING 90 tablet 2  ? NON FORMULARY 2 injections every week then at 0.50 cc every 2 weeks    ? Olopatadine HCl 0.2 % SOLN Apply 2 drops to eye daily as needed. 2.5 mL 5  ? VITAMIN D PO Take by mouth.    ? ?No facility-administered medications prior to visit.  ? ? ? ?EXAM: ? ?BP 116/74 (BP Location: Left Arm, Patient Position: Sitting, Cuff Size: Normal)   Pulse 64   Temp 98.6 ?F (37 ?C) (Oral)   Ht '5\' 5"'$  (1.651 m)   Wt 240 lb 3.2 oz (109 kg)   SpO2 98%   BMI 39.97 kg/m?  ? ?Body mass index is 39.97 kg/m?. ?Wt Readings from Last 3 Encounters:  ?01/02/22 240 lb 3.2 oz (109 kg)  ?10/03/21 242  lb 9.6 oz (110 kg)  ?07/05/21 238 lb 12.8 oz (108.3 kg)  ? ?BP Readings from Last 3 Encounters:  ?01/02/22 116/74  ?10/03/21 (!) 136/59  ?07/05/21 112/72  ? ? ? ?GENERAL: vitals  reviewed and listed above, alert, oriented, appears well hydrated and in no acute distress ?HEENT: atraumatic, conjunctiva  clear, no obvious abnormalities on inspection of external nose and ears masked ?NECK: no obvious masses on inspection palpation  ?LUNGS: clear to auscultation bilaterally, no wheezes, rales or rhonchi, good air movement ?CV: HRRR, no clubbing cyanosis or  peripheral edema nl cap refill  ?MS: moves all extremities without noticeable focal  abnormality ?PSYCH: pleasant and cooperative, complaining of fatigue but color is good speech is normal occasionally deep breathing ?Lab Results  ?Component Value Date  ? WBC 8.9 01/02/2022  ? HGB 12.4 01/02/2022  ? HCT 37.0 01/02/2022  ? PLT 274.0 01/02/2022  ? GLUCOSE 77 01/02/2022  ? CHOL 245 (H) 03/03/2021  ? TRIG 179.0 (H) 03/03/2021  ? HDL 52.30 03/03/2021  ? LDLDIRECT 157.4 02/19/2014  ? LDLCALC 156 (H) 03/03/2021  ? ALT 18 01/02/2022  ? AST 18 01/02/2022  ? NA 138 01/02/2022  ? K 4.1 01/02/2022  ? CL 105 01/02/2022  ? CREATININE 0.72 01/02/2022  ? BUN 16 01/02/2022  ? CO2 24 01/02/2022  ? TSH 1.54 01/02/2022  ? HGBA1C 5.6 03/03/2021  ? MICROALBUR <0.7 02/10/2020  ? ?BP Readings from Last 3 Encounters:  ?01/02/22 116/74  ?10/03/21 (!) 136/59  ?07/05/21 112/72  ? ?Lab Results  ?Component Value Date  ? [id number] 07/05/2021  ? ? ?ASSESSMENT AND PLAN: ? ?Discussed the following assessment and plan: ? ?Medication management - Plan: Basic metabolic panel, CBC with Differential/Platelet, Hepatic function panel, TSH, T4, free, C-reactive protein, ANA, CK, IBC panel, IBC panel, CK, ANA, C-reactive protein, TSH, T4, free, Hepatic function panel, CBC with Differential/Platelet, Basic metabolic panel ? ?Joint stiffness - Plan: Basic metabolic panel, CBC with Differential/Platelet, Hepatic function panel, TSH, T4, free, C-reactive protein, ANA, CK, IBC panel, IBC panel, CK, ANA, C-reactive protein, TSH, T4, free, Hepatic function panel, CBC with  Differential/Platelet, Basic metabolic panel ? ?Other fatigue - Plan: Basic metabolic panel, CBC with Differential/Platelet, Hepatic function panel, TSH, T4, free, C-reactive protein, ANA, CK, IBC panel, IBC panel, CK, ANA, C-re

## 2022-01-02 NOTE — Patient Instructions (Addendum)
Good to see you \ ?Want you to talk with allergy about  possibility of asthma as a cause.  ?We will  do a cardiology consult. As to causes of shortness of breath .  ?Consider  getting back with sleep apnea  to make sure adequate treatment.  ? ?Try off of atorvastatin for 2 weeks  and see if any difference in the  muscle aches . ? ?Lab and x ray today .  ?Plan follow up depending results of all  above  ? ? ? ? ? ? ?

## 2022-01-03 LAB — IBC PANEL
Iron: 71 ug/dL (ref 42–145)
Saturation Ratios: 19.6 % — ABNORMAL LOW (ref 20.0–50.0)
TIBC: 362.6 ug/dL (ref 250.0–450.0)
Transferrin: 259 mg/dL (ref 212.0–360.0)

## 2022-01-03 LAB — BASIC METABOLIC PANEL
BUN: 16 mg/dL (ref 6–23)
CO2: 24 mEq/L (ref 19–32)
Calcium: 9.3 mg/dL (ref 8.4–10.5)
Chloride: 105 mEq/L (ref 96–112)
Creatinine, Ser: 0.72 mg/dL (ref 0.40–1.20)
GFR: 88.43 mL/min (ref >60.00)
Glucose, Bld: 77 mg/dL (ref 70–99)
Potassium: 4.1 mEq/L (ref 3.5–5.1)
Sodium: 138 mEq/L (ref 135–145)

## 2022-01-03 LAB — T4, FREE: Free T4: 0.61 ng/dL (ref 0.60–1.60)

## 2022-01-03 LAB — CBC WITH DIFFERENTIAL/PLATELET
Basophils Absolute: 0.1 10*3/uL (ref 0.0–0.1)
Basophils Relative: 1.3 % (ref 0.0–3.0)
Eosinophils Absolute: 0.2 10*3/uL (ref 0.0–0.7)
Eosinophils Relative: 2.4 % (ref 0.0–5.0)
HCT: 37 % (ref 36.0–46.0)
Hemoglobin: 12.4 g/dL (ref 12.0–15.0)
Lymphocytes Relative: 28.3 % (ref 12.0–46.0)
Lymphs Abs: 2.5 10*3/uL (ref 0.7–4.0)
MCHC: 33.5 g/dL (ref 30.0–36.0)
MCV: 86.7 fl (ref 78.0–100.0)
Monocytes Absolute: 0.9 10*3/uL (ref 0.1–1.0)
Monocytes Relative: 9.8 % (ref 3.0–12.0)
Neutro Abs: 5.2 10*3/uL (ref 1.4–7.7)
Neutrophils Relative %: 58.2 % (ref 43.0–77.0)
Platelets: 274 10*3/uL (ref 150.0–400.0)
RBC: 4.27 Mil/uL (ref 3.87–5.11)
RDW: 14.2 % (ref 11.5–15.5)
WBC: 8.9 10*3/uL (ref 4.0–10.5)

## 2022-01-03 LAB — C-REACTIVE PROTEIN: CRP: <1 mg/dL (ref 0.5–20.0)

## 2022-01-03 LAB — TSH: TSH: 1.54 u[IU]/mL (ref 0.35–5.50)

## 2022-01-03 LAB — HEPATIC FUNCTION PANEL
ALT: 18 U/L (ref 0–35)
AST: 18 U/L (ref 0–37)
Albumin: 4.3 g/dL (ref 3.5–5.2)
Alkaline Phosphatase: 104 U/L (ref 39–117)
Bilirubin, Direct: 0.1 mg/dL (ref 0.0–0.3)
Total Bilirubin: 0.5 mg/dL (ref 0.2–1.2)
Total Protein: 7.5 g/dL (ref 6.0–8.3)

## 2022-01-03 LAB — CK: Total CK: 74 U/L (ref 7–177)

## 2022-01-05 ENCOUNTER — Encounter: Payer: Self-pay | Admitting: Internal Medicine

## 2022-01-05 ENCOUNTER — Ambulatory Visit: Payer: BC Managed Care – PPO | Admitting: Internal Medicine

## 2022-01-05 VITALS — BP 126/60 | HR 68 | Ht 65.0 in | Wt 241.0 lb

## 2022-01-05 DIAGNOSIS — R0602 Shortness of breath: Secondary | ICD-10-CM

## 2022-01-05 DIAGNOSIS — Z79899 Other long term (current) drug therapy: Secondary | ICD-10-CM

## 2022-01-05 LAB — ANTI-NUCLEAR AB-TITER (ANA TITER)
ANA TITER: 1:80 {titer} — ABNORMAL HIGH
ANA Titer 1: 1:40 {titer} — ABNORMAL HIGH

## 2022-01-05 LAB — ANA: Anti Nuclear Antibody (ANA): POSITIVE — AB

## 2022-01-05 MED ORDER — EZETIMIBE 10 MG PO TABS: 10.0000 mg | ORAL_TABLET | Freq: Every day | ORAL | 3 refills | Status: DC

## 2022-01-05 NOTE — Progress Notes (Signed)
?Cardiology Office Note:   ? ?Date:  01/05/2022  ? ?ID:  Audrey Jenkins, DOB [birth year], MRN [id number] ? ?PCP:  Burnis Medin, MD ?  ?[facility] Providers ?Cardiologist:  Janina Mayo, MD    ? ?Referring MD: Burnis Medin, MD  ? ?No chief complaint on file. ?Fatigue ? ?History of Present Illness:   ? ?Audrey Jenkins is a 66 y.o. female with a hx of OSA on CPAP, HLD, no cardiac hx, referral for fatigue ? ?Recently saw her PCP. She noted chronic fatigue. She says she feels the need to deep breath with activity. She can walk along a park 2/3 of a mile. She stops in between. No chest pressure.This has been 6-8 months. She felt that she couldn't catch her breath.  She had cxray with bibasilar atelectasis.She has iron deficiency anemia on iron therapy. Compliant with her CPAP. She denies angina, dyspnea on exertion. She has ankle swelling that improves after putting her legs up. PND or orthopnea.  She denies presyncope or syncope. Father had an MI at age 54  and heart failure. Paternal GF MI in 4s and deceased by 78s. She is a non smoker. TSH is normal. Hgb 12. ? ? ?Past Medical History:  ?Diagnosis Date  ? Abnormal LFTs   ? Allergy   ? Anemia   ? Anxiety   ? Depression   ? Fatty liver   ? on [place] Jan 2011  ? Headache(784.0)   ? Hyperlipidemia   ? Inital 400's  ? Mood disorder ([place])   ? Osteoarthritis   ? Primiparous   ? Sleep apnea   ? uses cpap  ? Snoring   ? ENT [facility] Jan 2011 nasal obstruction and cryptic tonsils  ? ? ?Past Surgical History:  ?Procedure Laterality Date  ? BREAST SURGERY  1995  ? bx  ? COLONOSCOPY W/ BIOPSIES  04/26/2020  ? with removal of 8 polyps(polypectomy  ? dental implant Left 11/2016  ? TIBIA FRACTURE SURGERY    ? september 2020, no surgery  ? ? ?Current Medications: ?Current Outpatient Medications on File Prior to Visit  ?Medication Sig Dispense Refill  ? acetaminophen (TYLENOL) 500 MG tablet Take 500-1,000 mg by mouth every 6 (six) hours as needed.    ? atorvastatin (LIPITOR) 80 MG  tablet Take 1 tablet (80 mg total) by mouth daily. 90 tablet 0  ? eletriptan (RELPAX) 40 MG tablet Take 1 tablet (40 mg total) by mouth every 2 (two) hours as needed for migraine or headache. 9 tablet 1  ? EPINEPHrine (AUVI-Q) 0.3 mg/0.3 mL IJ SOAJ injection Use as directed for severe allergic reactions 1 each 1  ? escitalopram (LEXAPRO) 10 MG tablet TAKE 1 TABLET BY MOUTH EVERY DAY 90 tablet 1  ? estradiol (ESTRACE) 0.1 MG/GM vaginal cream Place 1 Applicatorful vaginally 3 (three) times a week. PRN. Schedule follow up for further refills. [phone] 42.5 g 0  ? Ferrous Sulfate 142 (45 Fe) MG TBCR Take 1 tablet by mouth daily.    ? fexofenadine (ALLEGRA) 180 MG tablet Take 180 mg by mouth daily.    ? fluticasone (FLONASE) 50 MCG/ACT nasal spray USE 1-2 SPRAYS PER NOSTRIL FOR SINUS ISSUES. 48 mL 2  ? montelukast (SINGULAIR) 10 MG tablet TAKE 1 TABLET BY MOUTH EVERY DAY AT NIGHT FOR COUGHING/WHEEZING 90 tablet 2  ? NON FORMULARY 2 injections every week then at 0.50 cc every 2 weeks    ? Olopatadine HCl 0.2 % SOLN Apply 2  drops to eye daily as needed. 2.5 mL 5  ? VITAMIN D PO Take by mouth.    ? ?No current facility-administered medications on file prior to visit.  ? ? ? ?Allergies:   Cetirizine hcl, Codeine, Pravastatin sodium, Propoxyphene n-acetaminophen, Rhinocort [budesonide], and Zetia [ezetimibe]  ? ?Social History  ? ?Socioeconomic History  ? Marital status: Married  ?  Spouse name: Not on file  ? Number of children: Not on file  ? Years of education: Not on file  ? Highest education level: Not on file  ?Occupational History  ? Not on file  ?Tobacco Use  ? Smoking status: Never  ? Smokeless tobacco: Never  ?Vaping Use  ? Vaping Use: Never used  ?Substance and Sexual Activity  ? Alcohol use: Not Currently  ?  Alcohol/week: 0.0 standard drinks  ?  Comment: socially  ? Drug use: No  ? Sexual activity: Not on file  ?Other Topics Concern  ? Not on file  ?Social History Narrative  ? Married  ? HH of 2  ? Pet  Kittens  ? Works with Geophysicist/field seismologist  ?  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 6 cats  Rescue some older   ? Neg tobacco ets rare etoh  ?   ?   ? ?Social Determinants of Health  ? ?Financial Resource Strain: Not on file  ?Food Insecurity: Not on file  ?Transportation Needs: Not on file  ?Physical Activity: Not on file  ?Stress: Not on file  ?Social Connections: Not on file  ?  ? ?Family History: ?The patient's family history includes Allergic rhinitis in her sister and sister; Arthritis in her mother and sister; COPD in her mother; Coronary artery disease in her sister; Dementia in her mother and paternal grandmother; Endometrial cancer in her mother; Heart attack in her father; Heart disease in her father; Heart failure in her father; Hypertension in her father; Osteoporosis in her mother; Other in her mother; Parkinsonism in her cousin; Skin cancer in her mother; Sleep apnea in her sister; Stomach cancer in her maternal grandfather; Vasculitis in her sister. There is no history of Angioedema, Asthma, Eczema, Immunodeficiency, Urticaria, Colon cancer, Colon polyps, or Esophageal cancer. ? ?ROS:   ?Please see the history of present illness.    ? All other systems reviewed and are negative. ? ?EKGs/Labs/Other Studies Reviewed:   ? ?The following studies were reviewed today: ? ? ?EKG:  EKG is  ordered today.  The ekg ordered today demonstrates  ? ?NSR ? ?Recent Labs: ?01/02/2022: ALT 18; BUN 16; Creatinine, Ser 0.72; Hemoglobin 12.4; Platelets 274.0; Potassium 4.1; Sodium 138; TSH 1.54  ? ?Recent Lipid Panel ?   ?Component Value Date/Time  ? CHOL 245 (H) 03/03/2021 1019  ? TRIG 179.0 (H) 03/03/2021 1019  ? HDL 52.30 03/03/2021 1019  ? CHOLHDL 5 03/03/2021 1019  ? VLDL 35.8 03/03/2021 1019  ? LDLCALC 156 (H) 03/03/2021 1019  ? LDLDIRECT 157.4 02/19/2014 0940  ? ? ? ?Risk Assessment/Calculations:   ?  ? ?    ? ?Physical Exam:   ? ?VS: ?Vitals:  ? 01/05/22 1137  ?BP: 126/60  ?Pulse: 68  ?SpO2:  97%  ? ? ? ?Wt Readings from Last 3 Encounters:  ?01/05/22 241 lb (109.3 kg)  ?01/02/22 240 lb 3.2 oz (109 kg)  ?10/03/21 242 lb 9.6 oz (110 kg)  ?  ? ?GEN:  Well nourished, well developed in no acute distress ?HEENT: Normal ?NECK:  No JVD; No carotid bruits ?LYMPHATICS: No lymphadenopathy ?CARDIAC: RRR, no murmurs, rubs, gallops ?RESPIRATORY:  Clear to auscultation without rales, wheezing or rhonchi  ?ABDOMEN: Soft, non-tender, non-distended ?MUSCULOSKELETAL:  No edema; No deformity  ?SKIN: Warm and dry ?NEUROLOGIC:  Alert and oriented x 3 ?PSYCHIATRIC:  Normal affect  ? ?ASSESSMENT:   ? ?Fatigue: can get an exercise stress to assess capacity as well as can determine if has signs of ischemia. She has no signs of valve disease. No heart failure. Her EKG is normal. ? ?HLD: notes hx of familial HLD. Father had early onset MI. LDL 156 mg/dL in 2022, will add zetia. ? ?PLAN:   ? ?In order of problems listed above: ? ?POET ?Add zetia 10 mg daily ?Recheck fasting lipids in a couple of months ?Follow up in 3 months ? ?   ? ?Shared Decision Making/Informed Consent ?The risks [chest pain, shortness of breath, cardiac arrhythmias, dizziness, blood pressure fluctuations, myocardial infarction, stroke/transient ischemic attack, and life-threatening complications (estimated to be 1 in 10,000)], benefits (risk stratification, diagnosing coronary artery disease, treatment guidance) and alternatives of an exercise tolerance test were discussed in detail with Ms. Henrene Pastor and she agrees to proceed.  ? ? ?Medication Adjustments/Labs and Tests Ordered: ?Current medicines are reviewed at length with the patient today.  Concerns regarding medicines are outlined above.  ?Orders Placed This Encounter  ?Procedures  ? Lipid panel  ? EXERCISE TOLERANCE TEST (ETT)  ? EKG 12-Lead  ? ?Meds ordered this encounter  ?Medications  ? ezetimibe (ZETIA) 10 MG tablet  ?  Sig: Take 1 tablet (10 mg total) by mouth daily.  ?  Dispense:  90 tablet  ?  Refill:   3  ? ? ?Patient Instructions  ?Medication Instructions:  ?START: Zetia 10 mg daily ? ?*If you need a refill on your cardiac medications before your next appointment, please call your pharmacy* ? ? ?Lab Work: ?Darreld Mclean

## 2022-01-05 NOTE — Patient Instructions (Signed)
Medication Instructions:  ?START: Zetia 10 mg daily ? ?*If you need a refill on your cardiac medications before your next appointment, please call your pharmacy* ? ? ?Lab Work: ?Your physician recommends that you return for lab work in July, 2023 (fasting lipid) ? ?If you have labs (blood work) drawn today and your tests are completely normal, you will receive your results only by: ?MyChart Message (if you have MyChart) OR ?A paper copy in the mail ?If you have any lab test that is abnormal or we need to change your treatment, we will call you to review the results. ? ? ?Testing/Procedures: ?Your physician has requested that you have an exercise tolerance test. For further information please visit [email or web address]. Please also follow instruction sheet, as given. ?[place] ? ? ?Follow-Up: ?At [facility], you and your health needs are our priority.  As part of our continuing mission to provide you with exceptional heart care, we have created designated Provider Care Teams.  These Care Teams include your primary Cardiologist (physician) and Advanced Practice Providers (APPs -  Physician Assistants and Nurse Practitioners) who all work together to provide you with the care you need, when you need it. ? ?We recommend signing up for the patient portal called "MyChart".  Sign up information is provided on this After Visit Summary.  MyChart is used to connect with patients for Virtual Visits (Telemedicine).  Patients are able to view lab/test results, encounter notes, upcoming appointments, etc.  Non-urgent messages can be sent to your provider as well.   ?To learn more about what you can do with MyChart, go to [email or web address].   ? ?Your next appointment:   ?3 month(s) ? ?The format for your next appointment:   ?In Person ? ?Provider:   ?Janina Mayo, MD { ? ? ? ?[facility] ?[address], [address] ?[place], [place] [address] ?Phone:   [phone] ? ?  ?  ? ? ?You are scheduled for an Exercise Stress Test  ? ?Please arrive 15 minutes prior to your appointment time for registration and insurance purposes. ? ?The test will take approximately 45 minutes to complete. ? ?How to prepare for your Exercise Stress Test: ?Do bring a list of your current medications with you.  If not listed below, you may take your medications as normal. ?Do wear comfortable clothes (no dresses or overalls) and walking shoes, tennis shoes preferred (no heels or open toed shoes are allowed) ?Do Not wear cologne, perfume, aftershave or lotions (deodorant is allowed). ?Please report to [place], [address] for your test. ? ?If these instructions are not followed, your test will have to be rescheduled. ? ?If you have questions or concerns about your appointment, you can call the Stress Lab at [phone]. ? ?If you cannot keep your appointment, please provide 24 hours notification to the Stress Lab, to avoid a possible $50 charge to your account ? ? ?Important Information About Sugar ? ? ? ? ? ? ?

## 2022-01-10 ENCOUNTER — Encounter (HOSPITAL_COMMUNITY): Payer: Self-pay

## 2022-01-10 ENCOUNTER — Encounter (HOSPITAL_COMMUNITY): Payer: Self-pay | Admitting: *Deleted

## 2022-01-10 NOTE — Progress Notes (Signed)
Per DPR authorization, left detailed instructions for an ETT scheduled for Jan 11, 2022 via MyChart. ?

## 2022-01-11 ENCOUNTER — Ambulatory Visit (HOSPITAL_COMMUNITY)
Admission: RE | Admit: 2022-01-11 | Discharge: 2022-01-11 | Disposition: A | Discharge status: Home / Self Care | Payer: BC Managed Care – PPO | Source: Ambulatory Visit | Attending: Cardiology | Admitting: Cardiology

## 2022-01-11 DIAGNOSIS — R0602 Shortness of breath: Secondary | ICD-10-CM | POA: Insufficient documentation

## 2022-01-11 LAB — EXERCISE TOLERANCE TEST
Angina Index: 0
Duke Treadmill Score: -1
Estimated workload: 6
Exercise duration (min): 4 min
Exercise duration (sec): 12 s
MPHR: 156 {beats}/min
Peak HR: 153 {beats}/min
Percent HR: 98 %
Rest HR: 65 {beats}/min
ST Depression (mm): 1 mm

## 2022-01-13 ENCOUNTER — Other Ambulatory Visit: Payer: Self-pay

## 2022-01-13 DIAGNOSIS — R9439 Abnormal result of other cardiovascular function study: Secondary | ICD-10-CM

## 2022-01-13 MED ORDER — METOPROLOL TARTRATE 50 MG PO TABS: 50.0000 mg | ORAL_TABLET | Freq: Once | ORAL | 0 refills | Status: DC

## 2022-01-17 ENCOUNTER — Ambulatory Visit (INDEPENDENT_AMBULATORY_CARE_PROVIDER_SITE_OTHER): Payer: BC Managed Care – PPO

## 2022-01-17 DIAGNOSIS — J309 Allergic rhinitis, unspecified: Secondary | ICD-10-CM

## 2022-01-19 ENCOUNTER — Encounter: Payer: Self-pay | Admitting: Internal Medicine

## 2022-01-19 NOTE — Progress Notes (Signed)
Clear x ray but large hiatal hernia( can aggravate heartburn or have no symptoms)

## 2022-01-19 NOTE — Progress Notes (Signed)
No new or alarm findings  no anemia ; iron slightly low  C xray report clear expect hiatal hernia .  Plan a follow up visit 2 months after you have had the cardiology testing completed   .

## 2022-01-19 NOTE — Telephone Encounter (Signed)
This is Not collapsed lungs    "atelectasis " can be from  inability or not taking deep breath to in flate lung and  for many reasons.  Not a  diagnosis .   Not alarming  in this situation But , if we want to get more information  we can do a ct scan of the lungs which give better detail  of imaging .   I want you to proceed with the  cardiac testing  in the meantime.    If she agrees   [place] please order a chest /lung ct scan w contrast  Dx shortness of breath. fatigue

## 2022-02-02 ENCOUNTER — Telehealth (HOSPITAL_COMMUNITY): Payer: Self-pay | Admitting: *Deleted

## 2022-02-02 NOTE — Telephone Encounter (Signed)
Patient returning call regarding upcoming cardiac imaging study; pt verbalizes understanding of appt date/time, parking situation and where to check in, pre-test NPO status and medications ordered, and verified current allergies; name and call back number provided for further questions should they arise  Gordy Clement RN Navigator Cardiac Imaging [facility] [phone] office [phone] cell  Patient to take '50mg'$  metoprolol tartrate two hours prior to her cardiac CT scan.  She is aware to arrive at 2pm.

## 2022-02-02 NOTE — Telephone Encounter (Signed)
Attempted to call patient regarding upcoming cardiac CT appointment. Patient did not pick up phone and was unable to leave a message.  Gordy Clement RN Navigator Cardiac Imaging [facility] [phone] Office [phone] Cell

## 2022-02-03 ENCOUNTER — Ambulatory Visit (HOSPITAL_COMMUNITY)
Admission: RE | Admit: 2022-02-03 | Discharge: 2022-02-03 | Disposition: A | Discharge status: Home / Self Care | Payer: BC Managed Care – PPO | Source: Ambulatory Visit | Attending: Internal Medicine | Admitting: Internal Medicine

## 2022-02-03 DIAGNOSIS — R9439 Abnormal result of other cardiovascular function study: Secondary | ICD-10-CM | POA: Diagnosis present

## 2022-02-03 MED ORDER — NITROGLYCERIN 0.4 MG SL SUBL
0.8000 mg | SUBLINGUAL_TABLET | Freq: Once | SUBLINGUAL | Status: AC
  Administered 2022-02-03: 0.8 mg via SUBLINGUAL

## 2022-02-03 MED ORDER — IOHEXOL 350 MG/ML SOLN
100.0000 mL | Freq: Once | INTRAVENOUS | Status: AC | PRN
  Administered 2022-02-03: 100 mL via INTRAVENOUS

## 2022-02-03 MED ORDER — NITROGLYCERIN 0.4 MG SL SUBL
SUBLINGUAL_TABLET | SUBLINGUAL | Status: AC
  Filled 2022-02-03: qty 2

## 2022-02-07 ENCOUNTER — Other Ambulatory Visit: Payer: Self-pay | Admitting: Internal Medicine

## 2022-02-08 ENCOUNTER — Ambulatory Visit (INDEPENDENT_AMBULATORY_CARE_PROVIDER_SITE_OTHER): Payer: BC Managed Care – PPO

## 2022-02-08 DIAGNOSIS — J309 Allergic rhinitis, unspecified: Secondary | ICD-10-CM | POA: Diagnosis not present

## 2022-02-14 NOTE — Progress Notes (Signed)
EXP 02/16/23

## 2022-02-15 DIAGNOSIS — J3081 Allergic rhinitis due to animal (cat) (dog) hair and dander: Secondary | ICD-10-CM

## 2022-02-17 ENCOUNTER — Other Ambulatory Visit: Payer: Self-pay | Admitting: Internal Medicine

## 2022-03-02 ENCOUNTER — Ambulatory Visit (INDEPENDENT_AMBULATORY_CARE_PROVIDER_SITE_OTHER): Payer: BC Managed Care – PPO

## 2022-03-02 DIAGNOSIS — J309 Allergic rhinitis, unspecified: Secondary | ICD-10-CM | POA: Diagnosis not present

## 2022-03-05 ENCOUNTER — Other Ambulatory Visit: Payer: Self-pay | Admitting: Internal Medicine

## 2022-03-08 ENCOUNTER — Encounter: Payer: Self-pay | Admitting: Internal Medicine

## 2022-03-08 ENCOUNTER — Ambulatory Visit: Payer: BC Managed Care – PPO | Admitting: Internal Medicine

## 2022-03-08 VITALS — BP 128/78 | HR 73 | Temp 98.1°F | Resp 16 | Ht 64.5 in | Wt 239.8 lb

## 2022-03-08 DIAGNOSIS — J3089 Other allergic rhinitis: Secondary | ICD-10-CM | POA: Diagnosis not present

## 2022-03-08 MED ORDER — EPINEPHRINE 0.3 MG/0.3ML IJ SOAJ: 0.3000 mg | Freq: Once | INTRAMUSCULAR | 1 refills | Status: AC

## 2022-03-08 MED ORDER — IPRATROPIUM BROMIDE 0.06 % NA SOLN: 2.0000 | Freq: Four times a day (QID) | NASAL | 12 refills | Status: DC

## 2022-03-08 MED ORDER — MONTELUKAST SODIUM 10 MG PO TABS: ORAL_TABLET | ORAL | 1 refills | Status: DC

## 2022-03-08 MED ORDER — FLUTICASONE PROPIONATE 50 MCG/ACT NA SUSP
2.0000 | Freq: Every day | NASAL | 1 refills | Status: DC
Start: 2022-03-08 — End: 2022-06-26

## 2022-03-08 NOTE — Patient Instructions (Addendum)
1. Seasonal and perennial allergic rhinitis - Continue with allergy shots at the same schedule. - We are not going to make any changes at all. - Continue with Flonase one spray per nostril daily. - Continue with Allegra one tablet daily. - Continue with Singulair (montelukast) '10mg'$  daily.  - START atrovent nose spray one spray per nostril as needed for runny nose  - We will plan to continue through May 2026 (five years total).  2. Follow up in 1 year    Please inform us of any Emergency Department visits, hospitalizations, or changes in symptoms. Call us before going to the ED for breathing or allergy symptoms since we might be able to fit you in for a sick visit. Feel free to contact us anytime with any questions, problems, or concerns.  It was a pleasure to meet you today!

## 2022-03-08 NOTE — Progress Notes (Signed)
Follow Up Note  RE: [name] MRN: [id number] DOB: 1956-02-12 Date of Office Visit: 03/08/2022  Referring provider: Burnis Medin, MD Primary care provider: Burnis Medin, MD  Chief Complaint: Allergic Rhinitis   History of Present Illness: I had the pleasure of seeing Audrey Jenkins for a follow up visit at the [facility] [place] [facility] on 03/09/2022. She is a 66 y.o. female, who is being followed for allergic rhinitis on AIT. Her previous allergy office visit was on 11/29/20 with Dr. Ernst Bowler. Today is a regular follow up visit.  History obtained from patient  and  chart review  .  Allergic Rhinitis - Medical therapy: flonase one spray per nostril daily, allegra one tablet daily, singulair 10 mg  - Symptoms: mild breakhrough rhinnorhea  - Adverse effects of medications: denies  - Immunotherapy: Vial 1 ( molds and dust mites).  Vial 2 ( trees, weeds, grasses, cat and dog) Last injection was 0.26m of the RED vial (1/100). She started shots September of 2020 and reached maintenance in May of 2021.   - Large Local Reactions: Denies  - Systemic Reactions: Denies  - Beta Blockers: Denies  - History of Reflux mild reflx  - History of Sinus Surgery denies    Assessment and Plan: Audrey Jenkins a 66y.o. female with: Other allergic rhinitis Plan: Patient Instructions  1. Seasonal and perennial allergic rhinitis - Continue with allergy shots at the same schedule. - We are not going to make any changes at all. - Continue with Flonase one spray per nostril daily. - Continue with Allegra one tablet daily. - Continue with Singulair (montelukast) '10mg'$  daily.  - START atrovent nose spray one spray per nostril as needed for runny nose  - We will plan to continue through May 2026 (five years total).  2. Follow up in 1 year    Please inform [place] any Emergency Department visits, hospitalizations, or changes in symptoms. Call [place] going to the ED for breathing or allergy  symptoms since we might be able to fit you in for a sick visit. Feel free to contact [place] with any questions, problems, or concerns.  It was a pleasure to meet you today!     No follow-ups on file.  Meds ordered this encounter  Medications   fluticasone (FLONASE) 50 MCG/ACT nasal spray    Sig: Place 2 sprays into both nostrils daily.    Dispense:  48 mL    Refill:  1   montelukast (SINGULAIR) 10 MG tablet    Sig: TAKE 1 TABLET BY MOUTH EVERY DAY AT NIGHT FOR COUGHING/WHEEZING    Dispense:  90 tablet    Refill:  1   EPINEPHrine 0.3 mg/0.3 mL IJ SOAJ injection    Sig: Inject 0.3 mg into the muscle once for 1 dose.    Dispense:  1 each    Refill:  1   ipratropium (ATROVENT) 0.06 % nasal spray    Sig: Place 2 sprays into both nostrils 4 (four) times daily.    Dispense:  15 mL    Refill:  12    Lab Orders  No laboratory test(s) ordered today   Diagnostics: None performed    Medication List:  Current Outpatient Medications  Medication Sig Dispense Refill   acetaminophen (TYLENOL) 500 MG tablet Take 500-1,000 mg by mouth every 6 (six) hours as needed.     atorvastatin (LIPITOR) 80 MG tablet TAKE 1 TABLET BY MOUTH EVERY DAY  90 tablet 0   eletriptan (RELPAX) 40 MG tablet TAKE 1 TABLET (40 MG TOTAL) BY MOUTH EVERY 2 (TWO) HOURS AS NEEDED FOR MIGRAINE OR HEADACHE. 9 tablet 1   EPINEPHrine (AUVI-Q) 0.3 mg/0.3 mL IJ SOAJ injection Use as directed for severe allergic reactions 1 each 1   escitalopram (LEXAPRO) 10 MG tablet TAKE 1 TABLET BY MOUTH EVERY DAY 90 tablet 1   estradiol (ESTRACE) 0.1 MG/GM vaginal cream Place 1 Applicatorful vaginally 3 (three) times a week. PRN. Schedule follow up for further refills. [phone] 42.5 g 0   ezetimibe (ZETIA) 10 MG tablet Take 1 tablet (10 mg total) by mouth daily. 90 tablet 3   Ferrous Sulfate 142 (45 Fe) MG TBCR Take 1 tablet by mouth daily.     fexofenadine (ALLEGRA) 180 MG tablet Take 180 mg by mouth daily.     ipratropium  (ATROVENT) 0.06 % nasal spray Place 2 sprays into both nostrils 4 (four) times daily. 15 mL 12   NON FORMULARY 2 injections every week then at 0.50 cc every 2 weeks     Olopatadine HCl 0.2 % SOLN Apply 2 drops to eye daily as needed. 2.5 mL 5   VITAMIN D PO Take by mouth.     fluticasone (FLONASE) 50 MCG/ACT nasal spray Place 2 sprays into both nostrils daily. 48 mL 1   metoprolol tartrate (LOPRESSOR) 50 MG tablet Take 1 tablet (50 mg total) by mouth once for 1 dose. PLEASE TAKE METOPROLOL 2  HOURS PRIOR TO CTA SCAN. 1 tablet 0   montelukast (SINGULAIR) 10 MG tablet TAKE 1 TABLET BY MOUTH EVERY DAY AT NIGHT FOR COUGHING/WHEEZING 90 tablet 1   No current facility-administered medications for this visit.   Allergies: Allergies  Allergen Reactions   Cetirizine Hcl     REACTION: flu symptoms   Codeine    Pravastatin Sodium     REACTION: Diarrhea   Propoxyphene N-Acetaminophen     REACTION: extreme nausea   Rhinocort [Budesonide]     Nose sores   Zetia [Ezetimibe]     Body aches, and "sick feeling"   I reviewed her past medical history, social history, family history, and environmental history and no significant changes have been reported from her previous visit.  ROS: All others negative except as noted per HPI.   Objective: BP 128/78   Pulse 73   Temp 98.1 F (36.7 C) (Temporal)   Resp 16   Ht 5' 4.5" (1.638 m)   Wt 239 lb 12.8 oz (108.8 kg)   SpO2 95%   BMI 40.53 kg/m  Body mass index is 40.53 kg/m. General Appearance:  Alert, cooperative, no distress, appears stated age  Head:  Normocephalic, without obvious abnormality, atraumatic  Eyes:  Conjunctiva clear, EOM's intact  Nose: Nares normal, normal mucosa, no visible anterior polyps, and septum midline  Throat: Lips, tongue normal; teeth and gums normal, normal posterior oropharynx  Neck: Supple, symmetrical  Lungs:   clear to auscultation bilaterally, Respirations unlabored, no coughing  Heart:  regular rate and  rhythm and no murmur, Appears well perfused  Extremities: No edema  Skin: Skin color, texture, turgor normal, no rashes or lesions on visualized portions of skin  Neurologic: No gross deficits   Previous notes and tests were reviewed. The plan was reviewed with the patient/family, and all questions/concerned were addressed.  It was my pleasure to see Audrey Jenkins today and participate in her care. Please feel free to contact me with any questions or concerns.  Sincerely,  Roney Marion, MD  Allergy & Immunology  [facility] [place] [facility]: [address] Office: [phone]

## 2022-03-21 ENCOUNTER — Ambulatory Visit (INDEPENDENT_AMBULATORY_CARE_PROVIDER_SITE_OTHER): Payer: BC Managed Care – PPO

## 2022-03-21 DIAGNOSIS — J309 Allergic rhinitis, unspecified: Secondary | ICD-10-CM | POA: Diagnosis not present

## 2022-03-31 ENCOUNTER — Ambulatory Visit (INDEPENDENT_AMBULATORY_CARE_PROVIDER_SITE_OTHER): Payer: BC Managed Care – PPO | Admitting: *Deleted

## 2022-03-31 DIAGNOSIS — J309 Allergic rhinitis, unspecified: Secondary | ICD-10-CM

## 2022-04-06 ENCOUNTER — Ambulatory Visit (INDEPENDENT_AMBULATORY_CARE_PROVIDER_SITE_OTHER): Payer: BC Managed Care – PPO

## 2022-04-06 DIAGNOSIS — J309 Allergic rhinitis, unspecified: Secondary | ICD-10-CM | POA: Diagnosis not present

## 2022-04-13 ENCOUNTER — Ambulatory Visit (INDEPENDENT_AMBULATORY_CARE_PROVIDER_SITE_OTHER): Payer: BC Managed Care – PPO

## 2022-04-13 ENCOUNTER — Ambulatory Visit: Payer: BC Managed Care – PPO | Admitting: Internal Medicine

## 2022-04-13 DIAGNOSIS — J309 Allergic rhinitis, unspecified: Secondary | ICD-10-CM

## 2022-04-20 ENCOUNTER — Ambulatory Visit (INDEPENDENT_AMBULATORY_CARE_PROVIDER_SITE_OTHER): Payer: BC Managed Care – PPO

## 2022-04-20 DIAGNOSIS — J309 Allergic rhinitis, unspecified: Secondary | ICD-10-CM | POA: Diagnosis not present

## 2022-05-10 ENCOUNTER — Ambulatory Visit (INDEPENDENT_AMBULATORY_CARE_PROVIDER_SITE_OTHER): Payer: BC Managed Care – PPO

## 2022-05-10 DIAGNOSIS — J309 Allergic rhinitis, unspecified: Secondary | ICD-10-CM | POA: Diagnosis not present

## 2022-05-24 ENCOUNTER — Other Ambulatory Visit: Payer: Self-pay | Admitting: Internal Medicine

## 2022-05-31 ENCOUNTER — Ambulatory Visit (INDEPENDENT_AMBULATORY_CARE_PROVIDER_SITE_OTHER): Payer: BC Managed Care – PPO

## 2022-05-31 DIAGNOSIS — J309 Allergic rhinitis, unspecified: Secondary | ICD-10-CM | POA: Diagnosis not present

## 2022-06-16 DIAGNOSIS — J3089 Other allergic rhinitis: Secondary | ICD-10-CM

## 2022-06-19 ENCOUNTER — Ambulatory Visit: Payer: BC Managed Care – PPO | Admitting: Internal Medicine

## 2022-06-20 NOTE — Progress Notes (Signed)
VIALS EXP 06-21-23

## 2022-06-23 ENCOUNTER — Ambulatory Visit (INDEPENDENT_AMBULATORY_CARE_PROVIDER_SITE_OTHER): Payer: BC Managed Care – PPO

## 2022-06-23 DIAGNOSIS — J309 Allergic rhinitis, unspecified: Secondary | ICD-10-CM | POA: Diagnosis not present

## 2022-06-25 ENCOUNTER — Other Ambulatory Visit: Payer: Self-pay | Admitting: Internal Medicine

## 2022-06-27 ENCOUNTER — Ambulatory Visit: Payer: BC Managed Care – PPO | Admitting: Internal Medicine

## 2022-06-27 ENCOUNTER — Encounter: Payer: Self-pay | Admitting: Internal Medicine

## 2022-06-27 VITALS — BP 120/80 | HR 69 | Temp 97.7°F | Wt 239.0 lb

## 2022-06-27 DIAGNOSIS — F439 Reaction to severe stress, unspecified: Secondary | ICD-10-CM

## 2022-06-27 DIAGNOSIS — E785 Hyperlipidemia, unspecified: Secondary | ICD-10-CM

## 2022-06-27 DIAGNOSIS — Z79899 Other long term (current) drug therapy: Secondary | ICD-10-CM | POA: Diagnosis not present

## 2022-06-27 DIAGNOSIS — R5383 Other fatigue: Secondary | ICD-10-CM | POA: Diagnosis not present

## 2022-06-27 DIAGNOSIS — G43009 Migraine without aura, not intractable, without status migrainosus: Secondary | ICD-10-CM

## 2022-06-27 DIAGNOSIS — M2560 Stiffness of unspecified joint, not elsewhere classified: Secondary | ICD-10-CM

## 2022-06-27 DIAGNOSIS — R768 Other specified abnormal immunological findings in serum: Secondary | ICD-10-CM

## 2022-06-27 DIAGNOSIS — M791 Myalgia, unspecified site: Secondary | ICD-10-CM

## 2022-06-27 MED ORDER — CYCLOBENZAPRINE HCL 5 MG PO TABS: 5.0000 mg | ORAL_TABLET | Freq: Every day | ORAL | 0 refills | Status: DC

## 2022-06-27 MED ORDER — MELOXICAM 15 MG PO TABS: 15.0000 mg | ORAL_TABLET | Freq: Every day | ORAL | 0 refills | Status: DC

## 2022-06-27 NOTE — Patient Instructions (Addendum)
Can try off lipitor for a few weeks or month  and see if changes  your symptoms   it is possible that  you could take a lower  dose.  And go back on.  Stress reduction.  Stay on   Consider   trying muscle relaxant at night or similar in case triggering headaches.  Send message   in  a bout a month about how working or not.    And then plan follow up.  Glad your   arteries are clear.

## 2022-06-27 NOTE — Progress Notes (Signed)
Chief Complaint  Patient presents with   Follow-up    Pt is here to follow up after the cardiology test. She wants to discuss if she can be off of lipitor.    Headache    Due to stress. Happens more usual    HPI: Audrey Jenkins 66 y.o. come in for fu  Ct flow angio calcium score 0  stress tess borderline   June 23 Stress took on too much  owrks at vets  and on line classes 6   to help out  Neck is tight  using more  ha meds. Wonders if atova could add to sx . Has joint sx  ana low positive  Has been under allergy care   Stress  stretching and Ha triggers going through  the 9 pills per month  Aches all over if exercises  ina given day the next day.  2 jobs. Causing stress .  Lipitor     has helped   .  Arthritis .  Zetia  not taking ? Caused Achiness .  Vit D 125  Off iron .   Tired and  muscle pain.Tylenol  ROS: See pertinent positives and negatives per HPI.  Past Medical History:  Diagnosis Date   Abnormal LFTs    Allergy    Anemia    Anxiety    Depression    Fatty liver    on [place] Jan 2011   Headache(784.0)    Hyperlipidemia    Inital 400's   Mood disorder ([place])    Osteoarthritis    Primiparous    Sleep apnea    uses cpap   Snoring    ENT [facility]ins Jan 2011 nasal obstruction and cryptic tonsils    Family History  Problem Relation Age of Onset   Arthritis Mother    COPD Mother    Dementia Mother    Endometrial cancer Mother    Skin cancer Mother    Osteoporosis Mother    Other Mother        dementia   Heart attack Father    Hypertension Father        cabd Mi 85 died CHF   Heart failure Father    Heart disease Father    Sleep apnea Sister        improved with weight loss   Arthritis Sister    Allergic rhinitis Sister    Coronary artery disease Sister    Vasculitis Sister    Allergic rhinitis Sister    Stomach cancer Maternal Grandfather    Dementia Paternal Grandmother    Parkinsonism Cousin    Angioedema Neg Hx    Asthma Neg Hx    Eczema Neg  Hx    Immunodeficiency Neg Hx    Urticaria Neg Hx    Colon cancer Neg Hx    Colon polyps Neg Hx    Esophageal cancer Neg Hx     Social History   Socioeconomic History   Marital status: Married    Spouse name: Not on file   Number of children: Not on file   Years of education: Not on file   Highest education level: Not on file  Occupational History   Not on file  Tobacco Use   Smoking status: Never   Smokeless tobacco: Never  Vaping Use   Vaping Use: Never used  Substance and Sexual Activity   Alcohol use: Not Currently    Alcohol/week: 0.0 standard drinks of alcohol  Comment: socially   Drug use: No   Sexual activity: Not on file  Other Topics Concern   Not on file  Social History Narrative   Married   HH of 2   Pet Kittens   Works with Geophysicist/field seismologist    employed [employer]  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     atorvastatin (LIPITOR) 80 MG tablet TAKE 1 TABLET BY MOUTH EVERY DAY 90 tablet 0   eletriptan (RELPAX) 40 MG tablet TAKE 1 TABLET (40 MG TOTAL) BY MOUTH EVERY 2 (TWO) HOURS AS NEEDED FOR MIGRAINE OR HEADACHE. 9 tablet 1   EPINEPHrine (AUVI-Q) 0.3 mg/0.3 mL IJ SOAJ injection Use as directed for severe allergic reactions 1 each 1   escitalopram (LEXAPRO) 10 MG tablet TAKE 1 TABLET BY MOUTH EVERY DAY 90 tablet 1   estradiol (ESTRACE) 0.1 MG/GM vaginal cream Place 1 Applicatorful vaginally 3 (three) times a week. PRN. Schedule follow up for further refills. [phone] 42.5 g 0   Ferrous  Sulfate 142 (45 Fe) MG TBCR Take 1 tablet by mouth daily.     fexofenadine (ALLEGRA) 180 MG tablet Take 180 mg by mouth daily.     fluticasone (FLONASE) 50 MCG/ACT nasal spray SPRAY 2 SPRAYS INTO EACH NOSTRIL EVERY DAY 48 mL 1   ipratropium (ATROVENT) 0.06 % nasal spray Place 2 sprays into both nostrils 4 (four) times daily. 15 mL 12   montelukast (SINGULAIR) 10 MG tablet TAKE 1 TABLET BY MOUTH EVERY DAY AT NIGHT FOR COUGHING/WHEEZING 90 tablet 1   NON FORMULARY 2 injections every week then at 0.50 cc every 2 weeks     VITAMIN D PO Take by mouth.     Olopatadine HCl 0.2 % SOLN Apply 2 drops to eye daily as needed. (Patient not taking: Reported on 06/27/2022) 2.5 mL 5   ezetimibe (ZETIA) 10 MG tablet Take 1 tablet (10 mg total) by mouth daily. (Patient not taking: Reported on 06/27/2022) 90 tablet 3   metoprolol tartrate (LOPRESSOR) 50 MG tablet Take 1 tablet (50 mg total) by mouth once for 1 dose. PLEASE TAKE METOPROLOL 2  HOURS PRIOR TO CTA SCAN. 1 tablet 0   No facility-administered medications prior to visit.     EXAM:  BP 120/80 (BP Location: Left Arm, Patient Position: Sitting, Cuff Size: Large)   Pulse 69   Temp 97.7 F (36.5 C) (Oral)   Wt 239 lb (108.4 kg)   SpO2 97%   BMI 40.39 kg/m   Body mass index is 40.39 kg/m.  GENERAL: vitals reviewed and listed above, alert, oriented, appears well hydrated and in no acute distress HEENT: atraumatic, conjunctiva  clear, no obvious abnormalities on inspection of external nose and ears ONECK: no obvious masses on inspection palpation  MS: moves all extremities without noticeable focal  abnormality PSYCH: pleasant and cooperative, no obvious depression or anxiety Lab Results  Component Value Date   WBC 8.9  01/02/2022   HGB 12.4 01/02/2022   HCT 37.0 01/02/2022   PLT 274.0 01/02/2022   GLUCOSE 77 01/02/2022   CHOL 245 (H) 03/03/2021   TRIG 179.0 (H) 03/03/2021   HDL 52.30 03/03/2021   LDLDIRECT 157.4 02/19/2014   LDLCALC 156 (H)  03/03/2021   ALT 18 01/02/2022   AST 18 01/02/2022   NA 138 01/02/2022   K 4.1 01/02/2022   CL 105 01/02/2022   CREATININE 0.72 01/02/2022   BUN 16 01/02/2022   CO2 24 01/02/2022   TSH 1.54 01/02/2022   HGBA1C 5.6 03/03/2021   MICROALBUR <0.7 02/10/2020   BP Readings from Last 3 Encounters:  06/27/22 120/80  03/08/22 128/78  02/03/22 122/69    ASSESSMENT AND PLAN:  Discussed the following assessment and plan:  Migraine without aura and without status migrainosus, not intractable  Medication management  Joint stiffness  Other fatigue  Hyperlipidemia, unspecified hyperlipidemia type  Stress  Muscular aches  Positive ANA (antinuclear antibody) Fortunately neg coronary flow ct  with hx of hereditary hypercholesterolemia   levels in 400? Has been on high dose atorva for a while.   Consider  se of med  Is taking daily vit D  Has fatigue muscle aches  and stiffness  perfers to with . on any rheum  eval Will try meloxicam instead of tylenol  for ms pain Flexeril trial at night for neck muscle spasm . She will work on stress of  2 jobs. Plan update in a month and go from there about fu  consider future rheum consult.  -Patient advised to return or notify health care team  if  new concerns arise. Record review  Patient Instructions  Can try off lipitor for a few weeks or month  and see if changes  your symptoms   it is possible that  you could take a lower  dose.  And go back on.  Stress reduction.  Stay on   Consider   trying muscle relaxant at night or similar in case triggering headaches.  Send message   in  a bout a month about how working or not.    And then plan follow up.  Glad your   arteries are clear.      Standley Brooking. Shania Bjelland M.D.

## 2022-07-14 ENCOUNTER — Ambulatory Visit (INDEPENDENT_AMBULATORY_CARE_PROVIDER_SITE_OTHER): Payer: BC Managed Care – PPO

## 2022-07-14 DIAGNOSIS — J309 Allergic rhinitis, unspecified: Secondary | ICD-10-CM

## 2022-07-31 ENCOUNTER — Encounter: Payer: Self-pay | Admitting: Internal Medicine

## 2022-08-03 ENCOUNTER — Ambulatory Visit (INDEPENDENT_AMBULATORY_CARE_PROVIDER_SITE_OTHER): Payer: BC Managed Care – PPO

## 2022-08-03 DIAGNOSIS — J309 Allergic rhinitis, unspecified: Secondary | ICD-10-CM | POA: Diagnosis not present

## 2022-08-11 ENCOUNTER — Ambulatory Visit (INDEPENDENT_AMBULATORY_CARE_PROVIDER_SITE_OTHER): Payer: BC Managed Care – PPO

## 2022-08-11 DIAGNOSIS — J309 Allergic rhinitis, unspecified: Secondary | ICD-10-CM

## 2022-08-24 ENCOUNTER — Ambulatory Visit (INDEPENDENT_AMBULATORY_CARE_PROVIDER_SITE_OTHER): Payer: BC Managed Care – PPO

## 2022-08-24 DIAGNOSIS — J309 Allergic rhinitis, unspecified: Secondary | ICD-10-CM

## 2022-09-01 ENCOUNTER — Ambulatory Visit (INDEPENDENT_AMBULATORY_CARE_PROVIDER_SITE_OTHER): Payer: Medicare Other

## 2022-09-01 DIAGNOSIS — J309 Allergic rhinitis, unspecified: Secondary | ICD-10-CM

## 2022-09-15 ENCOUNTER — Ambulatory Visit (INDEPENDENT_AMBULATORY_CARE_PROVIDER_SITE_OTHER): Payer: Medicare Other

## 2022-09-15 DIAGNOSIS — J309 Allergic rhinitis, unspecified: Secondary | ICD-10-CM | POA: Diagnosis not present

## 2022-09-22 ENCOUNTER — Ambulatory Visit (INDEPENDENT_AMBULATORY_CARE_PROVIDER_SITE_OTHER): Payer: Medicare Other

## 2022-09-22 DIAGNOSIS — J309 Allergic rhinitis, unspecified: Secondary | ICD-10-CM

## 2022-10-12 ENCOUNTER — Ambulatory Visit (INDEPENDENT_AMBULATORY_CARE_PROVIDER_SITE_OTHER): Payer: Medicare Other

## 2022-10-12 DIAGNOSIS — J309 Allergic rhinitis, unspecified: Secondary | ICD-10-CM

## 2022-10-23 DIAGNOSIS — J3089 Other allergic rhinitis: Secondary | ICD-10-CM | POA: Diagnosis not present

## 2022-10-23 NOTE — Progress Notes (Signed)
VIALS EXP 10-24-23

## 2022-10-24 DIAGNOSIS — J3081 Allergic rhinitis due to animal (cat) (dog) hair and dander: Secondary | ICD-10-CM

## 2022-11-02 ENCOUNTER — Ambulatory Visit (INDEPENDENT_AMBULATORY_CARE_PROVIDER_SITE_OTHER): Payer: Medicare Other

## 2022-11-02 DIAGNOSIS — J309 Allergic rhinitis, unspecified: Secondary | ICD-10-CM | POA: Diagnosis not present

## 2022-11-23 ENCOUNTER — Ambulatory Visit (INDEPENDENT_AMBULATORY_CARE_PROVIDER_SITE_OTHER): Payer: Medicare Other

## 2022-11-23 DIAGNOSIS — J309 Allergic rhinitis, unspecified: Secondary | ICD-10-CM

## 2022-11-28 ENCOUNTER — Other Ambulatory Visit: Payer: Self-pay | Admitting: Internal Medicine

## 2022-12-02 ENCOUNTER — Other Ambulatory Visit: Payer: Self-pay | Admitting: Internal Medicine

## 2022-12-15 ENCOUNTER — Ambulatory Visit (INDEPENDENT_AMBULATORY_CARE_PROVIDER_SITE_OTHER): Payer: Medicare Other

## 2022-12-15 DIAGNOSIS — J309 Allergic rhinitis, unspecified: Secondary | ICD-10-CM

## 2023-01-05 ENCOUNTER — Ambulatory Visit (INDEPENDENT_AMBULATORY_CARE_PROVIDER_SITE_OTHER): Payer: Medicare Other

## 2023-01-05 DIAGNOSIS — J309 Allergic rhinitis, unspecified: Secondary | ICD-10-CM | POA: Diagnosis not present

## 2023-01-11 ENCOUNTER — Ambulatory Visit (INDEPENDENT_AMBULATORY_CARE_PROVIDER_SITE_OTHER): Payer: Medicare Other

## 2023-01-11 DIAGNOSIS — J309 Allergic rhinitis, unspecified: Secondary | ICD-10-CM

## 2023-01-16 ENCOUNTER — Ambulatory Visit (INDEPENDENT_AMBULATORY_CARE_PROVIDER_SITE_OTHER): Payer: Medicare Other

## 2023-01-16 DIAGNOSIS — J309 Allergic rhinitis, unspecified: Secondary | ICD-10-CM | POA: Diagnosis not present

## 2023-01-26 ENCOUNTER — Ambulatory Visit (INDEPENDENT_AMBULATORY_CARE_PROVIDER_SITE_OTHER): Payer: Medicare Other

## 2023-01-26 DIAGNOSIS — J309 Allergic rhinitis, unspecified: Secondary | ICD-10-CM

## 2023-02-02 ENCOUNTER — Ambulatory Visit (INDEPENDENT_AMBULATORY_CARE_PROVIDER_SITE_OTHER): Payer: Medicare Other

## 2023-02-02 DIAGNOSIS — J309 Allergic rhinitis, unspecified: Secondary | ICD-10-CM

## 2023-02-16 ENCOUNTER — Ambulatory Visit (INDEPENDENT_AMBULATORY_CARE_PROVIDER_SITE_OTHER): Payer: Medicare Other

## 2023-02-16 DIAGNOSIS — J309 Allergic rhinitis, unspecified: Secondary | ICD-10-CM

## 2023-02-25 ENCOUNTER — Other Ambulatory Visit: Payer: Self-pay | Admitting: Family

## 2023-02-27 ENCOUNTER — Other Ambulatory Visit: Payer: Self-pay | Admitting: Internal Medicine

## 2023-02-27 DIAGNOSIS — R9439 Abnormal result of other cardiovascular function study: Secondary | ICD-10-CM

## 2023-03-12 ENCOUNTER — Ambulatory Visit (INDEPENDENT_AMBULATORY_CARE_PROVIDER_SITE_OTHER): Payer: Medicare Other | Admitting: *Deleted

## 2023-03-12 DIAGNOSIS — J309 Allergic rhinitis, unspecified: Secondary | ICD-10-CM | POA: Diagnosis not present

## 2023-03-14 ENCOUNTER — Encounter: Payer: Self-pay | Admitting: Internal Medicine

## 2023-03-15 MED ORDER — ESCITALOPRAM OXALATE 10 MG PO TABS
10.0000 mg | ORAL_TABLET | Freq: Every day | ORAL | 0 refills | Status: DC
Start: 2023-03-15 — End: 2023-06-11

## 2023-03-21 ENCOUNTER — Encounter: Payer: Self-pay | Admitting: Internal Medicine

## 2023-03-21 ENCOUNTER — Ambulatory Visit: Payer: Medicare Other | Admitting: Internal Medicine

## 2023-03-21 VITALS — BP 130/76 | HR 68 | Temp 98.2°F | Ht 64.5 in | Wt 235.8 lb

## 2023-03-21 DIAGNOSIS — G43009 Migraine without aura, not intractable, without status migrainosus: Secondary | ICD-10-CM | POA: Diagnosis not present

## 2023-03-21 DIAGNOSIS — E785 Hyperlipidemia, unspecified: Secondary | ICD-10-CM | POA: Diagnosis not present

## 2023-03-21 DIAGNOSIS — F439 Reaction to severe stress, unspecified: Secondary | ICD-10-CM | POA: Diagnosis not present

## 2023-03-21 DIAGNOSIS — Z79899 Other long term (current) drug therapy: Secondary | ICD-10-CM | POA: Diagnosis not present

## 2023-03-21 DIAGNOSIS — Z8249 Family history of ischemic heart disease and other diseases of the circulatory system: Secondary | ICD-10-CM

## 2023-03-21 DIAGNOSIS — T887XXA Unspecified adverse effect of drug or medicament, initial encounter: Secondary | ICD-10-CM

## 2023-03-21 MED ORDER — FLUTICASONE PROPIONATE 50 MCG/ACT NA SUSP: 2.0000 | Freq: Every day | NASAL | 3 refills | Status: DC

## 2023-03-21 MED ORDER — ELETRIPTAN HYDROBROMIDE 40 MG PO TABS
40.0000 mg | ORAL_TABLET | ORAL | 1 refills | Status: DC | PRN
Start: 2023-03-21 — End: 2023-05-16

## 2023-03-21 NOTE — Progress Notes (Signed)
Chief Complaint  Patient presents with   Medication Refill   Medical Management of Chronic Issues    Pt reports she got off of Lipitor 80mg  last November. Pt states it cause her to have muscle pain, legs weakness. Would like to discuss on alternative.     HPI: Audrey Jenkins 67 y.o. come in for Chronic disease management   HLD  was placed on atorvastin 80 and was over 400  and confirmed .  Has been on for decades  had one t time trial on pravastatin . Doesn't want a stroke and optimize risk reduction but  wants to avoid se  .   Muscles and bones  sore and hard to walk. Was discussing char lifts  weak  .  And finally tried off in the fall and within 1-2 weeks felt a lot better   muscle pain Fam hx of heart diease in father and PGF  tc had been in 400 range   or high 300s   Last year n went for heart test flow and  calcium score  was zero and no flow obstruction  .. had large HH    Just finished teaching and retire  which will decrease  stress Family member with cv dementia and helping out directing and is stressful   has used flexeril at night as needed and sleeps so much better with less muscle tightness . Asks for refills when due at more in bottle . 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    ROS: See pertinent positives and negatives per HPI.  Past Medical History:  Diagnosis Date   Abnormal LFTs    Allergy    Anemia    Anxiety    Depression    Fatty liver    on [place] Jan 2011   Headache(784.0)    Hyperlipidemia    Inital 400's   Mood disorder (HCC)    Osteoarthritis    Primiparous    Sleep apnea    uses cpap   Snoring    [facility] Jan 2011 nasal obstruction and cryptic tonsils    Family History  Problem Relation Age of Onset   Arthritis Mother    COPD Mother    Dementia Mother    Endometrial cancer Mother    Skin cancer Mother    Osteoporosis Mother     Other Mother        dementia   Heart attack Father    Hypertension Father        cabd Mi 27 died CHF   Heart failure Father    Heart disease Father    Sleep apnea Sister        improved with weight loss   Arthritis Sister    Allergic rhinitis Sister    Coronary artery disease Sister    Vasculitis Sister    Allergic rhinitis Sister    Stomach cancer Maternal Grandfather    Dementia Paternal Grandmother    Parkinsonism Cousin    Angioedema Neg Hx    Asthma Neg Hx    Eczema Neg Hx    Immunodeficiency Neg Hx    Urticaria Neg Hx    Colon cancer Neg Hx    Colon polyps Neg Hx    Esophageal cancer Neg Hx     Social History   Socioeconomic History   Marital  status: Married    Spouse name: Not on file   Number of children: Not on file   Years of education: Not on file   Highest education level: Master's degree (e.g., MA, MS, MEng, MEd, MSW, MBA)  Occupational History   Not on file  Tobacco Use   Smoking status: Never   Smokeless tobacco: Never  Vaping Use   Vaping status: Never Used  Substance and Sexual Activity   Alcohol use: Not Currently    Alcohol/week: 0.0 standard drinks of alcohol    Comment: socially   Drug use: No   Sexual activity: Not on file  Other Topics Concern   Not on file  Social History Narrative   Married   HH of 2   Pet Kittens   Works with Architectural technologist    employed [employer]  Teaches  Full time    6 cats  Rescue some older    Neg tobacco ets rare etoh         Social Determinants of Health   Financial Resource Strain: Low Risk  (03/20/2023)   Overall Financial Resource Strain (CARDIA)    Difficulty of Paying Living Expenses: Not hard at all  Food Insecurity: No Food Insecurity (03/20/2023)   Hunger Vital Sign    Worried About Running Out of Food in the Last Year: Never true    Ran Out of Food in the Last Year: Never true  Transportation Needs: No Transportation Needs (03/20/2023)   PRAPARE - Doctor, general practice (Medical): No    Lack of Transportation (Non-Medical): No  Physical Activity: Insufficiently Active (03/20/2023)   Exercise Vital Sign    Days of Exercise per Week: 1 day    Minutes of Exercise per Session: 10 min  Stress: Stress Concern Present (03/20/2023)   [employer] of Occupational Health - Occupational Stress Questionnaire    Feeling of Stress : Very much  Social Connections: Moderately Isolated (03/20/2023)   Social Connection and Isolation Panel [NHANES]    Frequency of Communication with Friends and Family: Twice a week    Frequency of Social Gatherings with Friends and Family: Never    Attends Religious Services: Never    Database administrator or Organizations: Yes    Attends Engineer, structural: 1 to 4 times per year    Marital Status: Married    Outpatient Medications Prior to Visit  Medication Sig Dispense Refill   acetaminophen (TYLENOL) 500 MG tablet Take 500-1,000 mg by mouth every 6 (six) hours as needed.     cyclobenzaprine (FLEXERIL) 5 MG tablet TAKE 1 TABLET (5 MG TOTAL) BY MOUTH AT BEDTIME. FOR MUSCLE SPASM 30 tablet 0   EPINEPHrine (AUVI-Q) 0.3 mg/0.3 mL IJ SOAJ injection Use as directed for severe allergic reactions 1 each 1   escitalopram (LEXAPRO) 10 MG tablet Take 1 tablet (10 mg total) by mouth daily. 90 tablet 0   estradiol (ESTRACE) 0.1 MG/GM vaginal cream Place 1 Applicatorful vaginally 3 (three) times a week. PRN. Schedule follow up for further refills. [phone] 42.5 g 0   ipratropium (ATROVENT) 0.06 % nasal spray Place 2 sprays into both nostrils 4 (four) times daily. 15 mL 12   meloxicam (MOBIC) 15 MG tablet Take 1 tablet (15 mg total) by mouth daily. As needed for arthritis . 30 tablet 0   montelukast (SINGULAIR) 10 MG tablet TAKE 1 TABLET BY MOUTH EVERY DAY AT NIGHT FOR COUGHING/WHEEZING 90 tablet 1  NON FORMULARY 2 injections every week then at 0.50 cc every 2 weeks. Now at 4 wks. Allergy shot.     VITAMIN D PO  Take by mouth.     eletriptan (RELPAX) 40 MG tablet TAKE 1 TABLET (40 MG TOTAL) BY MOUTH EVERY 2 (TWO) HOURS AS NEEDED FOR MIGRAINE OR HEADACHE. 9 tablet 1   fluticasone (FLONASE) 50 MCG/ACT nasal spray SPRAY 2 SPRAYS INTO EACH NOSTRIL EVERY DAY 48 mL 1   atorvastatin (LIPITOR) 80 MG tablet TAKE 1 TABLET BY MOUTH EVERY DAY (Patient not taking: Reported on 03/21/2023) 90 tablet 0   Ferrous Sulfate 142 (45 Fe) MG TBCR Take 1 tablet by mouth daily.     fexofenadine (ALLEGRA) 180 MG tablet Take 180 mg by mouth daily. (Patient not taking: Reported on 03/21/2023)     Olopatadine HCl 0.2 % SOLN Apply 2 drops to eye daily as needed. (Patient not taking: Reported on 03/21/2023) 2.5 mL 5   No facility-administered medications prior to visit.     EXAM:  BP 130/76 (BP Location: Left Arm, Patient Position: Sitting, Cuff Size: Large)   Pulse 68   Temp 98.2 F (36.8 C) (Oral)   Ht 5' 4.5" (1.638 m)   Wt 235 lb 12.8 oz (107 kg)   SpO2 94%   BMI 39.85 kg/m   Body mass index is 39.85 kg/m.  GENERAL: vitals reviewed and listed above, alert, oriented, appears well hydrated and in no acute distress HEENT: atraumatic, conjunctiva  clear, no obvious abnormalities on inspection of external nose and ears NECK: no obvious masses on inspection palpation  LUNGS: clear to auscultation bilaterally, no wheezes, rales or rhonchi, good air movement CV: HRRR, no clubbing cyanosis or  peripheral edema nl cap refill  MS: moves all extremities without noticeable focal  abnormality PSYCH: pleasant and cooperative, no obvious depression or anxiety Lab Results  Component Value Date   WBC 8.9 01/02/2022   HGB 12.4 01/02/2022   HCT 37.0 01/02/2022   PLT 274.0 01/02/2022   GLUCOSE 77 01/02/2022   CHOL 245 (H) 03/03/2021   TRIG 179.0 (H) 03/03/2021   HDL 52.30 03/03/2021   LDLDIRECT 157.4 02/19/2014   LDLCALC 156 (H) 03/03/2021   ALT 18 01/02/2022   AST 18 01/02/2022   NA 138 01/02/2022   K 4.1 01/02/2022   CL 105  01/02/2022   CREATININE 0.72 01/02/2022   BUN 16 01/02/2022   CO2 24 01/02/2022   TSH 1.54 01/02/2022   HGBA1C 5.6 03/03/2021   MICROALBUR <0.7 02/10/2020   BP Readings from Last 3 Encounters:  03/21/23 130/76  06/27/22 120/80  03/08/22 128/78   Record review  ASSESSMENT AND PLAN:  Discussed the following assessment and plan:  Hyperlipidemia, unspecified hyperlipidemia type - Plan: Basic metabolic panel, CBC with Differential/Platelet, Hemoglobin A1c, Hepatic function panel, Lipid panel, TSH, Lipoprotein A (LPA)  Medication management - Plan: Basic metabolic panel, CBC with Differential/Platelet, Hemoglobin A1c, Hepatic function panel, Lipid panel, TSH, Lipoprotein A (LPA)  Stress - Plan: Basic metabolic panel, CBC with Differential/Platelet, Hemoglobin A1c, Hepatic function panel, Lipid panel, TSH, Lipoprotein A (LPA)  Migraine without aura and without status migrainosus, not intractable - Plan: Basic metabolic panel, CBC with Differential/Platelet, Hemoglobin A1c, Hepatic function panel, Lipid panel, TSH, Lipoprotein A (LPA)  Family history of heart disease - Plan: Basic metabolic panel, CBC with Differential/Platelet, Hemoglobin A1c, Hepatic function panel, Lipid panel, TSH, Lipoprotein A (LPA)  Medication side effect - Plan: Basic metabolic panel, CBC with Differential/Platelet, Hemoglobin  A1c, Hepatic function panel, Lipid panel, TSH, Lipoprotein A (LPA) Update fasting lab order at [facility]  ( off statin  ) add lipo a   Consider low dose crestor  Consider lipid clinic  to discuss risk benefit of other meds  Good result on cardiac flow studies from 2023 considering high TC and strong family hx albeit males  She is worried about stroke prevention. Ok to continue flexeril at night as needed  benefit seems more than risk at this time    -Patient advised to return or notify health care team  if  new concerns arise.  Patient Instructions  Good to see you today .  Plan fasting lab  at [facility] .. Consider other options  interventions want no se at this time.  Then go from there . Consider seeing LIPID clinic .if we need more expert opinion . Med more benefit than risk.  Will refill  muscle relaxant at night if needed with caution. Plan follow up.  Depending.    Audrey Jenkins. Audrey Jenkins M.D.

## 2023-03-21 NOTE — Patient Instructions (Addendum)
Good to see you today .  Plan fasting lab at [facility] .. Consider other options  interventions want no se at this time.  Then go from there . Consider seeing LIPID clinic .if we need more expert opinion . Med more benefit than risk.  Will refill  muscle relaxant at night if needed with caution. Plan follow up.  Depending.

## 2023-03-22 ENCOUNTER — Other Ambulatory Visit (INDEPENDENT_AMBULATORY_CARE_PROVIDER_SITE_OTHER): Payer: Medicare Other

## 2023-03-22 DIAGNOSIS — Z79899 Other long term (current) drug therapy: Secondary | ICD-10-CM

## 2023-03-22 DIAGNOSIS — G43009 Migraine without aura, not intractable, without status migrainosus: Secondary | ICD-10-CM

## 2023-03-22 DIAGNOSIS — E785 Hyperlipidemia, unspecified: Secondary | ICD-10-CM | POA: Diagnosis not present

## 2023-03-22 DIAGNOSIS — Z8249 Family history of ischemic heart disease and other diseases of the circulatory system: Secondary | ICD-10-CM

## 2023-03-22 DIAGNOSIS — F439 Reaction to severe stress, unspecified: Secondary | ICD-10-CM

## 2023-03-22 DIAGNOSIS — T887XXA Unspecified adverse effect of drug or medicament, initial encounter: Secondary | ICD-10-CM

## 2023-03-22 LAB — CBC WITH DIFFERENTIAL/PLATELET
Basophils Absolute: 0 10*3/uL (ref 0.0–0.1)
Basophils Relative: 0.7 % (ref 0.0–3.0)
Eosinophils Absolute: 0.3 10*3/uL (ref 0.0–0.7)
Eosinophils Relative: 3.7 % (ref 0.0–5.0)
HCT: 39.2 % (ref 36.0–46.0)
Hemoglobin: 12.5 g/dL (ref 12.0–15.0)
Lymphocytes Relative: 27.4 % (ref 12.0–46.0)
Lymphs Abs: 1.8 10*3/uL (ref 0.7–4.0)
MCHC: 31.9 g/dL (ref 30.0–36.0)
MCV: 86 fl (ref 78.0–100.0)
Monocytes Absolute: 0.6 10*3/uL (ref 0.1–1.0)
Monocytes Relative: 8.9 % (ref 3.0–12.0)
Neutro Abs: 4 10*3/uL (ref 1.4–7.7)
Neutrophils Relative %: 59.3 % (ref 43.0–77.0)
Platelets: 296 10*3/uL (ref 150.0–400.0)
RBC: 4.56 Mil/uL (ref 3.87–5.11)
RDW: 14.4 % (ref 11.5–15.5)
WBC: 6.7 10*3/uL (ref 4.0–10.5)

## 2023-03-22 LAB — LIPID PANEL
Cholesterol: 438 mg/dL — ABNORMAL HIGH (ref 0–200)
HDL: 55.8 mg/dL (ref >39.00)
LDL Cholesterol: 345 mg/dL — ABNORMAL HIGH (ref 0–99)
NonHDL: 382.66
Total CHOL/HDL Ratio: 8
Triglycerides: 187 mg/dL — ABNORMAL HIGH (ref 0.0–149.0)
VLDL: 37.4 mg/dL (ref 0.0–40.0)

## 2023-03-22 LAB — HEPATIC FUNCTION PANEL
ALT: 31 U/L (ref 0–35)
AST: 25 U/L (ref 0–37)
Albumin: 4.1 g/dL (ref 3.5–5.2)
Alkaline Phosphatase: 123 U/L — ABNORMAL HIGH (ref 39–117)
Bilirubin, Direct: 0.1 mg/dL (ref 0.0–0.3)
Total Bilirubin: 0.6 mg/dL (ref 0.2–1.2)
Total Protein: 7.5 g/dL (ref 6.0–8.3)

## 2023-03-22 LAB — BASIC METABOLIC PANEL
BUN: 11 mg/dL (ref 6–23)
CO2: 27 mEq/L (ref 19–32)
Calcium: 9.5 mg/dL (ref 8.4–10.5)
Chloride: 98 mEq/L (ref 96–112)
Creatinine, Ser: 0.7 mg/dL (ref 0.40–1.20)
GFR: 90.69 mL/min (ref >60.00)
Glucose, Bld: 88 mg/dL (ref 70–99)
Potassium: 3.8 mEq/L (ref 3.5–5.1)
Sodium: 132 mEq/L — ABNORMAL LOW (ref 135–145)

## 2023-03-22 LAB — TSH: TSH: 2.05 u[IU]/mL (ref 0.35–5.50)

## 2023-03-22 LAB — HEMOGLOBIN A1C: Hgb A1c MFr Bld: 5.6 % (ref 4.6–6.5)

## 2023-03-26 NOTE — Progress Notes (Signed)
So indeed the cholesterol is up to 438 off the atorvastatin.  I advise we try at first low dose Crestor  10 mg 3 days a week and increase to daily as tolerated disp 90 . In the interim I advise a referral to the LIPID clinic  or cardiology referral to Dr Rennis Golden team regarding best management  since she cannot tolerated the 80 mg of atorvastatin.    Plan recheck  fasting lipid panel  Vit d  level alk phos  ggt and bmp in 3 months.

## 2023-03-28 ENCOUNTER — Encounter: Payer: Self-pay | Admitting: Internal Medicine

## 2023-03-28 DIAGNOSIS — E785 Hyperlipidemia, unspecified: Secondary | ICD-10-CM

## 2023-03-28 NOTE — Progress Notes (Signed)
Lipo a is moderately elevated ;confirms importance of optimizing cholesterol lowering  See previous results  and make sure there is referral tp either lipid clinic or  cardiology

## 2023-04-09 DIAGNOSIS — J3081 Allergic rhinitis due to animal (cat) (dog) hair and dander: Secondary | ICD-10-CM | POA: Diagnosis not present

## 2023-04-09 NOTE — Progress Notes (Signed)
Vials Exp 04/08/24

## 2023-04-10 DIAGNOSIS — J3089 Other allergic rhinitis: Secondary | ICD-10-CM | POA: Diagnosis not present

## 2023-04-11 ENCOUNTER — Ambulatory Visit (INDEPENDENT_AMBULATORY_CARE_PROVIDER_SITE_OTHER): Payer: Medicare Other

## 2023-04-11 DIAGNOSIS — J309 Allergic rhinitis, unspecified: Secondary | ICD-10-CM

## 2023-05-05 ENCOUNTER — Other Ambulatory Visit: Payer: Self-pay | Admitting: Internal Medicine

## 2023-05-09 ENCOUNTER — Ambulatory Visit (INDEPENDENT_AMBULATORY_CARE_PROVIDER_SITE_OTHER): Payer: Self-pay

## 2023-05-09 DIAGNOSIS — J309 Allergic rhinitis, unspecified: Secondary | ICD-10-CM | POA: Diagnosis not present

## 2023-05-16 ENCOUNTER — Other Ambulatory Visit: Payer: Self-pay | Admitting: Internal Medicine

## 2023-06-09 ENCOUNTER — Other Ambulatory Visit: Payer: Self-pay | Admitting: Internal Medicine

## 2023-06-11 ENCOUNTER — Ambulatory Visit (INDEPENDENT_AMBULATORY_CARE_PROVIDER_SITE_OTHER): Payer: Medicare Other

## 2023-06-11 DIAGNOSIS — J309 Allergic rhinitis, unspecified: Secondary | ICD-10-CM | POA: Diagnosis not present

## 2023-06-21 ENCOUNTER — Other Ambulatory Visit: Payer: Self-pay | Admitting: Internal Medicine

## 2023-06-22 ENCOUNTER — Other Ambulatory Visit: Payer: Self-pay | Admitting: Medical Genetics

## 2023-06-22 DIAGNOSIS — Z006 Encounter for examination for normal comparison and control in clinical research program: Secondary | ICD-10-CM

## 2023-07-12 ENCOUNTER — Ambulatory Visit (INDEPENDENT_AMBULATORY_CARE_PROVIDER_SITE_OTHER): Payer: Medicare Other

## 2023-07-12 DIAGNOSIS — J309 Allergic rhinitis, unspecified: Secondary | ICD-10-CM

## 2023-07-16 ENCOUNTER — Ambulatory Visit (INDEPENDENT_AMBULATORY_CARE_PROVIDER_SITE_OTHER): Payer: Medicare Other | Admitting: Internal Medicine

## 2023-07-16 ENCOUNTER — Ambulatory Visit: Payer: Self-pay

## 2023-07-16 ENCOUNTER — Encounter: Payer: Self-pay | Admitting: Internal Medicine

## 2023-07-16 VITALS — BP 134/70 | HR 83 | Temp 97.7°F | Resp 18 | Ht 63.7 in | Wt 234.1 lb

## 2023-07-16 DIAGNOSIS — J3089 Other allergic rhinitis: Secondary | ICD-10-CM | POA: Diagnosis not present

## 2023-07-16 MED ORDER — IPRATROPIUM BROMIDE 0.06 % NA SOLN: 2.0000 | Freq: Four times a day (QID) | NASAL | 12 refills | Status: DC

## 2023-07-16 MED ORDER — MONTELUKAST SODIUM 10 MG PO TABS: ORAL_TABLET | ORAL | 1 refills | Status: DC

## 2023-07-16 MED ORDER — EPINEPHRINE 0.3 MG/0.3ML IJ SOAJ: INTRAMUSCULAR | 1 refills | Status: AC

## 2023-07-16 MED ORDER — FLUTICASONE PROPIONATE 50 MCG/ACT NA SUSP: 2.0000 | Freq: Every day | NASAL | 3 refills | Status: AC

## 2023-07-16 NOTE — Progress Notes (Signed)
Follow Up Note  RE: Audrey Jenkins MRN: [id number] DOB: Dec 26, 1955 Date of Office Visit: 07/16/2023  Referring provider: Madelin Headings, MD Primary care provider: Madelin Headings, MD  Chief Complaint: Allergies (Doing good. Need refill on Ipratropium & Epi-pen)  History of Present Illness: I had the pleasure of seeing Audrey Jenkins for a follow up visit at the [facility] on 07/16/2023. She is a 67 y.o. female, who is being followed for allergic rhinitis on AIT. Her previous allergy office visit was on 07/16/23 with Dr. Dellis Anes. Today is a regular follow up visit.  History obtained from patient  and  chart review  .  Allergic Rhinitis - Medical therapy: flonase one spray per nostril daily, allegra one tablet daily, singulair 10 mg, atovent 1 spray as needed (works wonder for rhinorhea per patient)  - Adverse effects of medications: denies  - Immunotherapy: Vial 1 ( molds and dust mites).  Vial 2 ( trees, weeds, grasses, cat and dog) Last injection was 0.46mL of the RED vial (1/100). She started shots September of 2020 and reached maintenance in May of 2021.   - Large Local Reactions: Denies  - Systemic Reactions: Denies  - Beta Blockers: Denies  - History of Reflux mild reflx  - History of Sinus Surgery denies   She is very pleased with response to AIT.  Needs refills of epipen, atrovent.   Assessment and Plan: Audrey Jenkins is a 67 y.o. female with: Other allergic rhinitis Plan: Patient Instructions  1. Seasonal and perennial allergic rhinitis - Continue with allergy shots at the same schedule. - We are not going to make any changes at all. - Continue with Flonase one spray per nostril daily. - Continue with Allegra one tablet daily. - Continue with Singulair (montelukast) 10mg  daily.  - Continue atrovent nose spray one spray per nostril as needed for runny nose  - We will plan to continue through May 2026 (five years total).  2. Follow up in 1 year     Please inform us of any Emergency Department visits, hospitalizations, or changes in symptoms. Call us before going to the ED for breathing or allergy symptoms since we might be able to fit you in for a sick visit. Feel free to contact us anytime with any questions, problems, or concerns.  It was a pleasure to meet you today!    No follow-ups on file.  No orders of the defined types were placed in this encounter.   Lab Orders  No laboratory test(s) ordered today   Diagnostics: None performed    Medication List:  Current Outpatient Medications  Medication Sig Dispense Refill   acetaminophen (TYLENOL) 500 MG tablet Take 500-1,000 mg by mouth every 6 (six) hours as needed.     cyclobenzaprine (FLEXERIL) 5 MG tablet TAKE 1 TABLET (5 MG TOTAL) BY MOUTH AT BEDTIME. FOR MUSCLE SPASM 30 tablet 0   eletriptan (RELPAX) 40 MG tablet TAKE 1 TABLET (40 MG TOTAL) BY MOUTH EVERY 2 (TWO) HOURS AS NEEDED FOR MIGRAINE OR HEADACHE. 9 tablet 1   EPINEPHrine (AUVI-Q) 0.3 mg/0.3 mL IJ SOAJ injection Use as directed for severe allergic reactions 1 each 1   escitalopram (LEXAPRO) 10 MG tablet TAKE 1 TABLET BY MOUTH EVERY DAY 90 tablet 0   estradiol (ESTRACE) 0.1 MG/GM vaginal cream Place 1 Applicatorful vaginally 3 (three) times a week. PRN. Schedule follow up for further refills. [phone] 42.5 g 0   fluticasone (FLONASE) 50 MCG/ACT nasal  spray Place 2 sprays into both nostrils daily. 48 mL 3   ipratropium (ATROVENT) 0.06 % nasal spray Place 2 sprays into both nostrils 4 (four) times daily. 15 mL 12   meloxicam (MOBIC) 15 MG tablet Take 1 tablet (15 mg total) by mouth daily. As needed for arthritis . 30 tablet 0   montelukast (SINGULAIR) 10 MG tablet TAKE 1 TABLET BY MOUTH EVERY DAY AT NIGHT FOR COUGHING/WHEEZING 90 tablet 1   NON FORMULARY 2 injections every week then at 0.50 cc every 2 weeks. Now at 4 wks. Allergy shot.     VITAMIN D PO Take by mouth.     No current facility-administered  medications for this visit.   Allergies: Allergies  Allergen Reactions   Cetirizine Hcl     REACTION: flu symptoms   Codeine    Pravastatin Sodium     REACTION: Diarrhea   Propoxyphene N-Acetaminophen     REACTION: extreme nausea   Rhinocort [Budesonide]     Nose sores   Zetia [Ezetimibe]     Body aches, and "sick feeling"   I reviewed her past medical history, social history, family history, and environmental history and no significant changes have been reported from her previous visit.  ROS: All others negative except as noted per HPI.   Objective: BP 134/70 (BP Location: Right Arm, Patient Position: Sitting, Cuff Size: Large)   Pulse 83   Temp 97.7 F (36.5 C) (Temporal)   Resp 18   Ht 5' 3.7" (1.618 m)   Wt 234 lb 1.6 oz (106.2 kg)   SpO2 97%   BMI 40.56 kg/m  Body mass index is 40.56 kg/m. General Appearance:  Alert, cooperative, no distress, appears stated age  Head:  Normocephalic, without obvious abnormality, atraumatic  Eyes:  Conjunctiva clear, EOM's intact  Nose: Nares normal, normal mucosa, no visible anterior polyps, and septum midline  Throat: Lips, tongue normal; teeth and gums normal, normal posterior oropharynx  Neck: Supple, symmetrical  Lungs:   clear to auscultation bilaterally, Respirations unlabored, no coughing  Heart:  regular rate and rhythm and no murmur, Appears well perfused  Extremities: No edema  Skin: Skin color, texture, turgor normal, no rashes or lesions on visualized portions of skin  Neurologic: No gross deficits   Previous notes and tests were reviewed. The plan was reviewed with the patient/family, and all questions/concerned were addressed.  It was my pleasure to see Audrey Jenkins today and participate in her care. Please feel free to contact me with any questions or concerns.  Sincerely,  Ferol Luz, MD  Allergy & Immunology  [facility] Office: [phone]

## 2023-07-16 NOTE — Patient Instructions (Addendum)
1. Seasonal and perennial allergic rhinitis - Continue with allergy shots at the same schedule. - We are not going to make any changes at all. - Continue with Flonase one spray per nostril daily. - Continue with Allegra one tablet daily. - Continue with Singulair (montelukast) 10mg  daily.  - Continue atrovent nose spray one spray per nostril as needed for runny nose  - We will plan to continue through May 2026 (five years total).  2. Follow up in 1 year    Please inform us of any Emergency Department visits, hospitalizations, or changes in symptoms. Call us before going to the ED for breathing or allergy symptoms since we might be able to fit you in for a sick visit. Feel free to contact us anytime with any questions, problems, or concerns.  It was a pleasure to meet you today!

## 2023-07-18 ENCOUNTER — Other Ambulatory Visit: Payer: Self-pay | Admitting: Internal Medicine

## 2023-07-20 ENCOUNTER — Ambulatory Visit: Payer: Medicare Other | Attending: Cardiovascular Disease | Admitting: Internal Medicine

## 2023-07-20 ENCOUNTER — Encounter: Payer: Self-pay | Admitting: Internal Medicine

## 2023-07-20 ENCOUNTER — Telehealth: Payer: Self-pay | Admitting: Internal Medicine

## 2023-07-20 VITALS — BP 134/70 | Ht 64.5 in | Wt 234.0 lb

## 2023-07-20 DIAGNOSIS — E7801 Familial hypercholesterolemia: Secondary | ICD-10-CM | POA: Insufficient documentation

## 2023-07-20 DIAGNOSIS — T466X5A Adverse effect of antihyperlipidemic and antiarteriosclerotic drugs, initial encounter: Secondary | ICD-10-CM | POA: Insufficient documentation

## 2023-07-20 DIAGNOSIS — M791 Myalgia, unspecified site: Secondary | ICD-10-CM | POA: Diagnosis not present

## 2023-07-20 NOTE — Progress Notes (Signed)
Virtual Visit via Video Note   Because of Audrey Jenkins's co-morbid illnesses, she is at least at moderate risk for complications without adequate follow up.  This format is felt to be most appropriate for this patient at this time.  All issues noted in this document were discussed and addressed.  A limited physical exam was performed with this format.  Please refer to the patient's chart for her consent to telehealth for [facility].      Date:  07/20/2023   ID:  Audrey Jenkins, DOB 28-Nov-1955, MRN [id number] The patient was identified using 2 identifiers.  Evaluation Performed:  New Patient Evaluation  Patient Location:  [address] [place] [address]  Provider location:   [address], [address] [place], [place] [address]  PCP:  Madelin Headings, MD  Cardiologist:  Maisie Fus, MD Electrophysiologist:  None   Chief Complaint:  Familial hyperlipidemia  History of Present Illness:    Audrey Jenkins is a 67 y.o. female who presents via audio/video conferencing for a telehealth visit today.  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  She is known about high cholesterol since her 48s.  She started on Mevacor at 32 and was on this for a number of years and recently had tried atorvastatin and pravastatin with rising cholesterol levels.  Unfortunately she had intolerance to these including myalgias.  Subsequently she had recently been tried on ezetimibe but had side effects with that as well.  She had been having some chest discomfort and underwent exercise treadmill stress testing which was equivalent.  She then was referred for coronary CT angiography.  Surprisingly this showed no coronary artery disease and no coronary calcification.  It is unclear whether this might be related to long-term statin use.  She has outlived both her father who had an MI in his 32s and her grandfather.  She does not  have any children.  She has not previously had genetic testing but is interested in doing the [employer] genetic testing offered by the health system.  We talked today about that testing and some limitations including the fact that it only looks at the 4 major causes of familial hyperlipidemia and that if the testing is inconclusive or negative, would not necessarily exclude the diagnosis of familial hyperlipidemia.  We also discussed possible alternative treatment options today including PCSK9 inhibitors, both the antibody therapies and RNA inhibitors.  Overall she reports a diet trying to watch saturated fat intake.  Prior CV studies:   The following studies were reviewed today:  Chart reviewed, lab work  PMHx:  Past Medical History:  Diagnosis Date   Abnormal LFTs    Allergy    Anemia    Anxiety    Depression    Fatty liver    on [place] Jan 2011   Headache(784.0)    Hyperlipidemia    Inital 400's   Mood disorder (HCC)    Osteoarthritis    Primiparous    Sleep apnea    uses cpap   Snoring    ENT [facility] Jan 2011 nasal obstruction and cryptic tonsils    Past Surgical History:  Procedure Laterality Date   BREAST SURGERY  1995   bx   COLONOSCOPY W/ BIOPSIES  04/26/2020   with removal of 8 polyps(polypectomy   dental implant Left 11/2016   TIBIA FRACTURE SURGERY     september 2020, no surgery  FAMHx:  Family History  Problem Relation Age of Onset   Arthritis Mother    COPD Mother    Dementia Mother    Endometrial cancer Mother    Skin cancer Mother    Osteoporosis Mother    Other Mother        dementia   Heart attack Father    Hypertension Father        cabd Mi 26 died CHF   Heart failure Father    Heart disease Father    Sleep apnea Sister        improved with weight loss   Arthritis Sister    Allergic rhinitis Sister    Coronary artery disease Sister    Vasculitis Sister    Allergic rhinitis Sister    Stomach cancer Maternal Grandfather    Dementia Paternal  Grandmother    Parkinsonism Cousin    Angioedema Neg Hx    Asthma Neg Hx    Eczema Neg Hx    Immunodeficiency Neg Hx    Urticaria Neg Hx    Colon cancer Neg Hx    Colon polyps Neg Hx    Esophageal cancer Neg Hx     SOCHx:   reports that she has never smoked. She has never used smokeless tobacco. She reports that she does not currently use alcohol. She reports that she does not use drugs.  ALLERGIES:  Allergies  Allergen Reactions   Atorvastatin Other (See Comments)    Muscle aches, soreness in legs   Cetirizine Hcl     REACTION: flu symptoms   Codeine    Pravastatin Sodium     REACTION: Diarrhea   Propoxyphene N-Acetaminophen     REACTION: extreme nausea   Rhinocort [Budesonide]     Nose sores   Zetia [Ezetimibe]     Body aches, and "sick feeling"    MEDS:  Current Meds  Medication Sig   acetaminophen (TYLENOL) 500 MG tablet Take 500-1,000 mg by mouth every 6 (six) hours as needed.   cyclobenzaprine (FLEXERIL) 5 MG tablet TAKE 1 TABLET (5 MG TOTAL) BY MOUTH AT BEDTIME. FOR MUSCLE SPASM   eletriptan (RELPAX) 40 MG tablet TAKE 1 TABLET (40 MG TOTAL) BY MOUTH EVERY 2 (TWO) HOURS AS NEEDED FOR MIGRAINE OR HEADACHE.   EPINEPHrine (AUVI-Q) 0.3 mg/0.3 mL IJ SOAJ injection Use as directed for severe allergic reactions   escitalopram (LEXAPRO) 10 MG tablet TAKE 1 TABLET BY MOUTH EVERY DAY   estradiol (ESTRACE) 0.1 MG/GM vaginal cream Place 1 Applicatorful vaginally 3 (three) times a week. PRN. Schedule follow up for further refills. [phone]   fluticasone (FLONASE) 50 MCG/ACT nasal spray Place 2 sprays into both nostrils daily.   ipratropium (ATROVENT) 0.06 % nasal spray Place 2 sprays into both nostrils 4 (four) times daily.   montelukast (SINGULAIR) 10 MG tablet TAKE 1 TABLET BY MOUTH EVERY DAY AT NIGHT FOR COUGHING/WHEEZING   NON FORMULARY 2 injections every week then at 0.50 cc every 2 weeks. Now at 4 wks. Allergy shot.   VITAMIN D PO Take by mouth.      ROS: Pertinent items noted in HPI and remainder of comprehensive ROS otherwise negative.  Labs/Other Tests and Data Reviewed:    Recent Labs: 03/22/2023: ALT 31; BUN 11; Creatinine, Ser 0.70; Hemoglobin 12.5; Platelets 296.0; Potassium 3.8; Sodium 132; TSH 2.05   Recent Lipid Panel Lab Results  Component Value Date/Time   CHOL 438 (H) 03/22/2023 01:00 PM   TRIG 187.0 (H) 03/22/2023 01:00  PM   HDL 55.80 03/22/2023 01:00 PM   CHOLHDL 8 03/22/2023 01:00 PM   LDLCALC 345 (H) 03/22/2023 01:00 PM   LDLDIRECT 157.4 02/19/2014 09:40 AM    Wt Readings from Last 3 Encounters:  07/20/23 234 lb (106.1 kg)  07/16/23 234 lb 1.6 oz (106.2 kg)  03/21/23 235 lb 12.8 oz (107 kg)     Exam:    Vital Signs:  BP 134/70   Ht 5' 4.5" (1.638 m)   Wt 234 lb (106.1 kg)   BMI 39.55 kg/m    General appearance: alert, no distress, and moderately obese Lungs: No visual respiratory difficulty Abdomen: Obese Extremities: extremities normal, atraumatic, no cyanosis or edema Neurologic: Grossly normal  ASSESSMENT & PLAN:    Definite familial hyperlipidemia based on Simon Broome criteria, LDL greater than 300 untreated Family history of high cholesterol and early onset heart disease in her father and grandfather Elevated LP(a) of 107 nmol/L 0 coronary calcium and no detectable coronary artery disease by cardiac CT (01/2022)  Ms. Threlkeld has definite familial hyperlipidemia based on the [employer] criteria.  Her untreated LDL a few months ago was 345.  Unfortunately she could not tolerate statins due to myalgias but was on a statin for many years.  At this point she also recently tried ezetimibe but had intolerance to that.  She is a good candidate for PCSK9 inhibitors and I think her insurance would be favorable for Leqvio.  Will try to get her on that if possible but otherwise plan for the antibody PCSK9 inhibitor therapies.  We discussed that just because there is no coronary artery disease does not  mean that she is at continued risk and that perhaps her absence of coronary disease may be related to long-term statin use.  It is unclear why she has developed intolerances later in life but she should derive benefit from the PCSK9 inhibitors.  She is also noted to have a mildly elevated LP(a).  Hopefully that will be reduced as well.  Will plan repeat lipids including NMR and LP(a) after starting therapy.  Thanks as always for the kind referral.  Patient Risk:   After full review of this patients clinical status, I feel that they are at least moderate risk at this time.  Time:   Today, I have spent 25 minutes with the patient with telehealth technology discussing familial hyperlipidemia.     Medication Adjustments/Labs and Tests Ordered: Current medicines are reviewed at length with the patient today.  Concerns regarding medicines are outlined above.   Tests Ordered: Orders Placed This Encounter  Procedures   NMR, lipoprofile   Lipoprotein A (LPA)    Medication Changes: No orders of the defined types were placed in this encounter.   Disposition:  in 6 month(s)  Chrystie Nose, MD, [facility], FACP  [place]  [facility]  Medical Director of the Advanced Lipid Disorders &  Cardiovascular Risk Reduction Clinic Diplomate of the American Board of Clinical Lipidology Attending Cardiologist  Direct Dial: [phone]  Fax: [phone]  Website:  [email or web address]  Chrystie Nose, MD  07/20/2023 9:04 AM

## 2023-07-20 NOTE — Patient Instructions (Addendum)
Medication Instructions:  Dr. Rennis Golden has recommended an injectable medication called LEQVIO. This is administered by a health care provider. The frequency of injections is TWO injections given 3 months apart (loading dose) and then every 6 months after that. The injection appointments at [facility] ([address] [place], [place]  [address]). Once we have the benefits check information, we will reach out to let you know if the medication is covered 100%, if there is a deductible, co-insurance, out-of-pocket max. From there, we will see if you need patient assistance and our team will take care of working on this. Because of the frequency schedule of this medication, your follow up/repeat cholesterol lab work will be about 5-6 months from now.    If Audrey Jenkins is cost-prohibitive OR denied by insurance, Dr. Rennis Golden suggested Repaha Sureclick This is an injectable cholesterol medication self-administered once every 14 days. This medication will likely need prior approval with your insurance company, which we will work on. If the medication is not approved initially, we may need to do an appeal with your insurance.   Administer medication in area of fatty tissue such as abdomen, outer thigh, back of upper arm - and rotate site with each injection Store medication in refrigerator until ready to administer - allow to sit at room temp for 30 mins - 1 hour prior to injection Dispose of medication in a SHARPS container - your pharmacy should be able to direct you on this and proper disposal   The [facility] offers assistance to help pay for medication copays.  They will cover copays for all cholesterol lowering meds, including statins, fibrates, omega-3 fish oils like Vascepa, ezetimibe, Repatha, Praluent, Nexletol, Nexlizet.  The cards are usually good for $2,500 or 12 months, whichever comes first. Our fax # is [phone] (you will need this to apply) Go to  [email or web address] Click on "Apply Now" Answer questions as to whom is applying (patient or representative) Your disease fund will be "hypercholesterolemia - Medicare access" They will ask questions about finances and which medications you are taking for cholesterol When you submit, the approval is usually within minutes.  You will need to print the card information from the site You will need to show this information to your pharmacy, they will bill your Medicare Part D plan first -then bill Health Well --for the copay.   You can also call them at [phone], although the hold times can be quite long.      Lab Work: FASTING lab work to check cholesterol in 5-6 months (Leqvio) OR 3-4 months (Repatha) NMR lipoprofile, LPa  If you have labs (blood work) drawn today and your tests are completely normal, you will receive your results only by: MyChart Message (if you have MyChart) OR A paper copy in the mail If you have any lab test that is abnormal or we need to change your treatment, we will call you to review the results.   Follow-Up: At [facility], you and your health needs are our priority.  As part of our continuing mission to provide you with exceptional heart care, we have created designated Provider Care Teams.  These Care Teams include your primary Cardiologist (physician) and Advanced Practice Providers (APPs -  Physician Assistants and Nurse Practitioners) who all work together to provide you with the care you need, when you need it.  We recommend signing up for the patient portal called "MyChart".  Sign up information is provided on this  After Visit Summary.  MyChart is used to connect with patients for Virtual Visits (Telemedicine).  Patients are able to view lab/test results, encounter notes, upcoming appointments, etc.  Non-urgent messages can be sent to your provider as well.   To learn more about what you can do with MyChart, go to [email or web address].     Your next appointment:    5-6 months with plan for Leqvio is successful 3-4 months if we need to change to [employer]

## 2023-07-20 NOTE — Telephone Encounter (Signed)
Patient has been identified as candidate for Leqvio  Benefits investigation enrollment completed on 07/20/23  Benefits investigation report notes the following:  Type of insurance: Medicare/BCBS [facility]

## 2023-07-25 ENCOUNTER — Ambulatory Visit (INDEPENDENT_AMBULATORY_CARE_PROVIDER_SITE_OTHER): Payer: Medicare Other

## 2023-07-25 DIAGNOSIS — J309 Allergic rhinitis, unspecified: Secondary | ICD-10-CM | POA: Diagnosis not present

## 2023-07-30 NOTE — Telephone Encounter (Signed)
Update on benefits investigation sent to patient in MyChart

## 2023-08-02 ENCOUNTER — Encounter: Payer: Self-pay | Admitting: Internal Medicine

## 2023-08-02 ENCOUNTER — Telehealth: Payer: Self-pay | Admitting: Pharmacy Technician

## 2023-08-02 ENCOUNTER — Other Ambulatory Visit (HOSPITAL_COMMUNITY): Payer: Self-pay

## 2023-08-02 NOTE — Telephone Encounter (Addendum)
Dr. Rennis Golden / Eileen Stanford,  Patient has Medicare A/B primary insurance and BCBS as secondary. Patient will be responsible for the remaining 20%  BCBS has denied due to patient has not failed or tried preferred medication  Repatha Praluent.  The denial has been scanned to the media tab for your review. You have the right to appeal if you would like to.    Auth Submission: DENIED Site of care: Site of care: [facility] Payer: BCBS Medication & CPT/J Code(s) submitted: Leqvio (Inclisiran) [phone] Route of submission (phone, fax, portal):  Phone # Fax # Auth type: Buy/Bill PB Units/visits requested:  Reference number: [id number] Approval from:  to

## 2023-08-02 NOTE — Telephone Encounter (Signed)
Leqvio PA denied. Message sent to patient, as Repatha/Praluent [facility]) is preferred and was discussed during video visit

## 2023-08-02 NOTE — Telephone Encounter (Signed)
Update sent to patient on PA denial

## 2023-08-02 NOTE — Telephone Encounter (Signed)
-----   Message from Nurse Eileen Stanford E sent at 08/02/2023  8:24 AM EST ----- Regarding: PA for PCSK9i - repatha 140 or praluent 150 Hey team   This patient needs a PA for Repatha 140 or Praluent 150 q2weeks  Was denied Leqvio  Had a video visit/lipid consult a few weeks ago.   Thanks! Eileen Stanford RN

## 2023-08-02 NOTE — Telephone Encounter (Signed)
Chrystie Nose, MD  Amalia Greenhouse, CPhT; Lindell Spar, RN Caller: Unspecified (Today,  7:57 AM) Rip Harbour to proceed with antibody PCSK9i therapy if she is agreeable given Leqvio denial  Dr Rennis Golden

## 2023-08-02 NOTE — Telephone Encounter (Signed)
Pharmacy Patient Advocate Encounter   Received notification from Physician's Office that prior authorization for repatha is required/requested.   Insurance verification completed.   The patient is insured through [facility] .   Per test claim: PA required; PA submitted to above mentioned insurance via CoverMyMeds Key/confirmation #/EOC [id number] Status is pending

## 2023-08-03 ENCOUNTER — Other Ambulatory Visit (HOSPITAL_COMMUNITY): Payer: Self-pay

## 2023-08-03 MED ORDER — REPATHA SURECLICK 140 MG/ML ~~LOC~~ SOAJ: 140.0000 mg | SUBCUTANEOUS | 3 refills | Status: DC

## 2023-08-03 NOTE — Telephone Encounter (Signed)
Pharmacy Patient Advocate Encounter  Received notification from [facility] [employer] that Prior Authorization for repatha has been APPROVED from 08/02/23 to 07/31/24. Ran test claim, Copay is $47.00- one month. This test claim was processed through [facility]- copay amounts may vary at other pharmacies due to pharmacy/plan contracts, or as the patient moves through the different stages of their insurance plan.   PA #/Case ID/Reference #: [id number]

## 2023-08-03 NOTE — Telephone Encounter (Signed)
Rx(s) sent to pharmacy electronically.  

## 2023-08-03 NOTE — Telephone Encounter (Signed)
Update sent to patient in MyChart 

## 2023-08-03 NOTE — Addendum Note (Signed)
Addended by: Lindell Spar on: 08/03/2023 08:52 AM   Modules accepted: Orders

## 2023-08-06 ENCOUNTER — Telehealth: Payer: Self-pay | Admitting: Pharmacy Technician

## 2023-08-06 ENCOUNTER — Other Ambulatory Visit: Payer: Self-pay

## 2023-08-06 NOTE — Telephone Encounter (Signed)
Patient will proceed with Repatha. [facility])

## 2023-08-07 ENCOUNTER — Encounter: Payer: Self-pay | Admitting: Internal Medicine

## 2023-08-08 ENCOUNTER — Encounter: Payer: Self-pay | Admitting: Internal Medicine

## 2023-08-08 ENCOUNTER — Ambulatory Visit (INDEPENDENT_AMBULATORY_CARE_PROVIDER_SITE_OTHER): Payer: Medicare Other | Admitting: Internal Medicine

## 2023-08-08 VITALS — BP 128/60 | HR 69 | Temp 97.8°F | Ht 63.8 in | Wt 232.6 lb

## 2023-08-08 DIAGNOSIS — Z23 Encounter for immunization: Secondary | ICD-10-CM | POA: Diagnosis not present

## 2023-08-08 DIAGNOSIS — Z79899 Other long term (current) drug therapy: Secondary | ICD-10-CM

## 2023-08-08 DIAGNOSIS — Z0000 Encounter for general adult medical examination without abnormal findings: Secondary | ICD-10-CM | POA: Diagnosis not present

## 2023-08-08 DIAGNOSIS — H9192 Unspecified hearing loss, left ear: Secondary | ICD-10-CM | POA: Diagnosis not present

## 2023-08-08 DIAGNOSIS — F439 Reaction to severe stress, unspecified: Secondary | ICD-10-CM

## 2023-08-08 DIAGNOSIS — E2839 Other primary ovarian failure: Secondary | ICD-10-CM

## 2023-08-08 NOTE — Progress Notes (Signed)
Subjective:    Audrey Jenkins is a 67 y.o. female who presents for a Welcome to Medicare exam.   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  Repatha first.  Feels much better off statin Stress related to POA financial  for elderly vet  with dementia and trying to get care and benefits from [place] and other. Otherwise feel sfine.  Gets twitch eye when discussing  the  stress problem and better when not.  Taking flexeril at night helps sleep also  Lexapro very helpful Hsnt heard as well in left ear for a while and sometimes hear heart beat in ear . ? Tinnitus? bilateral Objective:    Today's Vitals   08/08/23 1107  BP: 128/60  Pulse: 69  Temp: 97.8 F (36.6 C)  TempSrc: Oral  SpO2: 96%  Weight: 232 lb 9.6 oz (105.5 kg)  Height: 5' 3.8" (1.621 m)  Body mass index is 40.18 kg/m.  Medications Outpatient Encounter Medications as of 08/08/2023  Medication Sig   acetaminophen (TYLENOL) 500 MG tablet Take 500-1,000 mg by mouth every 6 (six) hours as needed.   cyclobenzaprine (FLEXERIL) 5 MG tablet TAKE 1 TABLET (5 MG TOTAL) BY MOUTH AT BEDTIME. FOR MUSCLE SPASM   eletriptan (RELPAX) 40 MG tablet TAKE 1 TABLET (40 MG TOTAL) BY MOUTH EVERY 2 (TWO) HOURS AS NEEDED FOR MIGRAINE OR HEADACHE.   EPINEPHrine (AUVI-Q) 0.3 mg/0.3 mL IJ SOAJ injection Use as directed for severe allergic reactions   escitalopram (LEXAPRO) 10 MG tablet TAKE 1 TABLET BY MOUTH EVERY DAY   estradiol (ESTRACE) 0.1 MG/GM vaginal cream Place 1 Applicatorful vaginally 3 (three) times a week. PRN. Schedule follow up for further refills. [phone]   Evolocumab (REPATHA SURECLICK) 140 MG/ML SOAJ Inject 140 mg into the skin every 14 (fourteen) days.   fluticasone (FLONASE) 50 MCG/ACT nasal spray Place 2 sprays into both nostrils daily.   ipratropium (ATROVENT) 0.06 % nasal spray Place 2 sprays into both nostrils  4 (four) times daily.   montelukast (SINGULAIR) 10 MG tablet TAKE 1 TABLET BY MOUTH EVERY DAY AT NIGHT FOR COUGHING/WHEEZING   NON FORMULARY 2 injections every week then at 0.50 cc every 2 weeks. Now at 4 wks. Allergy shot.   VITAMIN D PO Take by mouth.   No facility-administered encounter medications on file as of 08/08/2023.     History: Past Medical History:  Diagnosis Date   Abnormal LFTs    Allergy    Anemia    Anxiety    Depression    Fatty liver    on [place] Jan 2011   Headache(784.0)    Hyperlipidemia    Inital 400's   Mood disorder (HCC)    Osteoarthritis    Primiparous    Sleep apnea    uses cpap   Snoring    ENT [facility] Jan 2011 nasal obstruction and cryptic tonsils   Past Surgical History:  Procedure Laterality Date   BREAST SURGERY  1995   bx   COLONOSCOPY W/ BIOPSIES  04/26/2020   with removal of 8 polyps(polypectomy   dental implant Left 11/2016   TIBIA FRACTURE SURGERY     september 2020, no surgery    Family History  Problem Relation Age of Onset   Arthritis Mother    COPD Mother  Dementia Mother    Endometrial cancer Mother    Skin cancer Mother    Osteoporosis Mother    Other Mother        dementia   Heart attack Father    Hypertension Father        cabd Mi 59 died CHF   Heart failure Father    Heart disease Father    Sleep apnea Sister        improved with weight loss   Arthritis Sister    Allergic rhinitis Sister    Coronary artery disease Sister    Vasculitis Sister    Allergic rhinitis Sister    Stomach cancer Maternal Grandfather    Dementia Paternal Grandmother    Parkinsonism Cousin    Angioedema Neg Hx    Asthma Neg Hx    Eczema Neg Hx    Immunodeficiency Neg Hx    Urticaria Neg Hx    Colon cancer Neg Hx    Colon polyps Neg Hx    Esophageal cancer Neg Hx    Social History   Occupational History   Not on file  Tobacco Use   Smoking status: Never   Smokeless tobacco: Never  Vaping Use   Vaping status: Never Used   Substance and Sexual Activity   Alcohol use: Not Currently    Alcohol/week: 0.0 standard drinks of alcohol    Comment: socially   Drug use: No   Sexual activity: Not Currently    Tobacco Counseling Counseling given: Not Answered   Immunizations and Health Maintenance Immunization History  Administered Date(s) Administered   Influenza Inj Mdck Quad Pf 06/11/2021   Influenza Whole 05/22/2008, 05/28/2012   Influenza, High Dose Seasonal PF 07/04/2023   Influenza,inj,Quad PF,6+ Mos 05/13/2019, 05/04/2020, 06/14/2022   Moderna Covid-19 Fall Seasonal Vaccine 47yrs & older 07/04/2023   Moderna Covid-19 Vaccine Bivalent Booster 20yrs & up 06/11/2021   PFIZER Comirnaty(Gray Top)Covid-19 Tri-Sucrose Vaccine 05/28/2020, 07/28/2020, 11/25/2020   PNEUMOCOCCAL CONJUGATE-20 08/08/2023   Td 03/05/2000   Tdap 01/25/2011   Unspecified SARS-COV-2 Vaccination 11/07/2019, 11/28/2019, 06/14/2022   Zoster Recombinant(Shingrix) 10/23/2017, 01/08/2018   Health Maintenance Due  Topic Date Due   DEXA SCAN  Never done    Activities of Daily Living    08/07/2023    5:09 PM  In your present state of health, do you have any difficulty performing the following activities:  Hearing? 1  Vision? 0  Difficulty concentrating or making decisions? 0  Walking or climbing stairs? 1  Comment pt states a little bit with the stairs.  Dressing or bathing? 0  Doing errands, shopping? 0  Preparing Food and eating ? N  Using the Toilet? N  In the past six months, have you accidently leaked urine? N  Do you have problems with loss of bowel control? N  Managing your Medications? N  Managing your Finances? N  Housekeeping or managing your Housekeeping? N    Physical Exam   Physical Exam (optional), or other factors deemed appropriate based on the beneficiary's medical and social history and current clinical standards. Right tm partly visualized 1+ wax  left tm visualzed no acute finding Hearing grossly dec on  left  No bruit and no acute neuro findings  Chest cta  CV no murmur pr bruits heard chest neck  head   Advanced Directives: Does Patient Have a Medical Advance Directive?: Yes Type of Advance Directive: Healthcare Power of Attorney, Living will Copy of Healthcare Power of Attorney in Chart?: No -  copy requested  EKG: done last year  .      Assessment:    This is a routine wellness examination for this patient .   Vision/Hearing screen Hearing Screening   500Hz  1000Hz  2000Hz  4000Hz   Right ear Pass Pass Pass Pass  Left ear Fail Fail Fail Fail   Vision Screening   Right eye Left eye Both eyes  Without correction     With correction 20/20 20/25 20/20      Goals   None    Depression Screen    08/08/2023   11:11 AM 03/21/2023    2:29 PM 01/02/2022    3:09 PM 03/02/2021   11:35 AM  PHQ 2/9 Scores  PHQ - 2 Score 2 6 2  0  PHQ- 9 Score 11 11 9 10     On lexapro   stress see above related to being POA of another  Fall Risk    08/08/2023   11:11 AM  Fall Risk   Falls in the past year? 0  Number falls in past yr: 0  Injury with Fall? 0  Risk for fall due to : No Fall Risks  Follow up Falls evaluation completed    Cognitive Function:        08/08/2023   11:18 AM  6CIT Screen  What Year? 0 points  What month? 0 points  What time? 0 points  Count back from 20 0 points  Months in reverse 0 points  Repeat phrase 0 points  Total Score 0 points  I think above was scored incorectly as was able to answer  nl conversation and ability to do  estate planning for other   Patient Care Team: Gil Ingwersen, Neta Mends, MD as PCP - General Branch, Alben Spittle, MD as PCP - Cardiology (Cardiology) Drema Halon, MD (Inactive) Drema Dallas, DO as Consulting Physician (Neurology)     Plan:   Audiology evalution advised and will refer   I have personally reviewed and noted the following in the patient's chart:   Medical and social history Use of alcohol, tobacco or illicit  drugs  Current medications and supplements Functional ability and status  still scared of stairs after injury lipid clinic Nutritional status Physical activity Advanced directives List of other physicians allergist  Hospitalizations, surgeries, and ER visits in previous 12 months Vitals Screenings to include cognitive, depression, and falls Referrals and appointments  In addition, I have reviewed and discussed with patient certain preventive protocols, quality metrics, and best practice recommendations. A written personalized care plan for preventive services as well as general preventive health recommendations were provided to patient.     Berniece Andreas, MD 08/10/2023

## 2023-08-08 NOTE — Patient Instructions (Addendum)
Good to see you today . Will do referral for audiology Get appt for dexa bone density . Call for colonscopy appt with Gi dept.  Prevnar 20 today . Plan fu with labs in 6-7 months or as needed .   Audrey Jenkins , Thank you for taking time to come for your Medicare Wellness Visit. I appreciate your ongoing commitment to your health goals. Please review the following plan we discussed and let me know if I can assist you in the future.   These are the goals we discussed:  Goals   None     This is a list of the screening recommended for you and due dates:  Health Maintenance  Topic Date Due   DEXA scan (bone density measurement)  Never done   Colon Cancer Screening  09/08/2023*   DTaP/Tdap/Td vaccine (3 - Td or Tdap) 02/06/2024*   Pneumonia Vaccine (1 of 1 - PCV) 02/06/2024*   Pap with HPV screening  08/07/2024*   HIV Screening  08/07/2024*   COVID-19 Vaccine (9 - 2023-24 season) 08/29/2023   Medicare Annual Wellness Visit  08/07/2024   Flu Shot  Completed   Hepatitis C Screening  Completed   Zoster (Shingles) Vaccine  Completed   HPV Vaccine  Aged Out  *Topic was postponed. The date shown is not the original due date.

## 2023-08-09 ENCOUNTER — Encounter: Payer: Self-pay | Admitting: Internal Medicine

## 2023-08-20 ENCOUNTER — Ambulatory Visit (INDEPENDENT_AMBULATORY_CARE_PROVIDER_SITE_OTHER): Payer: Self-pay

## 2023-08-20 DIAGNOSIS — J309 Allergic rhinitis, unspecified: Secondary | ICD-10-CM

## 2023-08-24 ENCOUNTER — Encounter: Payer: Self-pay | Admitting: Internal Medicine

## 2023-08-24 IMAGING — CT CT HEART MORP W/ CTA COR W/ SCORE W/ CA W/CM &/OR W/O CM
4 of 8 series · 8 of 20 positions shown, 9 images · IV contrast (APPLIED)
Comparison: Chest two views 01/02/2022

Addendum:
CLINICAL DATA: Dyspnea and Equivocal ETT

EXAM:
Cardiac CTA
MEDICATIONS:
Sub lingual nitro. 4mg and lopressor 50mg
TECHNIQUE: The patient was scanned on a Siemens Force [REDACTED]ice scanner. Gantry
rotation speed was 250 msecs. Collimation was .6 mm. A 100 kV
prospective scan was triggered in the ascending thoracic aorta at
140 HU's Full mA was used between 35% and 75% of the R-R interval.
Average HR during the scan was 48 bpm. The 3D data set was
interpreted on a dedicated work station using MPR, MIP and VRT
modes. A total of 80 cc of contrast was used.

[Series 7: ts diast sharp · axial · 0.37mm/px · z∈[+1257,+1292]mm · 2 of 266 slices shown]
[im 89/266  lung]
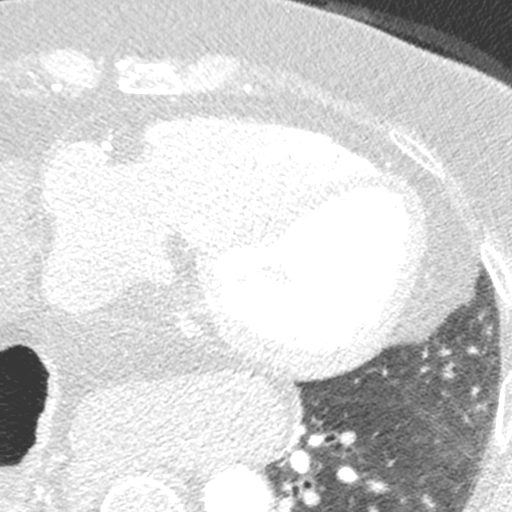
[im 177/266  lung]
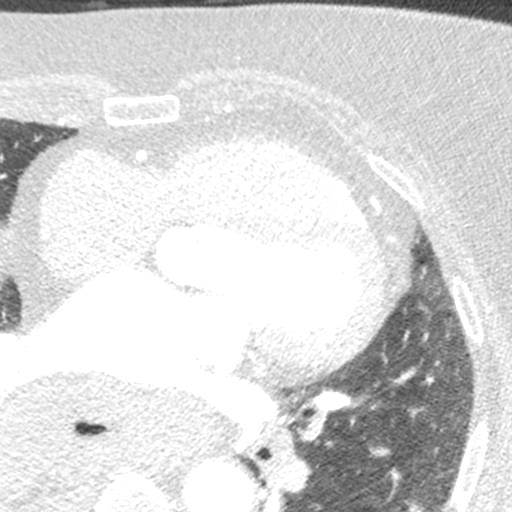

[Series 8: ts syst sharp · axial · 0.37mm/px · z∈[+1257,+1292]mm · 2 of 266 slices shown]
[im 89/266  lung]
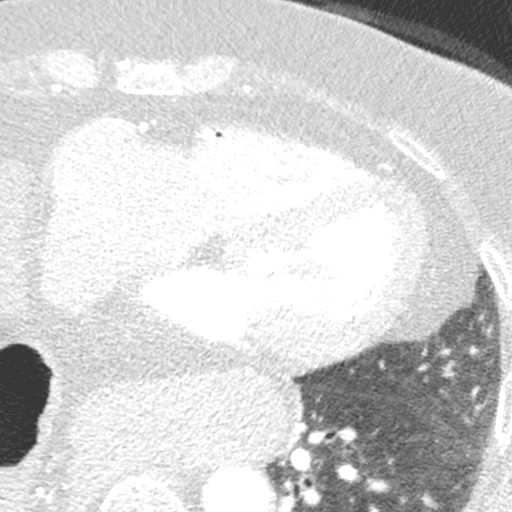
[im 177/266  lung]
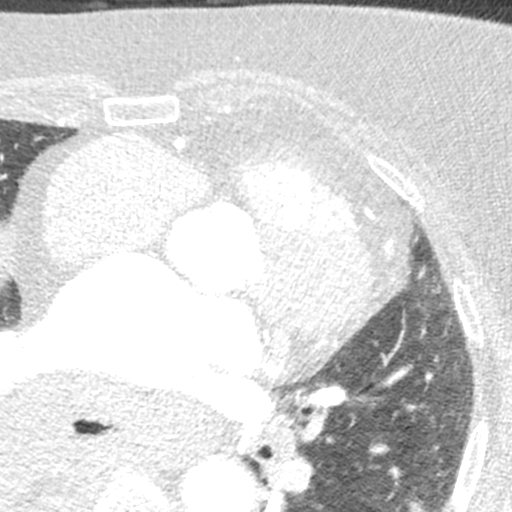

[Series 9: best syst · axial · 0.37mm/px · z∈[+1257,+1292]mm · 2 of 266 slices shown, 3 images]
[im 89/266  vessel]
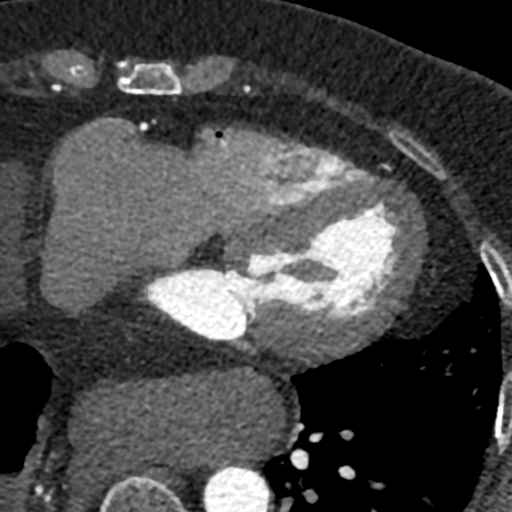
[im 89/266  lung]
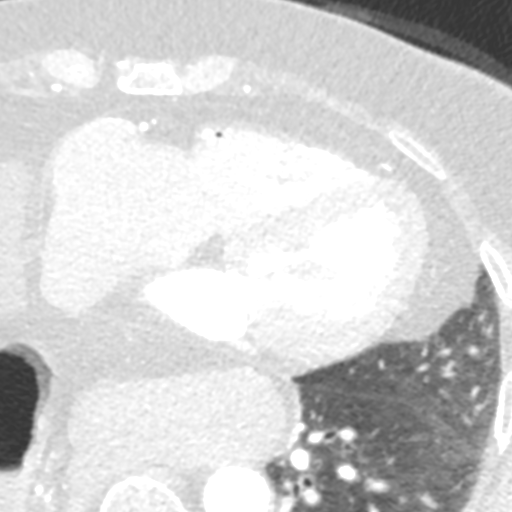
[im 177/266  vessel]
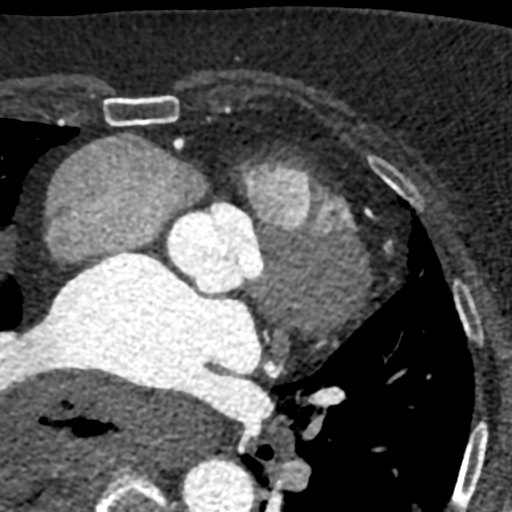

[Series 10: best diast · axial · 0.37mm/px · z∈[+1257,+1292]mm · 2 of 266 slices shown]
[im 89/266  vessel]
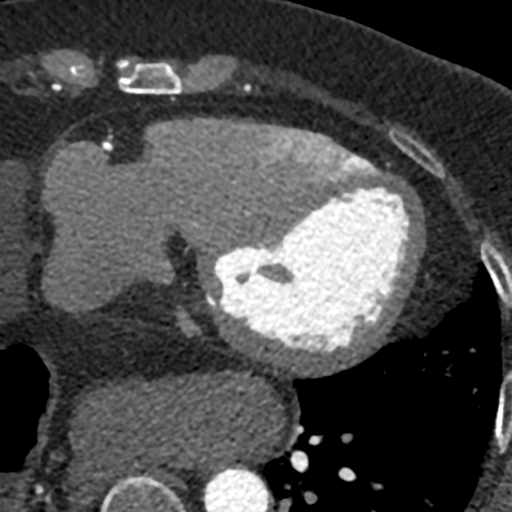
[im 177/266  vessel]
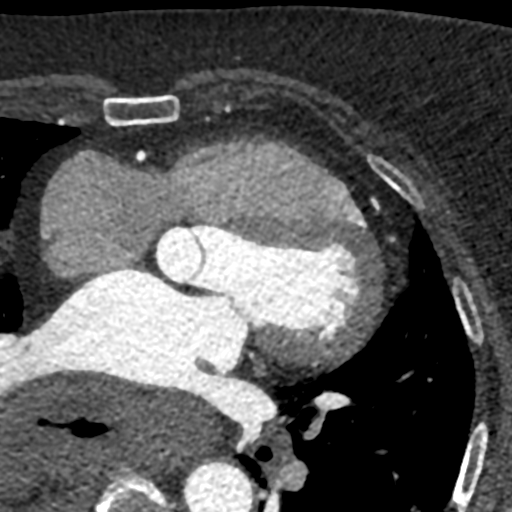

[8 of 20 positions shown; findings below may reference images not displayed]

FINDINGS: Non-cardiac: See separate report from [REDACTED]. No
significant findings on limited lung and soft tissue windows.

Calcium Score: No calcium noted

Coronary Arteries: Right dominant with no anomalies

LM: Normal

LAD:  Normal

D1: Normal Large branching artery

Circumflex: Normal

OM1: Normal

AV Groove: Normal

RCA: Normal

PDA: Normal

PLA: Normal
IMPRESSION: 1. Calcium score 0

2.  Normal ascending thoracic aorta 3.1 cm

3.  Large hiatal hernia with apex heart shifted to left thorax

Sai Kun Ng

EXAM:
OVER-READ INTERPRETATION  CT CHEST

The following report is an over-read performed by radiologist Dr.
Yulie Nieto [REDACTED] on 02/03/2022. This over-read
does not include interpretation of cardiac or coronary anatomy or
pathology. The coronary calcium score/coronary CTA interpretation by
the cardiologist is attached.
FINDINGS: Cardiovascular: There are no significant extracardiac vascular
findings.

Mediastinum/Nodes: There are no enlarged lymph nodes within the
visualized mediastinum.

Lungs/Pleura: There is no pleural effusion. There is ground-glass
density and subsegmental atelectasis within the right lower lobe
around the large hiatal hernia.

Upper abdomen: There is a large, partially visualized,
supradiaphragmatic gastric hernia, apparently a combination of a
sliding and paraesophageal hiatal hernia.

Musculoskeletal/Chest wall: No chest wall mass or suspicious osseous
findings within the visualized chest.
IMPRESSION: Large, partially visualized, supradiaphragmatic gastric hernia.

*** End of Addendum ***
FINDINGS: Non-cardiac: See separate report from [REDACTED]. No
significant findings on limited lung and soft tissue windows.

Calcium Score: No calcium noted

Coronary Arteries: Right dominant with no anomalies

LM: Normal

LAD:  Normal

D1: Normal Large branching artery

Circumflex: Normal

OM1: Normal

AV Groove: Normal

RCA: Normal

PDA: Normal

PLA: Normal
IMPRESSION: 1. Calcium score 0

2.  Normal ascending thoracic aorta 3.1 cm

3.  Large hiatal hernia with apex heart shifted to left thorax

Sai Kun Ng

## 2023-09-03 ENCOUNTER — Inpatient Hospital Stay: Admission: RE | Admit: 2023-09-03 | Payer: Medicare Other | Source: Ambulatory Visit

## 2023-09-07 ENCOUNTER — Other Ambulatory Visit: Payer: Self-pay | Admitting: Internal Medicine

## 2023-09-12 ENCOUNTER — Other Ambulatory Visit: Payer: Self-pay | Admitting: Family

## 2023-09-20 ENCOUNTER — Ambulatory Visit (INDEPENDENT_AMBULATORY_CARE_PROVIDER_SITE_OTHER): Payer: Medicare Other

## 2023-09-20 DIAGNOSIS — J309 Allergic rhinitis, unspecified: Secondary | ICD-10-CM | POA: Diagnosis not present

## 2023-09-25 ENCOUNTER — Ambulatory Visit (INDEPENDENT_AMBULATORY_CARE_PROVIDER_SITE_OTHER)
Admission: RE | Admit: 2023-09-25 | Discharge: 2023-09-25 | Disposition: A | Discharge status: Home / Self Care | Payer: Medicare Other | Source: Ambulatory Visit | Attending: Internal Medicine | Admitting: Internal Medicine

## 2023-09-25 DIAGNOSIS — H9192 Unspecified hearing loss, left ear: Secondary | ICD-10-CM

## 2023-09-25 DIAGNOSIS — E2839 Other primary ovarian failure: Secondary | ICD-10-CM | POA: Diagnosis not present

## 2023-10-10 ENCOUNTER — Other Ambulatory Visit: Payer: Self-pay | Admitting: Internal Medicine

## 2023-10-11 MED ORDER — CYCLOBENZAPRINE HCL 5 MG PO TABS: 5.0000 mg | ORAL_TABLET | Freq: Every day | ORAL | 0 refills | Status: DC

## 2023-10-12 ENCOUNTER — Encounter: Payer: Self-pay | Admitting: Internal Medicine

## 2023-10-12 NOTE — Progress Notes (Signed)
T score -1.8  slightly low bone density  can review at next visit  can repeat in 3-4 years

## 2023-10-19 ENCOUNTER — Ambulatory Visit (INDEPENDENT_AMBULATORY_CARE_PROVIDER_SITE_OTHER): Payer: Medicare Other

## 2023-10-19 DIAGNOSIS — J309 Allergic rhinitis, unspecified: Secondary | ICD-10-CM | POA: Diagnosis not present

## 2023-10-22 ENCOUNTER — Other Ambulatory Visit: Payer: Self-pay | Admitting: *Deleted

## 2023-10-22 ENCOUNTER — Ambulatory Visit: Payer: Medicare Other | Attending: Internal Medicine | Admitting: Audiology

## 2023-10-22 DIAGNOSIS — E7801 Familial hypercholesterolemia: Secondary | ICD-10-CM

## 2023-10-22 DIAGNOSIS — H9313 Tinnitus, bilateral: Secondary | ICD-10-CM | POA: Insufficient documentation

## 2023-10-22 DIAGNOSIS — H903 Sensorineural hearing loss, bilateral: Secondary | ICD-10-CM | POA: Diagnosis present

## 2023-10-22 NOTE — Procedures (Signed)
 Outpatient Audiology and [facility] [address] [place], [place]  [address] [phone]  AUDIOLOGICAL  EVALUATION  NAME: Audrey Jenkins     DOB:   August 11, 1956      MRN: [id number]                                                                                     DATE: 10/22/2023     REFERENT: Madelin Headings, MD STATUS: Outpatient DIAGNOSIS: Sensorineural hearing loss, bilateral  History: Zanae was seen for an audiological evaluation due to decreased hearing occurring for many years.  Lashauna subjectively reports her hearing is worse in the left ear.  She reports a history of tinnitus which she describes as constant and is worse in the left ear.  Nashira reports her tinnitus sounds like a car horn going off.  She reports her tinnitus is worse at night and oftentimes feels like she hears her heartbeat in her left ear.  Kyliah reports an echo in her left ear.  She denies dizziness and otalgia.  Krissia reports increased difficulty hearing and communicating with friends and family, hearing in background noise, and hearing the television.  Evaluation:  Otoscopy showed a clear view of the tympanic membranes, bilaterally Tympanometry results were consistent with normal middle ear pressure and normal tympanic membrane mobility, bilaterally Audiometric testing was completed using Conventional Audiometry techniques with insert earphones and TDH headphones. Test results are consistent in the right ear with normal hearing sensitivity from 250 Hz to 6000 Hz and a moderate hearing loss at 8000 Hz.  Test results are consistent in the left ear with normal hearing sensitivity from 250 to 500 Hz sloping to a moderate sensorineural hearing loss.  There is a sensorineural asymmetry noted at 750 to 6000 Hz, worse in the left ear. Speech Recognition Thresholds were obtained at 20 dB HL in the right ear and at 30 dB HL in the left ear. Word Recognition Testing was completed at 70 dB HL and Elspeth scored 92%  bilaterally  Results:  The test results were reviewed with Vernona Rieger.  Today's test results are consistent with essentially normal hearing sensitivity in the right ear with the exception of a moderate hearing loss at 8000 Hz and are consistent with a mild to moderate sensorineural hearing loss in the left ear. There is a sensorineural asymmetry noted at 750 to 6000 Hz, worse in the left ear. Duru may have hearing and communication difficulty in many listening environments.  She will benefit from the use of a hearing aid in the left ear.  A referral to an ear nose and throat physician was reviewed with Luzelena due to her asymmetric hearing loss and her tinnitus.  The use of a hearing aid for the left ear was reviewed. Tinnitus management strategies were reviewed.   Recommendations: 1.   Referral to an ear nose and throat physician for evaluation of asymmetric hearing loss and tinnitus. 2.   Communication needs assessment with an audiologist to discuss amplification for the left ear if motivated to pursue a hearing aid.   35 minutes spent testing and counseling on results.   If you have any questions please  feel free to contact me at [phone].  Marton Redwood Audiologist, Au.D., CCC-A 10/22/2023  3:02 PM  Cc: Madelin Headings, MD

## 2023-11-16 ENCOUNTER — Ambulatory Visit (INDEPENDENT_AMBULATORY_CARE_PROVIDER_SITE_OTHER): Payer: Self-pay

## 2023-11-16 DIAGNOSIS — J309 Allergic rhinitis, unspecified: Secondary | ICD-10-CM | POA: Diagnosis not present

## 2023-12-09 ENCOUNTER — Other Ambulatory Visit: Payer: Self-pay | Admitting: Internal Medicine

## 2023-12-11 ENCOUNTER — Ambulatory Visit (INDEPENDENT_AMBULATORY_CARE_PROVIDER_SITE_OTHER): Payer: Self-pay

## 2023-12-11 DIAGNOSIS — J309 Allergic rhinitis, unspecified: Secondary | ICD-10-CM

## 2023-12-12 ENCOUNTER — Other Ambulatory Visit: Payer: Self-pay | Admitting: Internal Medicine

## 2023-12-28 LAB — NMR, LIPOPROFILE
Cholesterol, Total: 292 mg/dL — ABNORMAL HIGH (ref 100–199)
HDL Particle Number: 35.7 umol/L (ref >=30.5)
HDL-C: 57 mg/dL (ref >39)
LDL Particle Number: 2088 nmol/L — ABNORMAL HIGH (ref <1000)
LDL Size: 21.3 nm (ref >20.5)
LDL-C (NIH Calc): 199 mg/dL — ABNORMAL HIGH (ref 0–99)
LP-IR Score: 54 — ABNORMAL HIGH (ref <=45)
Small LDL Particle Number: 963 nmol/L — ABNORMAL HIGH (ref <=527)
Triglycerides: 191 mg/dL — ABNORMAL HIGH (ref 0–149)

## 2023-12-28 LAB — LIPOPROTEIN A (LPA): Lipoprotein (a): 113.6 nmol/L — ABNORMAL HIGH (ref <75.0)

## 2024-01-02 ENCOUNTER — Ambulatory Visit: Payer: Medicare Other | Attending: Internal Medicine | Admitting: Internal Medicine

## 2024-01-02 ENCOUNTER — Encounter: Payer: Self-pay | Admitting: Internal Medicine

## 2024-01-02 VITALS — BP 140/80 | HR 70 | Ht 63.75 in | Wt 235.0 lb

## 2024-01-02 DIAGNOSIS — E7801 Familial hypercholesterolemia: Secondary | ICD-10-CM | POA: Insufficient documentation

## 2024-01-02 DIAGNOSIS — T466X5D Adverse effect of antihyperlipidemic and antiarteriosclerotic drugs, subsequent encounter: Secondary | ICD-10-CM | POA: Diagnosis not present

## 2024-01-02 DIAGNOSIS — M791 Myalgia, unspecified site: Secondary | ICD-10-CM | POA: Insufficient documentation

## 2024-01-02 DIAGNOSIS — T466X5A Adverse effect of antihyperlipidemic and antiarteriosclerotic drugs, initial encounter: Secondary | ICD-10-CM | POA: Diagnosis present

## 2024-01-02 DIAGNOSIS — R030 Elevated blood-pressure reading, without diagnosis of hypertension: Secondary | ICD-10-CM | POA: Insufficient documentation

## 2024-01-02 MED ORDER — ROSUVASTATIN CALCIUM 5 MG PO TABS: 5.0000 mg | ORAL_TABLET | Freq: Every day | ORAL | 3 refills | Status: DC

## 2024-01-02 NOTE — Progress Notes (Signed)
 LIPID CLINIC CONSULT NOTE  Chief Complaint:  Follow-up dyslipidemia  Primary Care Physician: Reginal Capra, MD  Primary Cardiologist:  Bridgette Campus, MD  HPI:  Audrey Jenkins is a 68 y.o. female who is being seen today for the evaluation of dyslipidemia at the request of Panosh, Joaquim Muir, MD. 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  She is known about high cholesterol since her 15s.  She started on Mevacor at 32 and was on this for a number of years and recently had tried atorvastatin  and pravastatin with rising cholesterol levels.  Unfortunately she had intolerance to these including myalgias.  Subsequently she had recently been tried on ezetimibe  but had side effects with that as well.  She had been having some chest discomfort and underwent exercise treadmill stress testing which was equivalent.  She then was referred for coronary CT angiography.  Surprisingly this showed no coronary artery disease and no coronary calcification.  It is unclear whether this might be related to long-term statin use.  She has outlived both her father who had an MI in his 61s and her grandfather.  She does not have any children.  She has not previously had genetic testing but is interested in doing the [employer] genetic testing offered by the health system.  We talked today about that testing and some limitations including the fact that it only looks at the 4 major causes of familial hyperlipidemia and that if the testing is inconclusive or negative, would not necessarily exclude the diagnosis of familial hyperlipidemia.  We also discussed possible alternative treatment options today including PCSK9 inhibitors, both the antibody therapies and RNA inhibitors.  Overall she reports a diet trying to watch saturated fat intake.  01/02/2024  Audrey Jenkins returns today for follow-up.  She has been doing well on Repatha .  We were unable to get Leqvio covered.  Her LDL  has come down from 345->299 but still remains significantly elevated.  LDL particle [id number].  Her LP(a) has been fairly stable around 107 nmol/L.  She seems to be tolerating the meds well.  She had had lower cholesterol in the past and noted that the recent increase she thought was related to stopping the statins.  She had chronic pain which she said improved somewhat off of statins.  That being said she would be willing to try them again perhaps at lower doses.  We talked about low-dose rosuvastatin as an option.  Previously she has tried pravastatin, atorvastatin  and Mevacor.  PMHx:  Past Medical History:  Diagnosis Date   Abnormal LFTs    Allergy    Anemia    Anxiety    Depression    Fatty liver    on US  Jan 2011   Headache(784.0)    Hyperlipidemia    Inital 400's   Mood disorder (HCC)    Osteoarthritis    Primiparous    Sleep apnea    uses cpap   Snoring    ENT [facility] Jan 2011 nasal obstruction and cryptic tonsils    Past Surgical History:  Procedure Laterality Date   BREAST SURGERY  1995   bx   COLONOSCOPY W/ BIOPSIES  04/26/2020   with removal of 8 polyps(polypectomy   dental implant Left 11/2016   TIBIA FRACTURE SURGERY     september 2020, no surgery    FAMHx:  Family History  Problem Relation Age of Onset   Arthritis Mother    COPD  Mother    Dementia Mother    Endometrial cancer Mother    Skin cancer Mother    Osteoporosis Mother    Other Mother        dementia   Heart attack Father    Hypertension Father        cabd Mi 1 died CHF   Heart failure Father    Heart disease Father    Sleep apnea Sister        improved with weight loss   Arthritis Sister    Allergic rhinitis Sister    Coronary artery disease Sister    Vasculitis Sister    Allergic rhinitis Sister    Stomach cancer Maternal Grandfather    Dementia Paternal Grandmother    Parkinsonism Cousin    Angioedema Neg Hx    Asthma Neg Hx    Eczema Neg Hx    Immunodeficiency Neg Hx    Urticaria  Neg Hx    Colon cancer Neg Hx    Colon polyps Neg Hx    Esophageal cancer Neg Hx     SOCHx:   reports that she has never smoked. She has never used smokeless tobacco. She reports that she does not currently use alcohol. She reports that she does not use drugs.  ALLERGIES:  Allergies  Allergen Reactions   Atorvastatin  Other (See Comments)    Muscle aches, soreness in legs   Cetirizine Hcl     REACTION: flu symptoms   Codeine    Pravastatin Sodium     REACTION: Diarrhea   Propoxyphene N-Acetaminophen     REACTION: extreme nausea   Rhinocort [Budesonide]     Nose sores   Zetia  [Ezetimibe ]     Body aches, and "sick feeling"    ROS: Pertinent items noted in HPI and remainder of comprehensive ROS otherwise negative.  HOME MEDS: Current Outpatient Medications on File Prior to Visit  Medication Sig Dispense Refill   acetaminophen (TYLENOL) 500 MG tablet Take 500-1,000 mg by mouth every 6 (six) hours as needed.     cyclobenzaprine  (FLEXERIL ) 5 MG tablet TAKE 1 TABLET (5 MG TOTAL) BY MOUTH AT BEDTIME. FOR MUSCLE SPASM 30 tablet 0   eletriptan  (RELPAX ) 40 MG tablet TAKE 1 TABLET (40 MG TOTAL) BY MOUTH EVERY 2 (TWO) HOURS AS NEEDED FOR MIGRAINE OR HEADACHE. 9 tablet 1   EPINEPHrine  (AUVI-Q ) 0.3 mg/0.3 mL IJ SOAJ injection Use as directed for severe allergic reactions 1 each 1   escitalopram  (LEXAPRO ) 10 MG tablet TAKE 1 TABLET BY MOUTH EVERY DAY 90 tablet 0   estradiol  (ESTRACE ) 0.1 MG/GM vaginal cream Place 1 Applicatorful vaginally 3 (three) times a week. PRN. Schedule follow up for further refills. [phone] 42.5 g 0   Evolocumab  (REPATHA  SURECLICK) 140 MG/ML SOAJ Inject 140 mg into the skin every 14 (fourteen) days. 6 mL 3   fluticasone  (FLONASE ) 50 MCG/ACT nasal spray Place 2 sprays into both nostrils daily. 48 mL 3   ipratropium (ATROVENT ) 0.06 % nasal spray Place 2 sprays into both nostrils 4 (four) times daily. 15 mL 12   montelukast  (SINGULAIR ) 10 MG tablet TAKE 1 TABLET BY  MOUTH EVERY DAY AT NIGHT FOR COUGHING/WHEEZING 90 tablet 1   NON FORMULARY 2 injections every week then at 0.50 cc every 2 weeks. Now at 4 wks. Allergy shot.     VITAMIN D  PO Take by mouth.     No current facility-administered medications on file prior to visit.    LABS/IMAGING: No results  found for this or any previous visit (from the past 48 hours). No results found.  LIPID PANEL:    Component Value Date/Time   CHOL 438 (H) 03/22/2023 1300   TRIG 187.0 (H) 03/22/2023 1300   HDL 55.80 03/22/2023 1300   CHOLHDL 8 03/22/2023 1300   VLDL 37.4 03/22/2023 1300   LDLCALC 345 (H) 03/22/2023 1300   LDLDIRECT 157.4 02/19/2014 0940    WEIGHTS: Wt Readings from Last 3 Encounters:  01/02/24 235 lb (106.6 kg)  08/08/23 232 lb 9.6 oz (105.5 kg)  07/20/23 234 lb (106.1 kg)    VITALS: BP (!) 140/80   Pulse 70   Ht 5' 3.75" (1.619 m)   Wt 235 lb (106.6 kg)   SpO2 98%   BMI 40.65 kg/m   EXAM: Deferred  EKG: Deferred  ASSESSMENT: Definite familial hyperlipidemia based on Simon Broome criteria, LDL greater than 300 untreated Family history of high cholesterol and early onset heart disease in her father and grandfather Elevated LP(a) of 107 nmol/L 0 coronary calcium  and no detectable coronary artery disease by cardiac CT (01/2022)  PLAN: 1.   Audrey Jenkins has had significant improvement on Repatha  but has persistently elevated cholesterol.  She is agreeable to trying low-dose rosuvastatin.  She knows what symptoms to watch out for.  Will plan repeat lipids in about 3 to 4 months.  Also her blood pressure was elevated today.  I did recheck it and it came down to 140/80 but she is not on any medications for this.  She is under a lot of stress.  We talked about increasing exercise and decreasing sodium intake as well as weight loss which may help.  She has follow-up with her PCP in July.  I advised her to keep a log of her blood pressures for that appointment.  Hazle Lites, MD, [facility],  FNLA, FACP  [place]  [facility]  Medical Director of the Advanced Lipid Disorders &  Cardiovascular Risk Reduction Clinic Diplomate of the American Board of Clinical Lipidology Attending Cardiologist  Direct Dial: [phone]  Fax: [phone]  Website:  [email or web address]  Aviva Lemmings Shanel Prazak 01/02/2024, 10:20 AM

## 2024-01-02 NOTE — Patient Instructions (Signed)
 Medication Instructions:  START rosuvastatin 5mg  once daily for cholesterol  *If you need a refill on your cardiac medications before your next appointment, please call your pharmacy*  Lab Work: FASTING lab work in about 3 months -- can also complete with PCP at July appointment  If you have labs (blood work) drawn today and your tests are completely normal, you will receive your results only by: MyChart Message (if you have MyChart) OR A paper copy in the mail If you have any lab test that is abnormal or we need to change your treatment, we will call you to review the results.  Follow-Up: At [facility], you and your health needs are our priority.  As part of our continuing mission to provide you with exceptional heart care, our providers are all part of one team.  This team includes your primary Cardiologist (physician) and Advanced Practice Providers or APPs (Physician Assistants and Nurse Practitioners) who all work together to provide you with the care you need, when you need it.  Your next appointment:    12 months Dr. Maximo Spar or Slater Duncan NP -- lipid clinic  We recommend signing up for the patient portal called "MyChart".  Sign up information is provided on this After Visit Summary.  MyChart is used to connect with patients for Virtual Visits (Telemedicine).  Patients are able to view lab/test results, encounter notes, upcoming appointments, etc.  Non-urgent messages can be sent to your provider as well.   To learn more about what you can do with MyChart, go to [email or web address].   Other Instructions Check BP readings at home at least ONCE DAILY. Send BP readings in about 2 weeks via MyChart If you monitor your blood pressure (BP) at home, please bring your BP cuff and your BP readings with you to this appointment  HOW TO TAKE YOUR BLOOD PRESSURE: Rest 5 minutes before taking your blood pressure. Don't smoke or drink caffeinated beverages for at least 30  minutes before. Take your blood pressure before (not after) you eat. Sit comfortably with your back supported and both feet on the floor (don't cross your legs). Elevate your arm to heart level on a table or a desk. Use the proper sized cuff. It should fit smoothly and snugly around your bare upper arm. There should be enough room to slip a fingertip under the cuff. The bottom edge of the cuff should be 1 inch above the crease of the elbow. Ideally, take 3 measurements at one sitting and record the average.

## 2024-01-10 ENCOUNTER — Ambulatory Visit (INDEPENDENT_AMBULATORY_CARE_PROVIDER_SITE_OTHER)

## 2024-01-10 DIAGNOSIS — J309 Allergic rhinitis, unspecified: Secondary | ICD-10-CM | POA: Diagnosis not present

## 2024-01-13 ENCOUNTER — Other Ambulatory Visit: Payer: Self-pay | Admitting: Internal Medicine

## 2024-01-14 DIAGNOSIS — J3081 Allergic rhinitis due to animal (cat) (dog) hair and dander: Secondary | ICD-10-CM | POA: Diagnosis not present

## 2024-01-14 NOTE — Progress Notes (Signed)
 VIALS MADE 01-14-24

## 2024-01-15 DIAGNOSIS — J3089 Other allergic rhinitis: Secondary | ICD-10-CM | POA: Diagnosis not present

## 2024-02-15 ENCOUNTER — Ambulatory Visit (INDEPENDENT_AMBULATORY_CARE_PROVIDER_SITE_OTHER): Payer: Self-pay | Admitting: *Deleted

## 2024-02-15 DIAGNOSIS — J309 Allergic rhinitis, unspecified: Secondary | ICD-10-CM | POA: Diagnosis not present

## 2024-02-16 ENCOUNTER — Other Ambulatory Visit: Payer: Self-pay | Admitting: Internal Medicine

## 2024-02-26 ENCOUNTER — Ambulatory Visit (INDEPENDENT_AMBULATORY_CARE_PROVIDER_SITE_OTHER): Payer: Self-pay

## 2024-02-26 DIAGNOSIS — J309 Allergic rhinitis, unspecified: Secondary | ICD-10-CM | POA: Diagnosis not present

## 2024-02-28 ENCOUNTER — Ambulatory Visit: Payer: Self-pay | Admitting: Internal Medicine

## 2024-02-28 LAB — NMR, LIPOPROFILE
Cholesterol, Total: 139 mg/dL (ref 100–199)
HDL Particle Number: 42.8 umol/L (ref >=30.5)
HDL-C: 64 mg/dL (ref >39)
LDL Particle Number: 758 nmol/L (ref <1000)
LDL Size: 20.6 nm (ref >20.5)
LDL-C (NIH Calc): 55 mg/dL (ref 0–99)
LP-IR Score: 46 — ABNORMAL HIGH (ref <=45)
Small LDL Particle Number: 501 nmol/L (ref <=527)
Triglycerides: 110 mg/dL (ref 0–149)

## 2024-03-04 ENCOUNTER — Ambulatory Visit (INDEPENDENT_AMBULATORY_CARE_PROVIDER_SITE_OTHER): Payer: Medicare Other | Admitting: Internal Medicine

## 2024-03-04 ENCOUNTER — Encounter: Payer: Self-pay | Admitting: Internal Medicine

## 2024-03-04 VITALS — BP 100/76 | HR 66 | Temp 98.1°F | Ht 63.75 in | Wt 224.2 lb

## 2024-03-04 DIAGNOSIS — E785 Hyperlipidemia, unspecified: Secondary | ICD-10-CM

## 2024-03-04 DIAGNOSIS — Z79899 Other long term (current) drug therapy: Secondary | ICD-10-CM | POA: Diagnosis not present

## 2024-03-04 DIAGNOSIS — E559 Vitamin D deficiency, unspecified: Secondary | ICD-10-CM | POA: Diagnosis not present

## 2024-03-04 DIAGNOSIS — E7801 Familial hypercholesterolemia: Secondary | ICD-10-CM | POA: Diagnosis not present

## 2024-03-04 LAB — CBC WITH DIFFERENTIAL/PLATELET
Basophils Absolute: 0 K/uL (ref 0.0–0.1)
Basophils Relative: 0.3 % (ref 0.0–3.0)
Eosinophils Absolute: 0.2 K/uL (ref 0.0–0.7)
Eosinophils Relative: 2.1 % (ref 0.0–5.0)
HCT: 37.7 % (ref 36.0–46.0)
Hemoglobin: 12.3 g/dL (ref 12.0–15.0)
Lymphocytes Relative: 23.5 % (ref 12.0–46.0)
Lymphs Abs: 1.9 K/uL (ref 0.7–4.0)
MCHC: 32.8 g/dL (ref 30.0–36.0)
MCV: 84.3 fl (ref 78.0–100.0)
Monocytes Absolute: 0.6 K/uL (ref 0.1–1.0)
Monocytes Relative: 7 % (ref 3.0–12.0)
Neutro Abs: 5.5 K/uL (ref 1.4–7.7)
Neutrophils Relative %: 67.1 % (ref 43.0–77.0)
Platelets: 299 K/uL (ref 150.0–400.0)
RBC: 4.47 Mil/uL (ref 3.87–5.11)
RDW: 14.1 % (ref 11.5–15.5)
WBC: 8.2 K/uL (ref 4.0–10.5)

## 2024-03-04 LAB — BASIC METABOLIC PANEL WITH GFR
BUN: 11 mg/dL (ref 6–23)
CO2: 27 meq/L (ref 19–32)
Calcium: 9.6 mg/dL (ref 8.4–10.5)
Chloride: 103 meq/L (ref 96–112)
Creatinine, Ser: 0.82 mg/dL (ref 0.40–1.20)
GFR: 74.51 mL/min (ref >60.00)
Glucose, Bld: 89 mg/dL (ref 70–99)
Potassium: 4.1 meq/L (ref 3.5–5.1)
Sodium: 139 meq/L (ref 135–145)

## 2024-03-04 LAB — LIPID PANEL
Cholesterol: 147 mg/dL (ref 0–200)
HDL: 64.5 mg/dL (ref >39.00)
LDL Cholesterol: 53 mg/dL (ref 0–99)
NonHDL: 82.84
Total CHOL/HDL Ratio: 2
Triglycerides: 147 mg/dL (ref 0.0–149.0)
VLDL: 29.4 mg/dL (ref 0.0–40.0)

## 2024-03-04 LAB — VITAMIN D 25 HYDROXY (VIT D DEFICIENCY, FRACTURES): VITD: 57.61 ng/mL (ref 30.00–100.00)

## 2024-03-04 LAB — HEPATIC FUNCTION PANEL
ALT: 15 U/L (ref 0–35)
AST: 20 U/L (ref 0–37)
Albumin: 4.5 g/dL (ref 3.5–5.2)
Alkaline Phosphatase: 109 U/L (ref 39–117)
Bilirubin, Direct: 0.1 mg/dL (ref 0.0–0.3)
Total Bilirubin: 0.6 mg/dL (ref 0.2–1.2)
Total Protein: 8.1 g/dL (ref 6.0–8.3)

## 2024-03-04 LAB — MICROALBUMIN / CREATININE URINE RATIO
Creatinine,U: 40.7 mg/dL
Microalb Creat Ratio: UNDETERMINED mg/g (ref 0.0–30.0)
Microalb, Ur: <0.7 mg/dL

## 2024-03-04 LAB — TSH: TSH: 1.64 u[IU]/mL (ref 0.35–5.50)

## 2024-03-04 LAB — HEMOGLOBIN A1C: Hgb A1c MFr Bld: 5.6 % (ref 4.6–6.5)

## 2024-03-04 MED ORDER — TIZANIDINE HCL 4 MG PO TABS: 4.0000 mg | ORAL_TABLET | Freq: Every day | ORAL | 0 refills | Status: DC

## 2024-03-04 MED ORDER — ESCITALOPRAM OXALATE 10 MG PO TABS: 10.0000 mg | ORAL_TABLET | Freq: Every day | ORAL | 1 refills | Status: DC

## 2024-03-04 NOTE — Progress Notes (Signed)
 Chief Complaint  Patient presents with   Medical Management of Chronic Issues    Pt expressed she is excited that crestor  put her in normal range.     HPI: Audrey Jenkins 68 y.o. come in for Chronic disease management   Has been seen in lipid clinic and now on repatha  and   crestro   ldl down on lipoprofile  No know se  Following a1c sugars  Taking flexeril  if needed at night   most 4 per week.  No fall no se reported  Still on lexapro  feeling helpful  ROS: See pertinent positives and negatives per HPI. 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    No falling Past Medical History:  Diagnosis Date   Abnormal LFTs    Allergy    Anemia    Anxiety    Depression    Fatty liver    on US  Jan 2011   Headache(784.0)    Hyperlipidemia    Inital 400's   Mood disorder (HCC)    Osteoarthritis    Primiparous    Sleep apnea    uses cpap   Snoring    ENT Ethyl Jan 2011 nasal obstruction and cryptic tonsils    Family History  Problem Relation Age of Onset   Arthritis Mother    COPD Mother    Dementia Mother    Endometrial cancer Mother    Skin cancer Mother    Osteoporosis Mother    Other Mother        dementia   Heart attack Father    Hypertension Father        cabd Mi 48 died CHF   Heart failure Father    Heart disease Father    Sleep apnea Sister        improved with weight loss   Arthritis Sister    Allergic rhinitis Sister    Coronary artery disease Sister    Vasculitis Sister    Allergic rhinitis Sister    Stomach cancer Maternal Grandfather    Dementia Paternal Grandmother    Parkinsonism Cousin    Angioedema Neg Hx    Asthma Neg Hx    Eczema Neg Hx    Immunodeficiency Neg Hx    Urticaria Neg Hx    Colon cancer Neg Hx    Colon polyps Neg Hx    Esophageal cancer Neg Hx     Social History   Socioeconomic History   Marital status: Married    Spouse name: Not on file   Number of children: Not on file   Years of education: Not on file   Highest education level:  Master's degree (e.g., MA, MS, MEng, MEd, MSW, MBA)  Occupational History   Not on file  Tobacco Use   Smoking status: Never   Smokeless tobacco: Never  Vaping Use   Vaping status: Never Used  Substance and Sexual Activity   Alcohol use: Not Currently    Alcohol/week: 0.0 standard drinks of alcohol    Comment: socially   Drug use: No   Sexual activity: Not Currently  Other Topics Concern   Not on file  Social History Narrative   Married   HH of 2   Pet Kittens   Works with Architectural technologist    employed [employer]  Teaches  Full time    6 cats  Rescue some older    Neg tobacco ets rare etoh         Social Drivers  of Health   Financial Resource Strain: Low Risk  (08/08/2023)   Overall Financial Resource Strain (CARDIA)    Difficulty of Paying Living Expenses: Not hard at all  Food Insecurity: No Food Insecurity (03/20/2023)   Hunger Vital Sign    Worried About Running Out of Food in the Last Year: Never true    Ran Out of Food in the Last Year: Never true  Transportation Needs: No Transportation Needs (03/20/2023)   PRAPARE - Administrator, Civil Service (Medical): No    Lack of Transportation (Non-Medical): No  Physical Activity: Insufficiently Active (08/07/2023)   Exercise Vital Sign    Days of Exercise per Week: 1 day    Minutes of Exercise per Session: 30 min  Stress: Stress Concern Present (08/07/2023)   [employer] of Occupational Health - Occupational Stress Questionnaire    Feeling of Stress : Very much  Social Connections: Moderately Integrated (08/08/2023)   Social Connection and Isolation Panel    Frequency of Communication with Friends and Family: More than three times a week    Frequency of Social Gatherings with Friends and Family: Never    Attends Religious Services: Never    Database administrator or Organizations: Yes    Attends Engineer, structural: More than 4 times per year    Marital Status: Married     Outpatient Medications Prior to Visit  Medication Sig Dispense Refill   acetaminophen (TYLENOL) 500 MG tablet Take 500-1,000 mg by mouth every 6 (six) hours as needed.     eletriptan  (RELPAX ) 40 MG tablet TAKE 1 TABLET (40 MG TOTAL) BY MOUTH EVERY 2 (TWO) HOURS AS NEEDED FOR MIGRAINE OR HEADACHE. 9 tablet 1   EPINEPHrine  (AUVI-Q ) 0.3 mg/0.3 mL IJ SOAJ injection Use as directed for severe allergic reactions 1 each 1   estradiol  (ESTRACE ) 0.1 MG/GM vaginal cream Place 1 Applicatorful vaginally 3 (three) times a week. PRN. Schedule follow up for further refills. [phone] 42.5 g 0   Evolocumab  (REPATHA  SURECLICK) 140 MG/ML SOAJ Inject 140 mg into the skin every 14 (fourteen) days. 6 mL 3   fluticasone  (FLONASE ) 50 MCG/ACT nasal spray Place 2 sprays into both nostrils daily. 48 mL 3   ipratropium (ATROVENT ) 0.06 % nasal spray Place 2 sprays into both nostrils 4 (four) times daily. 15 mL 12   montelukast  (SINGULAIR ) 10 MG tablet TAKE 1 TABLET BY MOUTH EVERY DAY AT NIGHT FOR COUGHING/WHEEZING 90 tablet 1   NON FORMULARY 2 injections every week then at 0.50 cc every 2 weeks. Now at 4 wks. Allergy shot.     rosuvastatin  (CRESTOR ) 5 MG tablet Take 1 tablet (5 mg total) by mouth daily. 90 tablet 3   VITAMIN D  PO Take by mouth.     cyclobenzaprine  (FLEXERIL ) 5 MG tablet TAKE 1 TABLET (5 MG TOTAL) BY MOUTH AT BEDTIME. FOR MUSCLE SPASM 30 tablet 0   escitalopram  (LEXAPRO ) 10 MG tablet TAKE 1 TABLET BY MOUTH EVERY DAY 90 tablet 0   No facility-administered medications prior to visit.     EXAM:  BP 100/76 (BP Location: Left Arm, Patient Position: Sitting, Cuff Size: Large)   Pulse 66   Temp 98.1 F (36.7 C) (Oral)   Ht 5' 3.75 (1.619 m)   Wt 224 lb 3.2 oz (101.7 kg)   SpO2 96%   BMI 38.79 kg/m   Body mass index is 38.79 kg/m. Wt Readings from Last 3 Encounters:  03/04/24 224 lb 3.2 oz (101.7  kg)  01/02/24 235 lb (106.6 kg)  08/08/23 232 lb 9.6 oz (105.5 kg)    GENERAL: vitals  reviewed and listed above, alert, oriented, appears well hydrated and in no acute distress HEENT: atraumatic, conjunctiva  clear, no obvious abnormalities on inspection of external nose and ears  NECK: no obvious masses on inspection palpation  LUNGS: clear to auscultation bilaterally, no wheezes, rales or rhonchi, good air movement CV: HRRR, no clubbing cyanosis or  peripheral edema nl cap refill  MS: moves all extremities without noticeable focal  abnormality min assymetrical swelling trc +  PSYCH: pleasant and cooperative, no obvious depression or anxiety Lab Results  Component Value Date   WBC 6.7 03/22/2023   HGB 12.5 03/22/2023   HCT 39.2 03/22/2023   PLT 296.0 03/22/2023   GLUCOSE 88 03/22/2023   CHOL 438 (H) 03/22/2023   TRIG 187.0 (H) 03/22/2023   HDL 55.80 03/22/2023   LDLDIRECT 157.4 02/19/2014   LDLCALC 345 (H) 03/22/2023   ALT 31 03/22/2023   AST 25 03/22/2023   NA 132 (L) 03/22/2023   K 3.8 03/22/2023   CL 98 03/22/2023   CREATININE 0.70 03/22/2023   BUN 11 03/22/2023   CO2 27 03/22/2023   TSH 2.05 03/22/2023   HGBA1C 5.6 03/22/2023   BP Readings from Last 3 Encounters:  03/04/24 100/76  01/02/24 (!) 140/80  08/08/23 128/60   Last vitamin D  Lab Results  Component Value Date   VD25OH 25.15 (L) 03/03/2021    ASSESSMENT AND PLAN:  Discussed the following assessment and plan:  Medication management - Plan: Basic metabolic panel with GFR, CBC with Differential/Platelet, Hemoglobin A1c, Hepatic function panel, Lipid panel, TSH, Microalbumin / creatinine urine ratio, Vitamin D , 25-hydroxy  Hyperlipidemia, unspecified hyperlipidemia type - Plan: Basic metabolic panel with GFR, CBC with Differential/Platelet, Hemoglobin A1c, Hepatic function panel, Lipid panel, TSH, Microalbumin / creatinine urine ratio, Vitamin D , 25-hydroxy  Familial hypercholesterolemia - fathers side and hx of premature vascular events - Plan: Basic metabolic panel with GFR, CBC with  Differential/Platelet, Hemoglobin A1c, Hepatic function panel, Lipid panel, TSH, Microalbumin / creatinine urine ratio, Vitamin D , 25-hydroxy  Vitamin D  deficiency - Plan: Basic metabolic panel with GFR, CBC with Differential/Platelet, Hemoglobin A1c, Hepatic function panel, Lipid panel, TSH, Microalbumin / creatinine urine ratio, Vitamin D , 25-hydroxy Feeling better more hopeful She is so pleased about the lipid  lowering effect of crestor  . Trial of changing night as needed muscle relaxant to tizanidine  4  instead of flexeril . Let us  know if a problem  Plan full labs(  is fasting x diet drink  even t hough early afternoon )  and if all ok  6 months visit Continue  healthy weight loss as possible   -Patient advised to return or notify health care team  if  new concerns arise.  Patient Instructions  Good to see you again . Trying a different muscle relaxant at night .  Update labs today .  If all ok then  6 months check .   Calianna Kim K. Laruen Risser M.D.

## 2024-03-04 NOTE — Patient Instructions (Addendum)
 Good to see you again . Trying a different muscle relaxant at night .  Update labs today .  If all ok then  6 months check .

## 2024-03-07 ENCOUNTER — Other Ambulatory Visit: Payer: Self-pay | Admitting: Internal Medicine

## 2024-03-07 ENCOUNTER — Ambulatory Visit (INDEPENDENT_AMBULATORY_CARE_PROVIDER_SITE_OTHER)

## 2024-03-07 DIAGNOSIS — J309 Allergic rhinitis, unspecified: Secondary | ICD-10-CM

## 2024-03-08 ENCOUNTER — Ambulatory Visit: Payer: Self-pay | Admitting: Internal Medicine

## 2024-03-08 NOTE — Progress Notes (Signed)
 Results are all in range and favorable!   Continue  Sharing with Dr Mona as rick

## 2024-03-22 ENCOUNTER — Other Ambulatory Visit: Payer: Self-pay | Admitting: Internal Medicine

## 2024-03-28 ENCOUNTER — Other Ambulatory Visit: Payer: Self-pay | Admitting: Internal Medicine

## 2024-04-04 ENCOUNTER — Ambulatory Visit (INDEPENDENT_AMBULATORY_CARE_PROVIDER_SITE_OTHER): Payer: Self-pay

## 2024-04-04 DIAGNOSIS — J309 Allergic rhinitis, unspecified: Secondary | ICD-10-CM | POA: Diagnosis not present

## 2024-05-05 ENCOUNTER — Ambulatory Visit (INDEPENDENT_AMBULATORY_CARE_PROVIDER_SITE_OTHER): Payer: Self-pay

## 2024-05-05 DIAGNOSIS — J309 Allergic rhinitis, unspecified: Secondary | ICD-10-CM | POA: Diagnosis not present

## 2024-06-04 ENCOUNTER — Telehealth: Payer: Self-pay

## 2024-06-04 DIAGNOSIS — H9192 Unspecified hearing loss, left ear: Secondary | ICD-10-CM

## 2024-06-04 NOTE — Telephone Encounter (Signed)
 Copied from CRM #[id number]. Topic: Referral - Request for Referral >> Jun 04, 2024  2:49 PM Audrey Jenkins wrote: Did the patient discuss referral with their provider in the last year? Yes (If No - schedule appointment) (If Yes - send message)  Appointment offered? Yes  Type of order/referral and detailed reason for visit: ENT, patient was seen in February by Audiology and recommended she see's an ENT prior to getting hearing aids.    Preference of office, provider, location: Anywhere Dr.Panosh recommends  If referral order, have you been seen by this specialty before? No (If Yes, this issue or another issue? When? Where?  Can we respond through MyChart? Yes

## 2024-06-07 NOTE — Telephone Encounter (Signed)
 Ok to do referral ent

## 2024-06-12 NOTE — Addendum Note (Signed)
 Addended by: Gibril Mastro on: 06/12/2024 05:28 PM   Modules accepted: Orders

## 2024-06-19 ENCOUNTER — Ambulatory Visit (INDEPENDENT_AMBULATORY_CARE_PROVIDER_SITE_OTHER): Payer: Self-pay

## 2024-06-19 DIAGNOSIS — J309 Allergic rhinitis, unspecified: Secondary | ICD-10-CM

## 2024-06-23 ENCOUNTER — Encounter: Payer: Self-pay | Admitting: Gastroenterology

## 2024-06-25 ENCOUNTER — Other Ambulatory Visit: Payer: Self-pay | Admitting: Internal Medicine

## 2024-06-26 ENCOUNTER — Other Ambulatory Visit: Payer: Self-pay | Admitting: Medical Genetics

## 2024-06-26 ENCOUNTER — Ambulatory Visit (INDEPENDENT_AMBULATORY_CARE_PROVIDER_SITE_OTHER): Admitting: Physician Assistant

## 2024-06-26 ENCOUNTER — Encounter (INDEPENDENT_AMBULATORY_CARE_PROVIDER_SITE_OTHER): Payer: Self-pay | Admitting: Physician Assistant

## 2024-06-26 VITALS — BP 135/78 | HR 58 | Temp 98.1°F

## 2024-06-26 DIAGNOSIS — H93A2 Pulsatile tinnitus, left ear: Secondary | ICD-10-CM | POA: Diagnosis not present

## 2024-06-26 DIAGNOSIS — H9311 Tinnitus, right ear: Secondary | ICD-10-CM

## 2024-06-26 DIAGNOSIS — H9042 Sensorineural hearing loss, unilateral, left ear, with unrestricted hearing on the contralateral side: Secondary | ICD-10-CM

## 2024-06-26 DIAGNOSIS — H93A9 Pulsatile tinnitus, unspecified ear: Secondary | ICD-10-CM

## 2024-06-26 DIAGNOSIS — Z006 Encounter for examination for normal comparison and control in clinical research program: Secondary | ICD-10-CM
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
## 2024-06-26 NOTE — Patient Instructions (Signed)
 I have ordered an imaging study for you to complete prior to your next visit. Please call [facility] Scheduling at [phone] to schedule your imaging if you have not received a call within 24 hours. If you are unable to complete your imaging study prior to your next scheduled visit please call our office to let us  know.

## 2024-06-26 NOTE — Progress Notes (Signed)
 Patient has had a lot of caffeine.

## 2024-07-11 ENCOUNTER — Ambulatory Visit (HOSPITAL_COMMUNITY)
Admission: RE | Admit: 2024-07-11 | Discharge: 2024-07-11 | Disposition: A | Discharge status: Home / Self Care | Source: Ambulatory Visit | Attending: Physician Assistant | Admitting: Physician Assistant

## 2024-07-11 DIAGNOSIS — H93A9 Pulsatile tinnitus, unspecified ear: Secondary | ICD-10-CM | POA: Diagnosis present

## 2024-07-11 DIAGNOSIS — H9042 Sensorineural hearing loss, unilateral, left ear, with unrestricted hearing on the contralateral side: Secondary | ICD-10-CM | POA: Diagnosis present

## 2024-07-11 MED ORDER — GADOBUTROL 1 MMOL/ML IV SOLN
10.0000 mL | Freq: Once | INTRAVENOUS | Status: AC | PRN
  Administered 2024-07-11: 10 mL via INTRAVENOUS

## 2024-07-17 ENCOUNTER — Ambulatory Visit: Payer: Self-pay | Admitting: *Deleted

## 2024-07-17 DIAGNOSIS — J309 Allergic rhinitis, unspecified: Secondary | ICD-10-CM

## 2024-07-18 ENCOUNTER — Ambulatory Visit (INDEPENDENT_AMBULATORY_CARE_PROVIDER_SITE_OTHER): Payer: Self-pay | Admitting: Physician Assistant

## 2024-07-18 NOTE — Telephone Encounter (Signed)
 I spoke with Audrey Jenkins about her MRI results, both were normal. Ok to proceed with hearing aids.

## 2024-08-12 ENCOUNTER — Ambulatory Visit: Admitting: Gastroenterology

## 2024-08-12 ENCOUNTER — Encounter: Payer: Self-pay | Admitting: Gastroenterology

## 2024-08-12 VITALS — BP 160/80 | HR 68 | Ht 63.5 in | Wt 223.2 lb

## 2024-08-12 DIAGNOSIS — Z862 Personal history of diseases of the blood and blood-forming organs and certain disorders involving the immune mechanism: Secondary | ICD-10-CM

## 2024-08-12 DIAGNOSIS — K760 Fatty (change of) liver, not elsewhere classified: Secondary | ICD-10-CM | POA: Diagnosis not present

## 2024-08-12 DIAGNOSIS — K219 Gastro-esophageal reflux disease without esophagitis: Secondary | ICD-10-CM

## 2024-08-12 DIAGNOSIS — Z860100 Personal history of colon polyps, unspecified: Secondary | ICD-10-CM

## 2024-08-12 MED ORDER — NA SULFATE-K SULFATE-MG SULF 17.5-3.13-1.6 GM/177ML PO SOLN: 1.0000 | Freq: Once | ORAL | 0 refills | Status: AC

## 2024-08-12 NOTE — Progress Notes (Signed)
 Attending Physician's Attestation   I have reviewed the chart.   I agree with the Advanced Practitioner's note, impression, and recommendations with any updates as below. Agree with plan in Dr. Lafonda absence.   Aloha Finner, MD [facility] Advanced [facility]oscopy Office # [phone]

## 2024-08-12 NOTE — Progress Notes (Signed)
 Chief Complaint:follow-up, recall colonoscopy , EGD Primary GI Doctor:(previously Dr. Eda) Dr Federico  HPI:  Patient is a  68  year old female patient with past medical history of anxiety, depression, dyslipidemia, who presents for a evaluation of colonoscopy and EGD.    12/2023: seen by cardiology, reviewed entire note.   Interval History Patient last seen in GI office on 04/01/20 by Elida, NP.  Patient presents for evaluation of colon screening colonoscopy and EGD  She has history of GERD and uses OTC Tums as needed. She will have some stomach irritation if she takes OTC NSAID's prn. She tried to avoid.No dysphagia. No nausea or vomiting. Appetite good.  Patient denies altered bowel habits, abdominal pain, or rectal bleeding.   Nonsmoker. No alcohol use.  Patient's family history includes: maternal aunt with Breast CA and Brain CA, Maternal GF with stomach CA, mother with endometrial CA  Wt Readings from Last 3 Encounters:  08/12/24 223 lb 4 oz (101.3 kg)  03/04/24 224 lb 3.2 oz (101.7 kg)  01/02/24 235 lb (106.6 kg)    Past Medical History:  Diagnosis Date   Abnormal LFTs    Adenomatous colon polyp    Allergy    Anemia    Anxiety    Depression    Fatty liver    on US  Jan 2011   Headache(784.0)    Hiatal hernia    Hyperlipidemia    Inital 400's   Migraines    Mood disorder    Obesity    Osteoarthritis    Primiparous    Sleep apnea    uses cpap   Snoring    ENT [facility] Jan 2011 nasal obstruction and cryptic tonsils   Tibia fracture     Past Surgical History:  Procedure Laterality Date   BREAST BIOPSY Right 1995   bx   COLONOSCOPY W/ BIOPSIES  04/26/2020   with removal of 8 polyps(polypectomy   dental implant Left 11/2016    Current Outpatient Medications  Medication Sig Dispense Refill   acetaminophen (TYLENOL) 500 MG tablet Take 500-1,000 mg by mouth every 6 (six) hours as needed.     eletriptan  (RELPAX ) 40 MG tablet TAKE 1 TABLET (40 MG TOTAL)  BY MOUTH EVERY 2 (TWO) HOURS AS NEEDED FOR MIGRAINE OR HEADACHE. 9 tablet 1   EPINEPHrine  (AUVI-Q ) 0.3 mg/0.3 mL IJ SOAJ injection Use as directed for severe allergic reactions 1 each 1   escitalopram  (LEXAPRO ) 10 MG tablet Take 1 tablet (10 mg total) by mouth daily. 90 tablet 1   estradiol  (ESTRACE ) 0.1 MG/GM vaginal cream Place 1 Applicatorful vaginally 3 (three) times a week. PRN. Schedule follow up for further refills. [phone] 42.5 g 0   fluticasone  (FLONASE ) 50 MCG/ACT nasal spray Place 2 sprays into both nostrils daily. 48 mL 3   ipratropium (ATROVENT ) 0.06 % nasal spray Place 2 sprays into both nostrils 4 (four) times daily. 15 mL 12   montelukast  (SINGULAIR ) 10 MG tablet TAKE 1 TABLET BY MOUTH EVERY DAY AT NIGHT FOR COUGHING/WHEEZING 90 tablet 1   Na Sulfate-K Sulfate-Mg Sulfate concentrate (SUPREP) 17.5-3.13-1.6 GM/177ML SOLN Take 1 kit (354 mLs total) by mouth once for 1 dose. 354 mL 0   NON FORMULARY 2 injections every week then at 0.50 cc every 2 weeks. Now at 4 wks. Allergy shot.     tiZANidine  (ZANAFLEX ) 4 MG tablet TAKE 1 TABLET (4 MG TOTAL) BY MOUTH AT BEDTIME. AS NEEDED 90 tablet 1   VITAMIN D  PO Take  by mouth.     No current facility-administered medications for this visit.    Allergies as of 08/12/2024 - Review Complete 08/12/2024  Allergen Reaction Noted   Cetirizine hcl  05/22/2008   Codeine  04/26/2018   Lipitor [atorvastatin ] Other (See Comments) 07/20/2023   Pravastatin sodium  07/15/2009   Propoxyphene n-acetaminophen  05/22/2008   Rhinocort [budesonide]  01/04/2018   Zetia  [ezetimibe ]  10/08/2017    Family History  Problem Relation Age of Onset   Arthritis Mother    COPD Mother    Dementia Mother    Endometrial cancer Mother    Skin cancer Mother    Osteoporosis Mother    Other Mother        dementia   Heart attack Father    Hypertension Father        cabd Mi 81 died CHF   Heart failure Father    Heart disease Father    Sleep apnea Sister         improved with weight loss   Allergic rhinitis Sister    Colon polyps Sister    Diabetes Sister    Heart disease Sister    Liver disease Sister    Vasculitis Sister    Osteoporosis Sister    Lung disease Sister    Other Sister        DJD   Heart disease Maternal Grandmother    Stomach cancer Maternal Grandfather    Dementia Paternal Grandmother    Heart disease Paternal Grandfather    Parkinsonism Cousin    Breast cancer Maternal Aunt    Brain cancer Maternal Aunt    Angioedema Neg Hx    Asthma Neg Hx    Eczema Neg Hx    Immunodeficiency Neg Hx    Urticaria Neg Hx    Colon cancer Neg Hx    Esophageal cancer Neg Hx     Review of Systems:    Constitutional: No weight loss, fever, chills, weakness or fatigue HEENT: Eyes: No change in vision               Ears, Nose, Throat:  No change in hearing or congestion Skin: No rash or itching Cardiovascular: No chest pain, chest pressure or palpitations   Respiratory: No SOB or cough Gastrointestinal: See HPI and otherwise negative Genitourinary: No dysuria or change in urinary frequency Neurological: No headache, dizziness or syncope Musculoskeletal: No new muscle or joint pain Hematologic: No bleeding or bruising Psychiatric: No history of depression or anxiety    Physical Exam:  Vital signs: BP (!) 160/80 (BP Location: Left Arm, Patient Position: Sitting, Cuff Size: Normal)   Pulse 68   Ht 5' 3.5 (1.613 m) Comment: height measured without shoes  Wt 223 lb 4 oz (101.3 kg)   BMI 38.93 kg/m   Constitutional:   Pleasant  female appears to be in NAD, Well developed, Well nourished, alert and cooperative Eyes:   PEERL, EOMI. No icterus. Conjunctiva pink. Neck:  Supple Throat: Oral cavity and pharynx without inflammation, swelling or lesion.  Respiratory: Respirations even and unlabored. Lungs clear to auscultation bilaterally.   No wheezes, crackles, or rhonchi.  Cardiovascular: Normal S1, S2. Regular rate and rhythm. No  peripheral edema, cyanosis or pallor.  Gastrointestinal:  Soft, nondistended, nontender. No rebound or guarding. Normal bowel sounds. No appreciable masses or hepatomegaly. Rectal:  Not performed.  Msk:  Symmetrical without gross deformities. Without edema, no deformity or joint abnormality.  Neurologic:  Alert and  oriented  x4;  grossly normal neurologically.  Skin:   Dry and intact without significant lesions or rashes.  RELEVANT LABS AND IMAGING: CBC    Latest Ref Rng & Units 03/04/2024    2:17 PM 03/22/2023    1:00 PM 01/02/2022    3:55 PM  CBC  WBC 4.0 - 10.5 K/uL 8.2  6.7  8.9   Hemoglobin 12.0 - 15.0 g/dL 87.6  87.4  87.5   Hematocrit 36.0 - 46.0 % 37.7  39.2  37.0   Platelets 150.0 - 400.0 K/uL 299.0  296.0  274.0      CMP     Latest Ref Rng & Units 03/04/2024    2:17 PM 03/22/2023    1:00 PM 01/02/2022    3:55 PM  CMP  Glucose 70 - 99 mg/dL 89  88  77   BUN 6 - 23 mg/dL 11  11  16    Creatinine 0.40 - 1.20 mg/dL 9.17  9.29  9.27   Sodium 135 - 145 mEq/L 139  132  138   Potassium 3.5 - 5.1 mEq/L 4.1  3.8  4.1   Chloride 96 - 112 mEq/L 103  98  105   CO2 19 - 32 mEq/L 27  27  24    Calcium  8.4 - 10.5 mg/dL 9.6  9.5  9.3   Total Protein 6.0 - 8.3 g/dL 8.1  7.5  7.5   Total Bilirubin 0.2 - 1.2 mg/dL 0.6  0.6  0.5   Alkaline Phos 39 - 117 U/L 109  123  104   AST 0 - 37 U/L 20  25  18    ALT 0 - 35 U/L 15  31  18       Lab Results  Component Value Date   TSH 1.64 03/04/2024   11/25/2010: AMA normal at 0.54 01/17/2019: GGT 56.  CRP less than 1, vitamin D  23.74, triglycerides 150, platelets 266, sedimentation rate 46 03/18/2018:  ANA positive in a 1-80 homogenous pattern. 07/05/2018: AMA less than 20 04/25/2019: 5 Nucleotidase level 5.  Labs 02/10/2020: Hemoglobin 9.6. Hematocrit 31.0. MCV 75.3. Platelet 353. Labs 02/25/2020: Iron 21. Ferritin 7.9.   Abdominal sonogram 04/25/2019: 1. Diffuse increase in liver echogenicity, a finding indicative of hepatic steatosis. While no focal  liver lesions are evident on this study, it must be cautioned that the sensitivity of ultrasound for detection of focal liver lesions is diminished in this circumstance.   2. Right kidney is significantly smaller than left kidney, a change from prior study. The right kidney is normal in contour without focal renal lesion evident on the right. This size discrepancy raises concern for potential renal artery stenosis on the right. In this regard, question whether patient is hypertensive.   3.  Study otherwise unremarkable.  05/18/20 capsule endoscopy: negative study  03/2020 colonoscopy,recall 3 years - Diverticulosis in the sigmoid colon and in the descending colon. - One 10 mm polyp in the descending colon, removed with a cold snare. Resected and retrieved. - Seven 1 to 4 mm polyps in the rectum, in the descending colon, in the ascending colon and at the ileocecal valve, removed with a cold snare. Resected and retrieved. - These polyps are small and are unllikely to be the cause of anemia.  04/26/20 EGD - Normal esophagus. Biopsied. - Erythematous mucosa in the gastric fundus. - Medium- sized hiatal hernia. - Normal examined duodenum. Biopsied. - The examination was otherwise normal. No source of anemia identified. Await duodenal biopsy results. Path: Diagnosis  1. Surgical [P], duodenal bulb, 2nd portion of duodenum, and distal duodenum - BENIGN DUODENAL MUCOSA. - NO FEATURES OF CELIAC SPRUE, ACTIVE INFLAMMATION OR GRANULOMAS.  2. Surgical [P], fundus, gastric antrum and gastric body - GASTRIC OXYNTIC MUCOSA WITH HYPEREMIA. - WARTHIN-STARRY NEGATIVE FOR HELICOBACTER PYLORI. - NO INTESTINAL METAPLASIA, DYSPLASIA OR CARCINOMA.  3. Surgical [P], mid esophagus and distal esophagus - GASTROESOPHAGEAL AND SQUAMOUS MUCOSA WITH MILD CHRONIC INFLAMMATION CONSISTENT WITH REFLUX. - NO EOSINOPHILIC ESOPHAGITIS. - NO INTESTINAL METAPLASIA, DYSPLASIA OR CARCINOMA.  4. Surgical [P], ileocecal valve, ascending,  descending, polyp (6) - TUBULAR ADENOMA(S). - NO HIGH GRADE DYSPLASIA OR CARCINOMA.  5. Surgical [P], colon, descending, polyp - HYPERPLASTIC POLYP. - NO ADENOMATOUS CHANGE OR CARCINOMA.  6. Surgical [P], colon, rectum, polyp - TUBULAR ADENOMA. - NO HIGH GRADE DYSPLASIA OR CARCINOMA  Assessment: Encounter Diagnoses  Name Primary?   Gastroesophageal reflux disease, unspecified whether esophagitis present Yes   History of colonic polyps    History of iron deficiency anemia    Fatty liver    #1 History of tubular adenomas, last colonoscopy Aug 2021, 3 year recall will schedule colonoscopy today in [facility] with Dr. Federico. #2 History of GERD/dyspepsia, exacerbated with NSAID use. Admits she only uses when deemed necessary.  #3 History of IDA, resolved. #4 history of fatty liver, elevated LFTs. Hepatic functional panel (7/25) normal. No recent imaging.   Plan: -abdominal ultrasound complete -Schedule for a colonoscopy in [facility] with Dr. Federico. The risks and benefits of colonoscopy with possible polypectomy / biopsies were discussed and the patient agrees to proceed.  -Scheduled EGD in [facility] with Dr. Federico. The risks and benefits of EGD with possible biopsies and esophageal dilation were discussed with the patient who agrees to proceed.  Thank you for the courtesy of this consult. Please call me with any questions or concerns.   Bricelyn Freestone, FNP-C [facility] 08/12/2024, 12:27 PM  Cc: Panosh, Wanda K, MD

## 2024-08-12 NOTE — Patient Instructions (Signed)
 We have sent the following medications to your pharmacy for you to pick up at your convenience: SUPREP  You have been scheduled for an endoscopy and colonoscopy. Please follow the written instructions given to you at your visit today.  If you use inhalers (even only as needed), please bring them with you on the day of your procedure.  DO NOT TAKE 7 DAYS PRIOR TO TEST- Trulicity (dulaglutide) Ozempic, Wegovy (semaglutide) Mounjaro, Zepbound (tirzepatide) Bydureon Bcise (exanatide extended release)  DO NOT TAKE 1 DAY PRIOR TO YOUR TEST Rybelsus (semaglutide) Adlyxin (lixisenatide) Victoza (liraglutide) Byetta (exanatide) ___________________________________________________________________________  Due to recent changes in healthcare laws, you may see the results of your imaging and laboratory studies on MyChart before your provider has had a chance to review them.  We understand that in some cases there may be results that are confusing or concerning to you. Not all laboratory results come back in the same time frame and the provider may be waiting for multiple results in order to interpret others.  Please give us  48 hours in order for your provider to thoroughly review all the results before contacting the office for clarification of your results.   _______________________________________________________  If your blood pressure at your visit was 140/90 or greater, please contact your primary care physician to follow up on this.  _______________________________________________________  If you are age 30 or older, your body mass index should be between 23-30. Your Body mass index is 38.93 kg/m. If this is out of the aforementioned range listed, please consider follow up with your Primary Care Provider.  If you are age 21 or younger, your body mass index should be between 19-25. Your Body mass index is 38.93 kg/m. If this is out of the aformentioned range listed, please consider follow up  with your Primary Care Provider.   ________________________________________________________  The [facility] providers would like to encourage you to use MYCHART to communicate with providers for non-urgent requests or questions.  Due to long hold times on the telephone, sending your provider a message by [facility] may be a faster and more efficient way to get a response.  Please allow 48 business hours for a response.  Please remember that this is for non-urgent requests.  _______________________________________________________  [facility] is using a team-based approach to care.  Your team is made up of your doctor and two to three APPS. Our APPS (Nurse Practitioners and Physician Assistants) work with your physician to ensure care continuity for you. They are fully qualified to address your health concerns and develop a treatment plan. They communicate directly with your gastroenterologist to care for you. Seeing the Advanced Practice Practitioners on your physician's team can help you by facilitating care more promptly, often allowing for earlier appointments, access to diagnostic testing, procedures, and other specialty referrals.   Thank you for trusting me with your gastrointestinal care. Deanna May, FNP-C

## 2024-08-13 ENCOUNTER — Telehealth: Payer: Self-pay

## 2024-08-13 NOTE — Telephone Encounter (Signed)
-----   Message from [facility] May, NP sent at 08/12/2024 12:26 PM EST ----- Audrey Jenkins- Her history shows fatty liver , she hasn't had imaging in a while. If she concerns she hasn't had ultrasound in over a year would order complete abd ultrasound to look at liver. Recent labs normal.  Deanna, NP

## 2024-08-24 ENCOUNTER — Other Ambulatory Visit: Payer: Self-pay | Admitting: Internal Medicine

## 2024-08-26 DIAGNOSIS — J301 Allergic rhinitis due to pollen: Secondary | ICD-10-CM | POA: Diagnosis not present

## 2024-08-26 DIAGNOSIS — J3081 Allergic rhinitis due to animal (cat) (dog) hair and dander: Secondary | ICD-10-CM | POA: Diagnosis not present

## 2024-08-26 NOTE — Progress Notes (Signed)
 VIALS MADE ON 08/26/24

## 2024-08-27 ENCOUNTER — Other Ambulatory Visit: Payer: Self-pay | Admitting: Internal Medicine

## 2024-08-27 DIAGNOSIS — J302 Other seasonal allergic rhinitis: Secondary | ICD-10-CM | POA: Diagnosis not present

## 2024-08-27 DIAGNOSIS — J3089 Other allergic rhinitis: Secondary | ICD-10-CM | POA: Diagnosis not present

## 2024-09-09 ENCOUNTER — Ambulatory Visit (INDEPENDENT_AMBULATORY_CARE_PROVIDER_SITE_OTHER): Admitting: Internal Medicine

## 2024-09-09 ENCOUNTER — Encounter: Payer: Self-pay | Admitting: Internal Medicine

## 2024-09-09 ENCOUNTER — Other Ambulatory Visit: Payer: Self-pay | Admitting: Internal Medicine

## 2024-09-09 VITALS — BP 112/76 | HR 76 | Temp 98.0°F | Ht 63.5 in | Wt 223.2 lb

## 2024-09-09 DIAGNOSIS — Z79899 Other long term (current) drug therapy: Secondary | ICD-10-CM | POA: Diagnosis not present

## 2024-09-09 DIAGNOSIS — R030 Elevated blood-pressure reading, without diagnosis of hypertension: Secondary | ICD-10-CM

## 2024-09-09 DIAGNOSIS — E559 Vitamin D deficiency, unspecified: Secondary | ICD-10-CM

## 2024-09-09 DIAGNOSIS — E2839 Other primary ovarian failure: Secondary | ICD-10-CM

## 2024-09-09 DIAGNOSIS — H9192 Unspecified hearing loss, left ear: Secondary | ICD-10-CM

## 2024-09-09 DIAGNOSIS — E785 Hyperlipidemia, unspecified: Secondary | ICD-10-CM | POA: Diagnosis not present

## 2024-09-09 DIAGNOSIS — E78019 Familial hypercholesterolemia, unspecified: Secondary | ICD-10-CM | POA: Diagnosis not present

## 2024-09-09 DIAGNOSIS — G43009 Migraine without aura, not intractable, without status migrainosus: Secondary | ICD-10-CM

## 2024-09-09 MED ORDER — ESTRADIOL 0.01 % VA CREA: TOPICAL_CREAM | VAGINAL | 4 refills | Status: AC

## 2024-09-09 NOTE — Patient Instructions (Signed)
 Good to see  you today   Refilled the  estrace   vaginal cream .  Glad the  head scans are good .  Plan 6 mos  and labs update.  I will place orders

## 2024-09-09 NOTE — Progress Notes (Signed)
 "  Chief Complaint  Patient presents with   Medical Management of Chronic Issues    Pt reports she had brain MRI due to decrease hearing on L from ENT. Would like to discuss with Dr. Charlett.    Medication Refill    Pt request a refill on vaginal cream    HPI: Audrey Jenkins 69 y.o. come in for 6 mos check   HAs  :manageable    ocass use of medication .   Gi  H in check  Beginning some some strength exercises   working on weight loss  leary of glp1 meds at this time  HLD   at goal at last check  seen dr Mona at lipid clinic in May  Bp up and down usually better  Doesn't do mammogram but to have colon screen soon   ROS: See pertinent positives and negatives per HPI. No cp sob falling   Past Medical History:  Diagnosis Date   Abnormal LFTs    Adenomatous colon polyp    Allergy    Anemia    Anxiety    Depression    Fatty liver    on US  Jan 2011   Headache(784.0)    Hiatal hernia    Hyperlipidemia    Inital 400's   Migraines    Mood disorder    Obesity    Osteoarthritis    Primiparous    Sleep apnea    uses cpap   Snoring    ENT Ethyl Jan 2011 nasal obstruction and cryptic tonsils   Tibia fracture     Family History  Problem Relation Age of Onset   Arthritis Mother    COPD Mother    Dementia Mother    Endometrial cancer Mother    Skin cancer Mother    Osteoporosis Mother    Other Mother        dementia   Heart attack Father    Hypertension Father        cabd Mi 48 died CHF   Heart failure Father    Heart disease Father    Sleep apnea Sister        improved with weight loss   Allergic rhinitis Sister    Colon polyps Sister    Diabetes Sister    Heart disease Sister    Liver disease Sister    Vasculitis Sister    Osteoporosis Sister    Lung disease Sister    Other Sister        DJD   Heart disease Maternal Grandmother    Stomach cancer Maternal Grandfather    Dementia Paternal Grandmother    Heart disease Paternal Grandfather    Parkinsonism  Cousin    Breast cancer Maternal Aunt    Brain cancer Maternal Aunt    Angioedema Neg Hx    Asthma Neg Hx    Eczema Neg Hx    Immunodeficiency Neg Hx    Urticaria Neg Hx    Colon cancer Neg Hx    Esophageal cancer Neg Hx     Social History   Socioeconomic History   Marital status: Married    Spouse name: Not on file   Number of children: 0   Years of education: Not on file   Highest education level: Master's degree (e.g., MA, MS, MEng, MEd, MSW, MBA)  Occupational History   Occupation: retired  Tobacco Use   Smoking status: Never   Smokeless tobacco: Never  Vaping Use   Vaping  status: Never Used  Substance and Sexual Activity   Alcohol use: Not Currently    Alcohol/week: 0.0 standard drinks of alcohol    Comment: socially   Drug use: No   Sexual activity: Not Currently  Other Topics Concern   Not on file  Social History Narrative   Married   HH of 2   Pet Kittens   Works with Architectural Technologist    employed [employer]  Teaches  Full time    6 cats  Rescue some older    Neg tobacco ets rare etoh         Social Drivers of Health   Tobacco Use: Low Risk (09/09/2024)   Patient History    Smoking Tobacco Use: Never    Smokeless Tobacco Use: Never    Passive Exposure: Not on file  Financial Resource Strain: Low Risk (08/08/2023)   Overall Financial Resource Strain (CARDIA)    Difficulty of Paying Living Expenses: Not hard at all  Food Insecurity: No Food Insecurity (03/20/2023)   Hunger Vital Sign    Worried About Running Out of Food in the Last Year: Never true    Ran Out of Food in the Last Year: Never true  Transportation Needs: No Transportation Needs (03/20/2023)   PRAPARE - Administrator, Civil Service (Medical): No    Lack of Transportation (Non-Medical): No  Physical Activity: Insufficiently Active (08/07/2023)   Exercise Vital Sign    Days of Exercise per Week: 1 day    Minutes of Exercise per Session: 30 min  Stress:  Stress Concern Present (08/07/2023)   [employer] of Occupational Health - Occupational Stress Questionnaire    Feeling of Stress : Very much  Social Connections: Moderately Integrated (08/08/2023)   Social Connection and Isolation Panel    Frequency of Communication with Friends and Family: More than three times a week    Frequency of Social Gatherings with Friends and Family: Never    Attends Religious Services: Never    Database Administrator or Organizations: Yes    Attends Engineer, Structural: More than 4 times per year    Marital Status: Married  Depression (PHQ2-9): Medium Risk (09/09/2024)   Depression (PHQ2-9)    PHQ-2 Score: 6  Alcohol Screen: Low Risk (08/07/2023)   Alcohol Screen    Last Alcohol Screening Score (AUDIT): 0  Housing: Low Risk (03/20/2023)   Housing    Last Housing Risk Score: 0  Utilities: Not on file  Health Literacy: Not on file    Outpatient Medications Prior to Visit  Medication Sig Dispense Refill   acetaminophen (TYLENOL) 500 MG tablet Take 500-1,000 mg by mouth every 6 (six) hours as needed.     eletriptan  (RELPAX ) 40 MG tablet TAKE 1 TABLET (40 MG TOTAL) BY MOUTH EVERY 2 (TWO) HOURS AS NEEDED FOR MIGRAINE OR HEADACHE. 9 tablet 1   EPINEPHrine  (AUVI-Q ) 0.3 mg/0.3 mL IJ SOAJ injection Use as directed for severe allergic reactions 1 each 1   escitalopram  (LEXAPRO ) 10 MG tablet TAKE 1 TABLET BY MOUTH EVERY DAY 90 tablet 1   fluticasone  (FLONASE ) 50 MCG/ACT nasal spray Place 2 sprays into both nostrils daily. (Patient taking differently: Place 1 spray into both nostrils daily.) 48 mL 3   ipratropium (ATROVENT ) 0.06 % nasal spray Place 2 sprays into both nostrils 4 (four) times daily. (Patient taking differently: Place 2 sprays into both nostrils 4 (four) times daily. Prn) 15 mL 12  montelukast  (SINGULAIR ) 10 MG tablet TAKE 1 TABLET BY MOUTH EVERY DAY AT NIGHT FOR COUGHING/WHEEZING 90 tablet 1   NON FORMULARY 2 injections every week then at  0.50 cc every 2 weeks. Now at 4 wks. Allergy shot.     tiZANidine  (ZANAFLEX ) 4 MG tablet TAKE 1 TABLET (4 MG TOTAL) BY MOUTH AT BEDTIME. AS NEEDED 90 tablet 1   VITAMIN D  PO Take by mouth. (Patient taking differently: Take 1,000 Units by mouth.)     estradiol  (ESTRACE ) 0.1 MG/GM vaginal cream Place 1 Applicatorful vaginally 3 (three) times a week. PRN. Schedule follow up for further refills. [phone] 42.5 g 0   No facility-administered medications prior to visit.     EXAM:  BP 112/76 (BP Location: Right Arm, Patient Position: Sitting, Cuff Size: Large)   Pulse 76   Temp 98 F (36.7 C) (Oral)   Ht 5' 3.5 (1.613 m)   Wt 223 lb 3.2 oz (101.2 kg)   SpO2 96%   BMI 38.92 kg/m   Body mass index is 38.92 kg/m.  BP Readings from Last 3 Encounters:  09/09/24 112/76  08/12/24 (!) 160/80  06/26/24 135/78   Wt Readings from Last 3 Encounters:  09/09/24 223 lb 3.2 oz (101.2 kg)  08/12/24 223 lb 4 oz (101.3 kg)  03/04/24 224 lb 3.2 oz (101.7 kg)     GENERAL: vitals reviewed and list.lastbped above, alert, oriented, appears well hydrated and in no acute distress HEENT: atraumatic, conjunctiva  clear, no obvious abnormalities on inspection of external nose and ears NECK: no obvious masses on inspection palpation  LUNGS: clear to auscultation bilaterally, no wheezes, rales or rhonchi, good air movement CV: HRRR, no clubbing cyanosis or  peripheral edema nl cap refill  Abdomen:  Sof,t normal bowel sounds without hepatosplenomegaly, no guarding rebound or masses no CVA tenderness MS: moves all extremities without noticeable focal  abnormality PSYCH: pleasant and cooperative, no obvious depression or anxiety Lab Results  Component Value Date   WBC 8.2 03/04/2024   HGB 12.3 03/04/2024   HCT 37.7 03/04/2024   PLT 299.0 03/04/2024   GLUCOSE 89 03/04/2024   CHOL 147 03/04/2024   TRIG 147.0 03/04/2024   HDL 64.50 03/04/2024   LDLDIRECT 157.4 02/19/2014   LDLCALC 53 03/04/2024   ALT  15 03/04/2024   AST 20 03/04/2024   NA 139 03/04/2024   K 4.1 03/04/2024   CL 103 03/04/2024   CREATININE 0.82 03/04/2024   BUN 11 03/04/2024   CO2 27 03/04/2024   TSH 1.64 03/04/2024   HGBA1C 5.6 03/04/2024   MICROALBUR <0.7 03/04/2024   BP Readings from Last 3 Encounters:  09/09/24 112/76  08/12/24 (!) 160/80  06/26/24 135/78    ASSESSMENT AND PLAN:  Discussed the following assessment and plan:  Decreased hearing of left ear  Hyperlipidemia, unspecified hyperlipidemia type - last check at goal in summer  Medication management  Familial hypercholesterolemia, unspecified type  Vitamin D  deficiency Doing better   trying some  home exercise to be more active  No cv pulm sx  Mood  stable   working on family paperwork and feels will do better after that  Reveiwed   head scans  mri mra  favorable  She is considering hearing aids left ear  Mood seems  stable some improvement  Advise  cont weight adjustment healthy eating  activity  -Patient advised to return or notify health care team  if  new concerns arise.  Patient Instructions  Good to  see  you today   Refilled the  estrace   vaginal cream .  Glad the  head scans are good .  Plan 6 mos  and labs update.  I will place orders       Maryn Freelove K. Raad Clayson M.D. "

## 2024-09-12 ENCOUNTER — Ambulatory Visit (INDEPENDENT_AMBULATORY_CARE_PROVIDER_SITE_OTHER)

## 2024-09-12 DIAGNOSIS — J302 Other seasonal allergic rhinitis: Secondary | ICD-10-CM

## 2024-09-23 ENCOUNTER — Ambulatory Visit

## 2024-09-23 ENCOUNTER — Encounter: Payer: Self-pay | Admitting: Internal Medicine

## 2024-09-23 ENCOUNTER — Ambulatory Visit: Admitting: Internal Medicine

## 2024-09-23 VITALS — BP 124/70 | HR 73 | Temp 98.6°F | Resp 20 | Wt 225.0 lb

## 2024-09-23 DIAGNOSIS — J302 Other seasonal allergic rhinitis: Secondary | ICD-10-CM

## 2024-09-23 DIAGNOSIS — J3089 Other allergic rhinitis: Secondary | ICD-10-CM | POA: Diagnosis not present

## 2024-09-23 MED ORDER — IPRATROPIUM BROMIDE 0.06 % NA SOLN: 2.0000 | Freq: Four times a day (QID) | NASAL | 12 refills | Status: AC

## 2024-09-23 MED ORDER — MONTELUKAST SODIUM 10 MG PO TABS: ORAL_TABLET | ORAL | 1 refills | Status: AC

## 2024-09-23 NOTE — Patient Instructions (Addendum)
 1. Seasonal and perennial allergic rhinitis, well controlled  - Continue avoidance measures  - Continue with allergy shots at the same schedule. - Continue with Flonase  one spray per nostril daily as needed  - Continue with Allegra one tablet daily as needed  - Continue with Singulair  (montelukast ) 10mg  daily.  - Continue atrovent  nose spray one spray per nostril as needed for runny nose   2. Follow up in 1 year    Please inform us  of any Emergency Department visits, hospitalizations, or changes in symptoms. Call us  before going to the ED for breathing or allergy symptoms since we might be able to fit you in for a sick visit. Feel free to contact us  anytime with any questions, problems, or concerns.  It was a pleasure to see you again today!

## 2024-09-23 NOTE — Progress Notes (Signed)
 "  Follow Up Note  RE: Audrey Jenkins MRN: [id number] DOB: 09-04-1955 Date of Office Visit: 09/23/2024  Referring provider: Charlett Apolinar POUR, MD Primary care provider: Charlett Apolinar POUR, MD  Chief Complaint: Allergic Rhinitis   History of Present Illness: I had the pleasure of seeing Audrey Jenkins for a follow up visit at the [facility] on 09/23/2024. She is a 69 y.o. female, who is being followed for allergic rhinitis on AIT. Her previous allergy office visit was on 07/16/23 with Dr. Lorin. Today is a regular follow up visit.  History obtained from patient  and chart review .  Allergic Rhinitis - Medical therapy:singulair  10 mg, flonase  1 spray per nostril daily,  atovent 1 spray as needed (works wonder for rhinorhea per patient); not needing allegra, wondering if she can step down flonase  to as needed  - Adverse effects of medications: denies  - Immunotherapy: Vial 1 ( molds and dust mites).  Vial 2 ( trees, weeds, grasses, cat and dog) Last injection was 0.50mL of the RED vial (1/100). She started shots September of 2020 and reached maintenance in May of 2021.   - Large Local Reactions: Denies  - Systemic Reactions: Denies  - Beta Blockers: Denies  - History of Reflux mild reflx  - History of Sinus Surgery denies   She is very pleased with response to AIT.  Needs refills of montelukast , atrovent .   Assessment and Plan: Audrey Jenkins is a 69 y.o. female with: No diagnosis found. Plan: Patient Instructions  1. Seasonal and perennial allergic rhinitis, well controlled  - Continue avoidance measures  - Continue with allergy shots at the same schedule. - Continue with Flonase  one spray per nostril daily as needed  - Continue with Allegra one tablet daily as needed  - Continue with Singulair  (montelukast ) 10mg  daily.  - Continue atrovent  nose spray one spray per nostril as needed for runny nose   2. Follow up in 1 year    Please inform us  of any Emergency Department  visits, hospitalizations, or changes in symptoms. Call us  before going to the ED for breathing or allergy symptoms since we might be able to fit you in for a sick visit. Feel free to contact us  anytime with any questions, problems, or concerns.  It was a pleasure to see you again today!   No follow-ups on file.  Meds ordered this encounter  Medications   montelukast  (SINGULAIR ) 10 MG tablet    Sig: TAKE 1 TABLET BY MOUTH EVERY DAY AT NIGHT FOR COUGHING/WHEEZING    Dispense:  90 tablet    Refill:  1   ipratropium (ATROVENT ) 0.06 % nasal spray    Sig: Place 2 sprays into both nostrils 4 (four) times daily.    Dispense:  15 mL    Refill:  12    Lab Orders  No laboratory test(s) ordered today   Diagnostics: None performed    Medication List:  Current Outpatient Medications  Medication Sig Dispense Refill   acetaminophen (TYLENOL) 500 MG tablet Take 500-1,000 mg by mouth every 6 (six) hours as needed.     eletriptan  (RELPAX ) 40 MG tablet TAKE 1 TABLET (40 MG TOTAL) BY MOUTH EVERY 2 (TWO) HOURS AS NEEDED FOR MIGRAINE OR HEADACHE. 9 tablet 1   EPINEPHrine  (AUVI-Q ) 0.3 mg/0.3 mL IJ SOAJ injection Use as directed for severe allergic reactions 1 each 1   escitalopram  (LEXAPRO ) 10 MG tablet TAKE 1 TABLET BY MOUTH EVERY DAY 90 tablet  1   estradiol  (ESTRACE ) 0.01 % CREA vaginal cream Use 1 -gram  3 x per week or as directed 42.5 g 4   fluticasone  (FLONASE ) 50 MCG/ACT nasal spray Place 2 sprays into both nostrils daily. 48 mL 3   NON FORMULARY 2 injections every week then at 0.50 cc every 2 weeks. Now at 4 wks. Allergy shot.     tiZANidine  (ZANAFLEX ) 4 MG tablet TAKE 1 TABLET (4 MG TOTAL) BY MOUTH AT BEDTIME. AS NEEDED 90 tablet 1   VITAMIN D  PO Take by mouth.     ipratropium (ATROVENT ) 0.06 % nasal spray Place 2 sprays into both nostrils 4 (four) times daily. 15 mL 12   montelukast  (SINGULAIR ) 10 MG tablet TAKE 1 TABLET BY MOUTH EVERY DAY AT NIGHT FOR COUGHING/WHEEZING 90 tablet 1   No  current facility-administered medications for this visit.   Allergies: Allergies  Allergen Reactions   Cetirizine Hcl     REACTION: flu symptoms   Codeine    Lipitor [Atorvastatin ] Other (See Comments)    Muscle aches, soreness in legs   Pravastatin Sodium     REACTION: Diarrhea   Propoxyphene N-Acetaminophen     REACTION: extreme nausea   Rhinocort [Budesonide]     Nose sores   Zetia  [Ezetimibe ]     Body aches, and sick feeling   I reviewed her past medical history, social history, family history, and environmental history and no significant changes have been reported from her previous visit.  ROS: All others negative except as noted per HPI.   Objective: BP 124/70 (BP Location: Left Arm, Patient Position: Sitting, Cuff Size: Large)   Pulse 73   Temp 98.6 F (37 C) (Oral)   Resp 20   Wt 225 lb (102.1 kg)   SpO2 96%   BMI 39.23 kg/m  Body mass index is 39.23 kg/m. General Appearance:  Alert, cooperative, no distress, appears stated age  Head:  Normocephalic, without obvious abnormality, atraumatic  Eyes:  Conjunctiva clear, EOM's intact  Nose: Nares normal, normal mucosa, no visible anterior polyps, and septum midline  Throat: Lips, tongue normal; teeth and gums normal, normal posterior oropharynx  Neck: Supple, symmetrical  Lungs:   clear to auscultation bilaterally, Respirations unlabored, no coughing  Heart:  regular rate and rhythm and no murmur, Appears well perfused  Extremities: No edema  Skin: Skin color, texture, turgor normal, no rashes or lesions on visualized portions of skin  Neurologic: No gross deficits   Previous notes and tests were reviewed. The plan was reviewed with the patient/family, and all questions/concerned were addressed.  It was my pleasure to see Audrey Jenkins today and participate in her care. Please feel free to contact me with any questions or concerns.  Sincerely,  Hargis Springer, MD  Allergy & Immunology  [facility]  [place] Office: [phone] "

## 2024-09-29 ENCOUNTER — Other Ambulatory Visit: Payer: Self-pay | Admitting: Internal Medicine

## 2024-10-17 ENCOUNTER — Encounter: Admitting: Internal Medicine

## 2025-03-10 ENCOUNTER — Ambulatory Visit: Admitting: Internal Medicine
